# Patient Record
Sex: Female | Born: 1970 | Race: White | Hispanic: No | Marital: Married | Smoking: Never smoker
Health system: Southern US, Community
[De-identification: ages and names within clinical notes are randomized; demographics above are authoritative.]

## PROBLEM LIST (undated history)

## (undated) DIAGNOSIS — T7840XA Allergy, unspecified, initial encounter: Secondary | ICD-10-CM

## (undated) HISTORY — DX: Allergy, unspecified, initial encounter: T78.40XA

---

## 1998-08-30 ENCOUNTER — Other Ambulatory Visit: Admission: RE | Admit: 1998-08-30 | Discharge: 1998-08-30 | Payer: Self-pay | Admitting: *Deleted

## 1998-09-06 ENCOUNTER — Inpatient Hospital Stay (HOSPITAL_COMMUNITY): Admission: AD | Admit: 1998-09-06 | Discharge: 1998-09-06 | Payer: Self-pay | Admitting: Obstetrics & Gynecology

## 1998-11-30 ENCOUNTER — Ambulatory Visit (HOSPITAL_COMMUNITY): Admission: RE | Admit: 1998-11-30 | Discharge: 1998-11-30 | Payer: Self-pay | Admitting: Obstetrics and Gynecology

## 1998-11-30 ENCOUNTER — Encounter: Payer: Self-pay | Admitting: Obstetrics and Gynecology

## 1999-01-14 ENCOUNTER — Inpatient Hospital Stay (HOSPITAL_COMMUNITY): Admission: AD | Admit: 1999-01-14 | Discharge: 1999-01-14 | Payer: Self-pay | Admitting: Obstetrics and Gynecology

## 1999-01-15 ENCOUNTER — Inpatient Hospital Stay (HOSPITAL_COMMUNITY): Admission: AD | Admit: 1999-01-15 | Discharge: 1999-01-15 | Payer: Self-pay | Admitting: Obstetrics and Gynecology

## 1999-01-17 ENCOUNTER — Observation Stay (HOSPITAL_COMMUNITY): Admission: AD | Admit: 1999-01-17 | Discharge: 1999-01-18 | Payer: Self-pay | Admitting: Obstetrics & Gynecology

## 1999-01-21 ENCOUNTER — Encounter (HOSPITAL_COMMUNITY): Admission: RE | Admit: 1999-01-21 | Discharge: 1999-03-11 | Payer: Self-pay | Admitting: Obstetrics and Gynecology

## 1999-01-21 ENCOUNTER — Inpatient Hospital Stay (HOSPITAL_COMMUNITY): Admission: AD | Admit: 1999-01-21 | Discharge: 1999-01-21 | Payer: Self-pay | Admitting: Obstetrics and Gynecology

## 1999-01-30 ENCOUNTER — Encounter: Payer: Self-pay | Admitting: Obstetrics and Gynecology

## 1999-02-12 ENCOUNTER — Encounter: Payer: Self-pay | Admitting: Obstetrics and Gynecology

## 1999-03-09 ENCOUNTER — Inpatient Hospital Stay (HOSPITAL_COMMUNITY): Admission: AD | Admit: 1999-03-09 | Discharge: 1999-03-12 | Payer: Self-pay | Admitting: Obstetrics & Gynecology

## 1999-03-12 ENCOUNTER — Encounter (HOSPITAL_COMMUNITY): Admission: RE | Admit: 1999-03-12 | Discharge: 1999-06-10 | Payer: Self-pay | Admitting: Obstetrics & Gynecology

## 2004-12-22 HISTORY — PX: BREAST REDUCTION SURGERY: SHX8

## 2006-09-10 ENCOUNTER — Other Ambulatory Visit: Admission: RE | Admit: 2006-09-10 | Discharge: 2006-09-10 | Payer: Self-pay | Admitting: Obstetrics and Gynecology

## 2006-10-02 ENCOUNTER — Other Ambulatory Visit: Admission: RE | Admit: 2006-10-02 | Discharge: 2006-10-02 | Payer: Self-pay | Admitting: Obstetrics and Gynecology

## 2015-08-10 ENCOUNTER — Other Ambulatory Visit: Payer: Self-pay | Admitting: Orthopedic Surgery

## 2015-08-10 DIAGNOSIS — M6740 Ganglion, unspecified site: Secondary | ICD-10-CM

## 2015-08-24 ENCOUNTER — Other Ambulatory Visit: Payer: Self-pay

## 2015-09-22 ENCOUNTER — Emergency Department (INDEPENDENT_AMBULATORY_CARE_PROVIDER_SITE_OTHER)
Admission: EM | Admit: 2015-09-22 | Discharge: 2015-09-22 | Disposition: A | Discharge status: Home / Self Care | Payer: BLUE CROSS/BLUE SHIELD | Source: Home / Self Care | Attending: Family Medicine | Admitting: Family Medicine

## 2015-09-22 ENCOUNTER — Encounter (HOSPITAL_COMMUNITY): Payer: Self-pay | Admitting: *Deleted

## 2015-09-22 DIAGNOSIS — S0083XA Contusion of other part of head, initial encounter: Secondary | ICD-10-CM

## 2015-09-22 NOTE — ED Notes (Signed)
Pt    Reports       She  Was  Struck  Head  By  [employer]    While  Doing  Trimming  Today  -      No  Visible  Marks      No  Loss  Of  concoussnesss     No  Vomiting    Pearla    Awake  And  Alert

## 2015-09-22 NOTE — Discharge Instructions (Signed)
Ice, tylenol or advil for soreness as needed, return as needed.

## 2015-09-22 NOTE — ED Provider Notes (Signed)
CSN: [id number]     Arrival date & time 09/22/15  1949 History   First MD Initiated Contact with Patient 09/22/15 2023     Chief Complaint  Patient presents with  . Head Injury   (Consider location/radiation/quality/duration/timing/severity/associated sxs/prior Treatment) Patient is a 44 y.o. female presenting with facial injury.  Facial Injury Mechanism of injury:  Direct blow (trimming limbs and pt let go of pole to catch limb and pole struck side of face, no neuro sx.) Location:  Forehead and L cheek Time since incident:  4 hours Pain details:    Quality:  Dull   Severity:  Mild   Progression:  Unchanged Chronicity:  New Foreign body present:  No foreign bodies Relieved by:  None tried Worsened by:  Nothing tried Ineffective treatments:  None tried Associated symptoms: no double vision, no headaches, no loss of consciousness, no nausea and no vomiting   Risk factors: no concern for non-accidental trauma     History reviewed. No pertinent past medical history. Past Surgical History  Procedure Laterality Date  . Cesarean section     History reviewed. No pertinent family history. Social History  Substance Use Topics  . Smoking status: Never Smoker   . Smokeless tobacco: None  . Alcohol Use: Yes   OB History    No data available     Review of Systems  Constitutional: Negative.   HENT: Negative.   Eyes: Negative for double vision.  Gastrointestinal: Negative for nausea and vomiting.  Skin: Negative for color change, rash and wound.  Neurological: Negative.  Negative for loss of consciousness and headaches.  All other systems reviewed and are negative.   Allergies  Dust mite mixed allergen ext  Home Medications   Prior to Admission medications   Not on File   Meds Ordered and Administered this Visit  Medications - No data to display  BP 121/73 mmHg  Pulse 63  Temp(Src) 98.2 F (36.8 C) (Oral)  Resp 18  SpO2 99%  LMP 09/12/2015 No data  found.   Physical Exam  Constitutional: She is oriented to person, place, and time. She appears well-developed and well-nourished. No distress.  HENT:  Head: Normocephalic and atraumatic.  Right Ear: External ear normal.  Left Ear: External ear normal.  Eyes: Conjunctivae are normal. Pupils are equal, round, and reactive to light.  Neck: Normal range of motion. Neck supple.  Neurological: She is alert and oriented to person, place, and time. She displays normal reflexes. No cranial nerve deficit. Coordination normal.  Skin: Skin is warm and dry. No rash noted. No erythema.  Nursing note and vitals reviewed.   ED Course  Procedures (including critical care time)  Labs Review Labs Reviewed - No data to display  Imaging Review No results found.   Visual Acuity Review  Right Eye Distance:   Left Eye Distance:   Bilateral Distance:    Right Eye Near:   Left Eye Near:    Bilateral Near:         MDM   1. Contusion of forehead, initial encounter        Linna Hoff, MD 09/22/15 2044

## 2016-01-20 ENCOUNTER — Ambulatory Visit (INDEPENDENT_AMBULATORY_CARE_PROVIDER_SITE_OTHER): Payer: BLUE CROSS/BLUE SHIELD | Admitting: Family Medicine

## 2016-01-20 VITALS — BP 120/80 | HR 77 | Temp 98.3°F | Resp 17 | Ht 63.39 in | Wt 139.0 lb

## 2016-01-20 DIAGNOSIS — J0140 Acute pansinusitis, unspecified: Secondary | ICD-10-CM

## 2016-01-20 DIAGNOSIS — J309 Allergic rhinitis, unspecified: Secondary | ICD-10-CM | POA: Diagnosis not present

## 2016-01-20 DIAGNOSIS — R05 Cough: Secondary | ICD-10-CM

## 2016-01-20 DIAGNOSIS — R059 Cough, unspecified: Secondary | ICD-10-CM

## 2016-01-20 MED ORDER — AMOXICILLIN-POT CLAVULANATE 875-125 MG PO TABS: 1.0000 | ORAL_TABLET | Freq: Two times a day (BID) | ORAL | Status: DC

## 2016-01-20 NOTE — Patient Instructions (Signed)

## 2016-01-20 NOTE — Progress Notes (Signed)
      Chief Complaint:  Chief Complaint  Patient presents with  . Sinusitis    HPI: Ashley Brandt is a 45 y.o. female who reports to [facility] today complaining of sinusitis sxs.  Has allergies. No fevers chills, CP or SOB  Has tried otc meds without releif.   Past Medical History  Diagnosis Date  . Allergy    Past Surgical History  Procedure Laterality Date  . Cesarean section     Social History   Social History  . Marital Status: Married    Spouse Name: N/A  . Number of Children: N/A  . Years of Education: N/A   Social History Main Topics  . Smoking status: Never Smoker   . Smokeless tobacco: None  . Alcohol Use: Yes  . Drug Use: No  . Sexual Activity: No   Other Topics Concern  . None   Social History Narrative   History reviewed. No pertinent family history. Allergies  Allergen Reactions  . Dust Mite Mixed Allergen Ext [Mite (D. Farinae)]    Prior to Admission medications   Not on File     ROS: The patient denies fevers, chills, night sweats, unintentional weight loss, chest pain, palpitations, wheezing, dyspnea on exertion, nausea, vomiting, abdominal pain, dysuria, hematuria, melena, numbness, weakness, or tingling.   All other systems have been reviewed and were otherwise negative with the exception of those mentioned in the HPI and as above.    PHYSICAL EXAM: Filed Vitals:   01/20/16 1128  BP: 120/80  Pulse: 77  Temp: 98.3 F (36.8 C)  Resp: 17   Body mass index is 24.32 kg/(m^2).   General: Alert, no acute distress HEENT:  Normocephalic, atraumatic, oropharynx patent. EOMI, PERRLA Erythematous throat, no exudates, TM normal, + sinus tenderness, + erythematous/boggy nasal mucosa Cardiovascular:  Regular rate and rhythm, no rubs murmurs or gallops.  No Carotid bruits, radial pulse intact. No pedal edema.  Respiratory: Clear to auscultation bilaterally.  No wheezes, rales, or rhonchi.  No cyanosis, no use of accessory  musculature Abdominal: No organomegaly, abdomen is soft and non-tender, positive bowel sounds. No masses. Skin: No rashes. Neurologic: Facial musculature symmetric. Psychiatric: Patient acts appropriately throughout our interaction. Lymphatic: No cervical or submandibular lymphadenopathy Musculoskeletal: Gait intact. No edema, tenderness   LABS: No results found for this or any previous visit.   EKG/XRAY:   Primary read interpreted by Dr. Conley Rolls at [facility].   ASSESSMENT/PLAN: Encounter Diagnoses  Name Primary?  . Allergic rhinitis, unspecified allergic rhinitis type   . Acute pansinusitis, recurrence not specified Yes  . Cough    otc cough meds Rx augmentin, fu prn   Gross sideeffects, risk and benefits, and alternatives of medications d/w patient. Patient is aware that all medications have potential sideeffects and we are unable to predict every sideeffect or drug-drug interaction that may occur.  Regan Llorente DO  01/20/2016 1:40 PM

## 2020-02-14 ENCOUNTER — Other Ambulatory Visit: Payer: Self-pay

## 2020-02-14 ENCOUNTER — Ambulatory Visit (INDEPENDENT_AMBULATORY_CARE_PROVIDER_SITE_OTHER): Payer: BC Managed Care – PPO | Admitting: Allergy & Immunology

## 2020-02-14 ENCOUNTER — Encounter: Payer: Self-pay | Admitting: Allergy & Immunology

## 2020-02-14 VITALS — BP 114/62 | HR 69 | Temp 97.9°F | Resp 18 | Ht 63.0 in | Wt 148.2 lb

## 2020-02-14 DIAGNOSIS — R059 Cough, unspecified: Secondary | ICD-10-CM

## 2020-02-14 DIAGNOSIS — R05 Cough: Secondary | ICD-10-CM | POA: Diagnosis not present

## 2020-02-14 DIAGNOSIS — K9049 Malabsorption due to intolerance, not elsewhere classified: Secondary | ICD-10-CM

## 2020-02-14 DIAGNOSIS — J302 Other seasonal allergic rhinitis: Secondary | ICD-10-CM | POA: Diagnosis not present

## 2020-02-14 DIAGNOSIS — J3089 Other allergic rhinitis: Secondary | ICD-10-CM | POA: Diagnosis not present

## 2020-02-14 MED ORDER — ALBUTEROL SULFATE HFA 108 (90 BASE) MCG/ACT IN AERS: 2.0000 | INHALATION_SPRAY | Freq: Four times a day (QID) | RESPIRATORY_TRACT | 1 refills | Status: DC | PRN

## 2020-02-14 MED ORDER — AZELASTINE HCL 0.1 % NA SOLN: 2.0000 | Freq: Two times a day (BID) | NASAL | 2 refills | Status: DC | PRN

## 2020-02-14 NOTE — Progress Notes (Signed)
NEW PATIENT  Date of Service/Encounter:  02/14/20  Referring provider: Tally Joe, MD   Assessment:   Cough - likely secondary to postnasal drip  Seasonal and perennial allergic rhinitis (ragweed, weeds, trees and cockroach)  Food intolerance - with negative testing to the most common foods  Plan/Recommendations:   1. Cough - Spirometry looked great today. - I think a lot of your coughing is from postnasal drip. - You can continue to use albuterol 2-4 puffs every 4 hours as needed.  - We can consider adding on a daily asthma medication in the future, if needed. - But we are going to be aggressive with the postnasal drip at this time to see if this helps.   2. Seasonal and perennial allergic rhinitis - Testing today showed: ragweed, weeds, trees and cockroach - Copy of test results provided.  - Avoidance measures provided. - Stop taking: Claritin - Continue with: Flonase (fluticasone) two sprays per nostril daily - Start taking: Xyzal (levocetirizine) 5mg  tablet once daily and Astelin (azelastine) 2 sprays per nostril 1-2 times daily as needed - You can use an extra dose of the antihistamine, if needed, for breakthrough symptoms.  - Consider nasal saline rinses 1-2 times daily to remove allergens from the nasal cavities as well as help with mucous clearance (this is especially helpful to do before the nasal sprays are given) - Consider allergy shots as a means of long-term control. - Allergy shots "re-train" and "reset" the immune system to ignore environmental allergens and decrease the resulting immune response to those allergens (sneezing, itchy watery eyes, runny nose, nasal congestion, etc).    - Allergy shots improve symptoms in 75-85% of patients.  - We can discuss more at the next appointment if the medications are not wor king for you.  3. Food intolerance - Testing to the most common foods was completely negative. - There is a the low positive predictive value  of food allergy testing and hence the high possibility of false positives. - In contrast, food allergy testing has a high negative predictive value, therefore if testing is negative we can be relatively assured that they are indeed negative.  - You can avoid the grains since you feel better without them, but you do not have to worry about needing an EpiPen.   4. Return in about 3 months (around 05/13/2020). This can be an in-person, a virtual Webex or a telephone follow up visit.  Subjective:   Ashley Brandt is a 49 y.o. female presenting today for evaluation of  Chief Complaint  Patient presents with  . Food Intolerance    unknown source  . Allergic Rhinitis     Molds, DM  . Cough    during the winter times     Ashley Brandt has a history of the following: Patient Active Problem List   Diagnosis Date Noted  . Seasonal and perennial allergic rhinitis 02/14/2020  . Food intolerance 02/14/2020    History obtained from: chart review and patient.  02/16/2020 Ashley Brandt was referred by Gar Ponto, MD.     Ashley Brandt is a 49 y.o. female presenting for an evaluation of allergies and asthma.   Asthma/Respiratory Symptom History: She has never been diagnosed with asthma, although her dad was diagnosed with it. She has never needed an inhaler for asthma, but she has gotten in every winter for "bronchitis". This is an annual episode and she gets albuterol for this.   Allergic Rhinitis Symptom History: She was  on shots for years when she was a child. She has taken Zyrtec for years. She is now on the Claritin. Prior to this, she was on the Chlortrimetron. She stopped the Claritin before this appointment and the first day that she stopped, she apparently developed a lot of eye watering. She has never been on Allegra or Xyzal. She is on a nose spray and started fluticasone last month. She is not using eye drops at all. Her last testing was done when she was very young.   Food Allergy  Symptom History: She reports that she was tested often for foods when she was a kid. She reports that she had a lot of "mild allergies". She was then tested 15 years ago and nothing "jumped off the charts". She thinks that she was positive to wheat. She tells me that 6 years ago, she cut out grains as well as gluten. When the pandemic hit, she added the grains back into her diet. She reports that leading a gluten free diet leads to less stomach pain and weight gain. She does not have an EpiPen at all.   Otherwise, there is no history of other atopic diseases, including drug allergies, stinging insect allergies, eczema, urticaria or contact dermatitis. There is no significant infectious history. Vaccinations are up to date.    Past Medical History: Patient Active Problem List   Diagnosis Date Noted  . Seasonal and perennial allergic rhinitis 02/14/2020  . Food intolerance 02/14/2020    Medication List:  Allergies as of 02/14/2020      Reactions   Dust Mite Mixed Allergen Ext [mite (d. Farinae)]       Medication List       Accurate as of February 14, 2020 10:43 PM. If you have any questions, ask your nurse or doctor.        STOP taking these medications   amoxicillin-clavulanate 875-125 MG tablet Commonly known as: AUGMENTIN Stopped by: Alfonse Spruce, MD     TAKE these medications   albuterol 108 (90 Base) MCG/ACT inhaler Commonly known as: VENTOLIN HFA Inhale 2 puffs into the lungs every 6 (six) hours as needed for wheezing or shortness of breath. Started by: Alfonse Spruce, MD   azelastine 0.1 % nasal spray Commonly known as: ASTELIN Place 2 sprays into both nostrils 2 (two) times daily as needed for rhinitis. Started by: Alfonse Spruce, MD   cholecalciferol 25 MCG (1000 UNIT) tablet Commonly known as: VITAMIN D3 Take 5,000 Units by mouth daily.   Fish Oil 1000 MG Caps Take 3,600 mg by mouth daily.   fluticasone 50 MCG/ACT nasal spray Commonly known  as: FLONASE Place 2 sprays into both nostrils daily.   loratadine 10 MG tablet Commonly known as: CLARITIN Take 10 mg by mouth daily.   PROBIOTIC-10 PO Take 1 tablet by mouth daily.       Birth History: non-contributory  Developmental History: non-contributory  Past Surgical History: Past Surgical History:  Procedure Laterality Date  . CESAREAN SECTION       Family History: Family History  Problem Relation Age of Onset  . Diabetes Mellitus II Mother   . Heart Problems Father      Social History: Hilarie lives at home with her family.  She lives in a house that is over 8 years old.  There is some mold in the basement.  There is wood floor throughout the home.  They have gas heating and central cooling.  There are 2 cats  inside of the house.  She does not have dust mite covers on her bedding.  Occasionally there are roaches.  There is no tobacco exposure.  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  Currently she is working at [employer].  Her husband sells insurance.   Review of Systems  Constitutional: Negative.  Negative for chills, fever, malaise/fatigue and weight loss.  HENT: Negative.  Negative for congestion, ear discharge, ear pain, sinus pain and sore throat.   Eyes: Negative for pain, discharge and redness.  Respiratory: Negative for cough, sputum production, shortness of breath and wheezing.   Cardiovascular: Negative.  Negative for chest pain and palpitations.  Gastrointestinal: Negative for abdominal pain, constipation, diarrhea, heartburn, nausea and vomiting.  Skin: Negative.  Negative for itching and rash.  Neurological: Negative for dizziness and headaches.  Endo/Heme/Allergies: Positive for environmental allergies. Does not bruise/bleed easily.       Objective:   Blood pressure 114/62, pulse 69, temperature 97.9 F (36.6 C), temperature source Temporal, resp. rate 18, height 5\' 3"  (1.6 m), weight 148 lb 3.2 oz (67.2 kg), SpO2 99  %. Body mass index is 26.25 kg/m.   Physical Exam:   Physical Exam  Constitutional: She appears well-developed.  HENT:  Head: Normocephalic and atraumatic.  Right Ear: Tympanic membrane, external ear and ear canal normal. No drainage, swelling or tenderness. Tympanic membrane is not injected, not scarred, not erythematous, not retracted and not bulging.  Left Ear: Tympanic membrane, external ear and ear canal normal. No drainage, swelling or tenderness. Tympanic membrane is not injected, not scarred, not erythematous, not retracted and not bulging.  Nose: No mucosal edema, rhinorrhea, nasal deformity or septal deviation. No epistaxis. Right sinus exhibits no maxillary sinus tenderness and no frontal sinus tenderness. Left sinus exhibits no maxillary sinus tenderness and no frontal sinus tenderness.  Mouth/Throat: Uvula is midline and oropharynx is clear and moist. Mucous membranes are not pale and not dry.  Turbinates enlarged bilaterally without discharge. No polyps appreciated.   Eyes: Pupils are equal, round, and reactive to light. Conjunctivae and EOM are normal. Right eye exhibits no chemosis and no discharge. Left eye exhibits no chemosis and no discharge. Right conjunctiva is not injected. Left conjunctiva is not injected.  Cardiovascular: Normal rate, regular rhythm and normal heart sounds.  Respiratory: Effort normal and breath sounds normal. No accessory muscle usage. No tachypnea. No respiratory distress. She has no wheezes. She has no rhonchi. She has no rales. She exhibits no tenderness.  Moving air well in all lung fields. No increased work of breathing noted.   GI: There is no abdominal tenderness. There is no rebound and no guarding.  Lymphadenopathy:       Head (right side): No submandibular, no tonsillar and no occipital adenopathy present.       Head (left side): No submandibular, no tonsillar and no occipital adenopathy present.    She has no cervical adenopathy.   Neurological: She is alert.  Skin: No abrasion, no petechiae and no rash noted. Rash is not papular, not vesicular and not urticarial. No erythema. No pallor.  No urticarial or eczematous lesions noted.   Psychiatric: She has a normal mood and affect.     Diagnostic studies:    Spirometry: results normal (FEV1: 2.80/103%, FVC: 4.08/119%, FEV1/FVC: 69%).    Spirometry consistent with normal pattern.   Allergy Studies:    Airborne Adult Perc - 02/14/20 1413    Time Antigen Placed  1413  Allergen Manufacturer  Greer    Location  Back    Number of Test  59    Panel 1  Select    1. Control-Buffer 50% Glycerol  Negative    2. Control-Histamine 1 mg/ml  2+    3. Albumin saline  Negative    4. Bahia  Negative    5. [place] Southern Territ[place]  Negative    6. Johnson  Negative    7. Kentucky Blue  Negative    8. Meadow Fescue  Negative    9. Perennial Rye  Negative    10. Sweet Vernal  Negative    11. Timothy  Negative    12. Cocklebur  Negative    13. Burweed Marshelder  Negative    14. Ragweed, short  4+    15. Ragweed, Giant  Negative    16. Plantain,  English  Negative    17. Lamb's Quarters  Negative    18. Sheep Sorrell  Negative    19. Rough Pigweed  Negative    20. Marsh Elder, Rough  Negative    21. Mugwort, Common  2+    22. Ash mix  Negative    23. Birch mix  2+    24. Beech American  Negative    25. Box, Elder  Negative    26. Cedar, red  Negative    27. Cottonwood, [place]  Negative    28. Elm mix  Negative    29. Hickory mix  3+    30. Maple mix  Negative    31. Oak, [place] mix  Negative    32. Pecan Pollen  2+    33. Pine mix  Negative    34. Sycamore Eastern  Negative    35. Walnut, Black Pollen  Negative    36. Alternaria alternata  Negative    37. Cladosporium Herbarum  Negative    38. Aspergillus mix  Negative    39. Penicillium mix  Negative    40. Bipolaris sorokiniana (Helminthosporium)  Negative    41. Drechslera spicifera (Curvularia)  Negative    42.  Mucor plumbeus  Negative    43. Fusarium moniliforme  Negative    44. Aureobasidium pullulans (pullulara)  Negative    45. Rhizopus oryzae  Negative    46. Botrytis cinera  Negative    47. Epicoccum nigrum  Negative    48. Phoma betae  Negative    49. Candida Albicans  Negative    50. Trichophyton mentagrophytes  Negative    51. Mite, D Farinae  5,000 AU/ml  Negative    52. Mite, D Pteronyssinus  5,000 AU/ml  Negative    53. Cat Hair 10,000 BAU/ml  Negative    54.  Dog Epithelia  Negative    55. Mixed Feathers  Negative    56. Horse Epithelia  Negative    57. Cockroach, German  2+    58. Mouse  Negative    59. Tobacco Leaf  Negative     Food Perc - 02/14/20 1414    Time Antigen Placed  1414    Allergen Manufacturer  Waynette Buttery    Location  Back    Number of allergen test  10    Food  Select    1. Peanut  Negative    2. Soybean food  Negative    3. Wheat, whole  Negative    4. Sesame  Negative    5. Milk, cow  Negative    6. Egg  White, chicken  Negative    7. Casein  Negative    8. Shellfish mix  Negative    9. Fish mix  Negative    10. Cashew  Negative     Intradermal - 02/14/20 1455    Time Antigen Placed  1455    Allergen Manufacturer  Lavella Hammock    Location  Arm    Number of Test  11    Intradermal  Select    Control  Negative     [place]  Negative     Johnson  Negative     7 Grass  Negative     Mold 1  Negative     Mold 2  Negative     Mold 3  Negative     Mold 4  Negative     Cat  Negative     Dog  Negative     Mite mix  Negative      Allergy testing results were read and interpreted by myself, documented by clinical staff.         Salvatore Marvel, MD Allergy and [place] [facility] [place]

## 2020-02-14 NOTE — Patient Instructions (Addendum)
1. Cough - Spirometry looked great today. - I think a lot of your coughing is from postnasal drip. - You can continue to use albuterol 2-4 puffs every 4 hours as needed.  - We can consider adding on a daily asthma medication in the future, if needed. - But we are going to be aggressive with the postnasal drip at this time to see if this helps.   2. Seasonal and perennial allergic rhinitis - Testing today showed: ragweed, weeds, trees and cockroach - Copy of test results provided.  - Avoidance measures provided. - Stop taking: Claritin - Continue with: Flonase (fluticasone) two sprays per nostril daily - Start taking: Xyzal (levocetirizine) 5mg  tablet once daily and Astelin (azelastine) 2 sprays per nostril 1-2 times daily as needed - You can use an extra dose of the antihistamine, if needed, for breakthrough symptoms.  - Consider nasal saline rinses 1-2 times daily to remove allergens from the nasal cavities as well as help with mucous clearance (this is especially helpful to do before the nasal sprays are given) - Consider allergy shots as a means of long-term control. - Allergy shots "re-train" and "reset" the immune system to ignore environmental allergens and decrease the resulting immune response to those allergens (sneezing, itchy watery eyes, runny nose, nasal congestion, etc).    - Allergy shots improve symptoms in 75-85% of patients.  - We can discuss more at the next appointment if the medications are not wor king for you.  3. Food intolerance - Testing to the most common foods was completely negative. - There is a the low positive predictive value of food allergy testing and hence the high possibility of false positives. - In contrast, food allergy testing has a high negative predictive value, therefore if testing is negative we can be relatively assured that they are indeed negative.  - You can avoid the grains since you feel better without them, but you do not have to worry  about needing an EpiPen.   4. Return in about 3 months (around 05/13/2020). This can be an in-person, a virtual Webex or a telephone follow up visit.   Please inform 05/15/2020 of any Emergency Department visits, hospitalizations, or changes in symptoms. Call us before going to the ED for breathing or allergy symptoms since we might be able to fit you in for a sick visit. Feel free to contact us anytime with any questions, problems, or concerns.  It was a pleasure to meet you today!  Websites that have reliable patient information: 1. American Academy of Asthma, Allergy, and Immunology: www.aaaai.org 2. Food Allergy Research and Education (FARE): foodallergy.org 3. Mothers of Asthmatics: http://www.asthmacommunitynetwork.org 4. American College of Allergy, Asthma, and Immunology: www.acaai.org   COVID-19 Vaccine Information can be found at: [place] For questions related to vaccine distribution or appointments, please email [email or web address] or call [phone].     "Like" [phone] on Facebook and Instagram for our latest updates!        Make sure you are registered to vote! If you have moved or changed any of your contact information, you will need to get this updated before voting!  In some cases, you MAY be able to register to vote online: [place]    Reducing Pollen Exposure  The American Academy of Allergy, Asthma and Immunology suggests the following steps to reduce your exposure to pollen during allergy seasons.    1. Do not hang sheets or clothing out to dry; pollen may collect on these items. 2. Do not  mow lawns or spend time around freshly cut grass; mowing stirs up pollen. 3. Keep windows closed at night.  Keep car windows closed while driving. 4. Minimize morning activities outdoors, a time when pollen counts are usually at their highest. 5. Stay indoors as much as  possible when pollen counts or humidity is high and on windy days when pollen tends to remain in the air longer. 6. Use air conditioning when possible.  Many air conditioners have filters that trap the pollen spores. 7. Use a HEPA room air filter to remove pollen form the indoor air you breathe.  Control of Cockroach Allergen  Cockroach allergen has been identified as an important cause of acute attacks of asthma, especially in urban settings.  There are fifty-five species of cockroach that exist in the [place], however only three, the [place], [place] species produce allergen that can affect patients with Asthma.  Allergens can be obtained from fecal particles, egg casings and secretions from cockroaches.    1. Remove food sources. 2. Reduce access to water. 3. Seal access and entry points. 4. Spray runways with 0.5-1% Diazinon or Chlorpyrifos 5. Blow boric acid power under stoves and refrigerator. 6. Place bait stations (hydramethylnon) at feeding sites.

## 2020-05-15 ENCOUNTER — Encounter: Payer: Self-pay | Admitting: Allergy & Immunology

## 2020-05-15 ENCOUNTER — Ambulatory Visit (INDEPENDENT_AMBULATORY_CARE_PROVIDER_SITE_OTHER): Payer: BC Managed Care – PPO | Admitting: Allergy & Immunology

## 2020-05-15 ENCOUNTER — Other Ambulatory Visit: Payer: Self-pay

## 2020-05-15 VITALS — HR 65 | Temp 97.9°F | Resp 19 | Ht 62.6 in | Wt 146.9 lb

## 2020-05-15 DIAGNOSIS — J302 Other seasonal allergic rhinitis: Secondary | ICD-10-CM

## 2020-05-15 DIAGNOSIS — J3089 Other allergic rhinitis: Secondary | ICD-10-CM | POA: Diagnosis not present

## 2020-05-15 MED ORDER — AZELASTINE HCL 0.1 % NA SOLN: 2.0000 | Freq: Two times a day (BID) | NASAL | 5 refills | Status: DC | PRN

## 2020-05-15 MED ORDER — LEVOCETIRIZINE DIHYDROCHLORIDE 5 MG PO TABS: 5.0000 mg | ORAL_TABLET | Freq: Every evening | ORAL | 5 refills | Status: DC

## 2020-05-15 MED ORDER — FLUTICASONE PROPIONATE 50 MCG/ACT NA SUSP: 2.0000 | Freq: Every day | NASAL | 5 refills | Status: DC | PRN

## 2020-05-15 NOTE — Progress Notes (Signed)
FOLLOW UP  Date of Service/Encounter:  05/15/20   Assessment:   Cough - likely secondary to postnasal drip  Seasonal and perennial allergic rhinitis (ragweed, weeds, trees and cockroach)  Food intolerance - with negative testing to the most common foods  Plan/Recommendations:   1. Seasonal and perennial allergic rhinitis (ragweed, weeds, trees and cockroach) - Continue with: Flonase (fluticasone) two sprays per nostril daily as needed + Astelin (azelastine) 2 sprays per nostril 1-2 times daily as needed + Xyzal (levocetirizine) 5mg  tablet once daily - Consider alternating the antihistamines every few months. - Add on the nose sprays when the cough returns for 1-2 weeks. - Consider allergen immunotherapy.   - CPT codes provided.   2. Return in about 1 year (around 05/15/2021), or if symptoms worsen or fail to improve.    Subjective:   Ashley Brandt is a 49 y.o. female presenting today for follow up of  Chief Complaint  Patient presents with  . Cough    Ashley Brandt has a history of the following: Patient Active Problem List   Diagnosis Date Noted  . Seasonal and perennial allergic rhinitis 02/14/2020  . Food intolerance 02/14/2020    History obtained from: chart review and patient.  Ashley Brandt is a 49 y.o. female presenting for a follow up visit. She was last seen in February 2021 as a New Patient for evaluation of cough. At that time, her spiro looked great. We felt that her symptoms were secondary to postnasal drip.   Since the last visit, she has done well. She feels that the Xyzal is doing well. She is not using her nose spray often at all.  This time of the year tends to be a little better for her anyway.  She thinks she is doing fine without the use of allergy shots, but she would like to talk about it a little bit more today.  She has not required any antibiotics or steroids since last visit.  Otherwise, there have been no changes to her past medical  history, surgical history, family history, or social history.    Review of Systems  Constitutional: Negative.  Negative for chills, fever, malaise/fatigue and weight loss.  HENT: Positive for sinus pain. Negative for congestion, ear discharge and ear pain.   Eyes: Negative for pain, discharge and redness.  Respiratory: Negative for cough, sputum production, shortness of breath and wheezing.   Cardiovascular: Negative.  Negative for chest pain and palpitations.  Gastrointestinal: Negative for abdominal pain, constipation, diarrhea, heartburn, nausea and vomiting.  Skin: Negative.  Negative for itching and rash.  Neurological: Negative for dizziness and headaches.  Endo/Heme/Allergies: Negative for environmental allergies. Does not bruise/bleed easily.       Objective:   Pulse 65, temperature 97.9 F (36.6 C), temperature source Temporal, resp. rate 19, height 5' 2.6" (1.59 m), weight 146 lb 14.4 oz (66.6 kg), SpO2 97 %. Body mass index is 26.36 kg/m.   Physical Exam:  Physical Exam  Constitutional: She appears well-developed and well-nourished.  Very friendly.  HENT:  Head: Normocephalic and atraumatic.  Right Ear: Tympanic membrane, external ear and ear canal normal.  Left Ear: Tympanic membrane, external ear and ear canal normal.  Nose: Mucosal edema and rhinorrhea present. No nasal deformity or septal deviation. No epistaxis. Right sinus exhibits no maxillary sinus tenderness and no frontal sinus tenderness. Left sinus exhibits no maxillary sinus tenderness and no frontal sinus tenderness.  Mouth/Throat: Uvula is midline and oropharynx is clear and moist.  Mucous membranes are not pale and not dry.  Eyes: Pupils are equal, round, and reactive to light. Conjunctivae and EOM are normal. Right eye exhibits no chemosis and no discharge. Left eye exhibits no chemosis and no discharge. Right conjunctiva is not injected. Left conjunctiva is not injected.  Cardiovascular: Normal rate,  regular rhythm and normal heart sounds.  Respiratory: Effort normal and breath sounds normal. No accessory muscle usage. No tachypnea. No respiratory distress. She has no wheezes. She has no rhonchi. She has no rales. She exhibits no tenderness.  Moving air well in all lung fields.  Lymphadenopathy:    She has no cervical adenopathy.  Neurological: She is alert.  Skin: No abrasion, no petechiae and no rash noted. Rash is not papular, not vesicular and not urticarial. No erythema. No pallor.  No eczematous or urticarial lesions noted.  Psychiatric: She has a normal mood and affect.     Diagnostic studies: none      Malachi Bonds, MD  [facility]

## 2020-05-15 NOTE — Patient Instructions (Addendum)
1. Seasonal and perennial allergic rhinitis (ragweed, weeds, trees and cockroach) - Continue with: Flonase (fluticasone) two sprays per nostril daily as needed + Astelin (azelastine) 2 sprays per nostril 1-2 times daily as needed + Xyzal (levocetirizine) 5mg  tablet once daily - Consider alternating the antihistamines every few months. - Add on the nose sprays when the cough returns for 1-2 weeks. - Consider allergen immunotherapy.   - CPT codes provided.   2. Return in about 1 year (around 05/15/2021), or if symptoms worsen or fail to improve.   Please inform 05/17/2021 of any Emergency Department visits, hospitalizations, or changes in symptoms. Call us before going to the ED for breathing or allergy symptoms since we might be able to fit you in for a sick visit. Feel free to contact us anytime with any questions, problems, or concerns.  It was a pleasure to see you again today!  Websites that have reliable patient information: 1. American Academy of Asthma, Allergy, and Immunology: www.aaaai.org 2. Food Allergy Research and Education (FARE): foodallergy.org 3. Mothers of Asthmatics: http://www.asthmacommunitynetwork.org 4. American College of Allergy, Asthma, and Immunology: www.acaai.org   COVID-19 Vaccine Information can be found at: [place] For questions related to vaccine distribution or appointments, please email [email or web address] or call [phone].     "Like" [phone] on Facebook and Instagram for our latest updates!       HAPPY SPRING!  Make sure you are registered to vote! If you have moved or changed any of your contact information, you will need to get this updated before voting!  In some cases, you MAY be able to register to vote online: [place]

## 2020-11-12 ENCOUNTER — Ambulatory Visit (INDEPENDENT_AMBULATORY_CARE_PROVIDER_SITE_OTHER): Payer: BC Managed Care – PPO

## 2020-11-12 ENCOUNTER — Other Ambulatory Visit: Payer: Self-pay

## 2020-11-12 ENCOUNTER — Ambulatory Visit (INDEPENDENT_AMBULATORY_CARE_PROVIDER_SITE_OTHER): Payer: BC Managed Care – PPO | Admitting: Podiatry

## 2020-11-12 DIAGNOSIS — S9031XA Contusion of right foot, initial encounter: Secondary | ICD-10-CM

## 2020-11-12 DIAGNOSIS — G5761 Lesion of plantar nerve, right lower limb: Secondary | ICD-10-CM | POA: Diagnosis not present

## 2020-11-20 MED ORDER — METHYLPREDNISOLONE 4 MG PO TBPK
ORAL_TABLET | ORAL | 0 refills | Status: DC
Start: 2020-11-20 — End: 2021-06-11

## 2020-11-20 NOTE — Progress Notes (Signed)
   HPI: 49 y.o. female presenting today for evaluation of right forefoot pain this been going on approximately 1-2 years now.  Patient has been seen by different physicians and diagnosed with Morton's neuroma in the past.  She is received anti-inflammatory injections in the past which only helped temporarily.  She is also been seen by physical therapist who provides foot exercises.  She is very active however she has had pain to the right forefoot which limits her activity significantly.  Patient experiences numbness and pain to the forefoot.  She is also tried different shoe gear which has not helped alleviate her symptoms.  Past Medical History:  Diagnosis Date  . Allergy      Physical Exam: General: The patient is alert and oriented x3 in no acute distress.  Dermatology: Skin is warm, dry and supple bilateral lower extremities. Negative for open lesions or macerations.  Vascular: Palpable pedal pulses bilaterally. No edema or erythema noted. Capillary refill within normal limits.  Neurological: Epicritic and protective threshold grossly intact bilaterally.   Musculoskeletal Exam: Range of motion within normal limits to all pedal and ankle joints bilateral. Muscle strength 5/5 in all groups bilateral.  Pain on palpation to the second and third interspaces of the right forefoot.  Pain with lateral compression of the metatarsal heads.  Positive Mulder sign.  Findings suggestive of Morton's neuroma second and third interspace right foot  Radiographic Exam:  Normal osseous mineralization. Joint spaces preserved. No fracture/dislocation/boney destruction.    Assessment: 1.  Morton's neuroma second and third interspace right foot   Plan of Care:  1. Patient evaluated. X-Rays reviewed.  2. Today we discussed the conservative versus surgical management of the presenting pathology. The patient opts for surgical management. All possible complications and details of the procedure were explained.  All patient questions were answered. No guarantees were expressed or implied. 3. Authorization for surgery was initiated today. Surgery will consist of excision of Morton's neuroma second and third interspace right foot 4.  Patient is going to [facility] 12/15-12/20.  Prescription for Medrol Dosepak sent to pharmacy to help alleviate some of her symptoms if she needs it while she is on her trip 5.  Return to clinic 1 week postop  *Regular jeans designer.  Loves to snowboard.  [place]        Felecia Shelling, DPM [facility]  Dr. Felecia Shelling, DPM    [address] [place], [place] [address]                Office [phone]  Fax [phone]

## 2020-11-26 ENCOUNTER — Telehealth: Payer: Self-pay | Admitting: Podiatry

## 2020-11-26 NOTE — Telephone Encounter (Signed)
DOS: 12/20/2020  Procedure: Neurectomy 2nd and 3rd Interspace Rt (28080)  BCBS Effective From 12/23/2019 - 12/21/2020  Deductible: $7,000 with $2,505.39 met and $5,183.63 remaining. Out of Pocket: $7,000 with $1,816.37 met and $5,183.63 remaining. CoInsurance: 100% Copay: $0  Per [employer] no Prior Authorization is required.

## 2020-12-25 ENCOUNTER — Other Ambulatory Visit: Payer: Self-pay

## 2020-12-25 DIAGNOSIS — Z20822 Contact with and (suspected) exposure to covid-19: Secondary | ICD-10-CM

## 2020-12-26 ENCOUNTER — Encounter: Payer: BC Managed Care – PPO | Admitting: Podiatry

## 2020-12-26 LAB — SARS-COV-2, NAA 2 DAY TAT

## 2020-12-26 LAB — NOVEL CORONAVIRUS, NAA: SARS-CoV-2, NAA: NOT DETECTED

## 2021-01-02 ENCOUNTER — Encounter: Payer: BC Managed Care – PPO | Admitting: Podiatry

## 2021-01-16 ENCOUNTER — Encounter: Payer: BC Managed Care – PPO | Admitting: Podiatry

## 2021-01-21 ENCOUNTER — Telehealth: Payer: Self-pay

## 2021-01-21 NOTE — Telephone Encounter (Signed)
DOS 01/24/2021  NEURECTOMY 2ND B/L - 28080  RECEIVED FAX FROM [facility]. NO AUTH REQUIRED FOR CPT 28080. REF # [id number]

## 2021-01-24 ENCOUNTER — Other Ambulatory Visit: Payer: Self-pay | Admitting: Podiatry

## 2021-01-24 DIAGNOSIS — G5761 Lesion of plantar nerve, right lower limb: Secondary | ICD-10-CM

## 2021-01-24 MED ORDER — OXYCODONE-ACETAMINOPHEN 5-325 MG PO TABS: 1.0000 | ORAL_TABLET | ORAL | 0 refills | Status: DC | PRN

## 2021-01-24 MED ORDER — IBUPROFEN 800 MG PO TABS
800.0000 mg | ORAL_TABLET | Freq: Three times a day (TID) | ORAL | 1 refills | Status: DC
Start: 2021-01-24 — End: 2022-06-17

## 2021-01-24 NOTE — Progress Notes (Signed)
PRN postop 

## 2021-01-25 ENCOUNTER — Telehealth: Payer: Self-pay | Admitting: Podiatry

## 2021-01-25 NOTE — Telephone Encounter (Signed)
Pain is completely normal given that she has surgery on the foot.

## 2021-01-25 NOTE — Telephone Encounter (Signed)
Pt states she is in a lot of pain, and can't walk on her surgical foot. She's concerned if everything is okay, and if the pain is normal. Please advise.

## 2021-01-30 ENCOUNTER — Other Ambulatory Visit: Payer: Self-pay

## 2021-01-30 ENCOUNTER — Ambulatory Visit (INDEPENDENT_AMBULATORY_CARE_PROVIDER_SITE_OTHER): Payer: 59 | Admitting: Podiatry

## 2021-01-30 ENCOUNTER — Encounter: Payer: BC Managed Care – PPO | Admitting: Podiatry

## 2021-01-30 DIAGNOSIS — Z9889 Other specified postprocedural states: Secondary | ICD-10-CM

## 2021-01-30 DIAGNOSIS — G5761 Lesion of plantar nerve, right lower limb: Secondary | ICD-10-CM

## 2021-01-31 ENCOUNTER — Encounter: Payer: Self-pay | Admitting: Podiatry

## 2021-02-06 ENCOUNTER — Ambulatory Visit (INDEPENDENT_AMBULATORY_CARE_PROVIDER_SITE_OTHER): Payer: 59 | Admitting: Podiatry

## 2021-02-06 ENCOUNTER — Other Ambulatory Visit: Payer: Self-pay

## 2021-02-06 DIAGNOSIS — Z9889 Other specified postprocedural states: Secondary | ICD-10-CM

## 2021-02-06 DIAGNOSIS — G5761 Lesion of plantar nerve, right lower limb: Secondary | ICD-10-CM

## 2021-02-12 ENCOUNTER — Other Ambulatory Visit: Payer: Self-pay | Admitting: Podiatry

## 2021-02-12 ENCOUNTER — Telehealth: Payer: Self-pay | Admitting: Podiatry

## 2021-02-12 MED ORDER — PREGABALIN 75 MG PO CAPS
75.0000 mg | ORAL_CAPSULE | Freq: Two times a day (BID) | ORAL | 0 refills | Status: DC
Start: 2021-02-12 — End: 2021-03-18

## 2021-02-12 NOTE — Progress Notes (Signed)
PRN postop 

## 2021-02-12 NOTE — Telephone Encounter (Signed)
Spoke with the patient on the phone today regarding her concerns and reinforcing the fact that she can ambulate in the postsurgical shoe.  The patient is very anxious and is having an anxiety episode over the course of her postoperative recovery.  She is approximately 3 weeks postop at the moment.  Reinforced that she can continue to massage her incision sites with antibiotic cream that was provided last visit.  Prescription for Lyrica 75 mg 2 times daily sent to her pharmacy for postoperative anxiety.  Patient states that she currently is not on any anxiety medication.  Currently all she is taking is Tylenol and ibuprofen.  All the patient questions were answered over the phone.  I reassured her that she is doing well 3 weeks postoperatively.  She states that she has been reading a lot of literature online and she has been reading successful and unsuccessful outcomes and different online opinions which are making her very concerned.  I reassured her that she is doing well 3 weeks postoperatively.  Continue weightbearing in the postoperative shoe.  Return to clinic on next scheduled appointment.  At that time we will initiate some physical therapy to increase her range of motion and break up scar tissue and transition her out of the postsurgical shoe into good supportive sneakers  Felecia Shelling, DPM [facility]  Dr. Felecia Shelling, DPM    [address] [place], [place] [address]                Office [phone]  Fax [phone]

## 2021-02-13 ENCOUNTER — Telehealth: Payer: Self-pay | Admitting: Podiatry

## 2021-02-13 NOTE — Telephone Encounter (Signed)
Sent a message to Dr. Logan Bores. Pt called needing to speak with him in regards to her surgery. He has since spoken with her.

## 2021-02-16 ENCOUNTER — Encounter: Payer: Self-pay | Admitting: Podiatry

## 2021-02-18 ENCOUNTER — Telehealth: Payer: Self-pay

## 2021-02-18 NOTE — Progress Notes (Signed)
   Subjective:  Patient presents today status post excision of Morton's neuroma second and third interspace right foot. DOS: 01/24/2021.  Patient states that the pain is approximately 5/10.  She is completed all of her pain medications.  She denies fever chills nausea vomiting shortness of breath or chest pains.  She is wearing the cam boot as instructed.  No new complaints at this time  Past Medical History:  Diagnosis Date  . Allergy       Objective/Physical Exam Neurovascular status intact.  Skin incisions appear to be well coapted with sutures intact. No sign of infectious process noted. No dehiscence. No active bleeding noted. Moderate edema noted to the surgical extremity.  Assessment: 1. s/p excision of Morton's neuroma second and third interspace right foot. DOS: 01/24/2021   Plan of Care:  1. Patient was evaluated.  2.  Today dressings were changed.  Keep clean dry and intact x1 week 3.  Continue minimal weightbearing in the cam boot 4.  Return to clinic in 1 week for suture removal  *Husband's name is Thayer Ohm.  Snowshoeing next week.  *Information systems manager.  Loves to snowboard.  [place]   Felecia Shelling, DPM [facility]  Dr. Felecia Shelling, DPM    [address] [place], [place] [address]                Office [phone]  Fax [phone]

## 2021-02-18 NOTE — Telephone Encounter (Signed)
Pt has called multiple times today, she is 3.5 weeks post op and is having tons of pain. Pt states that her uneven gait has caused her to start having back pain. Pt is experiencing increasingly worse pins and needles feeling in my whole foot. Please advise.

## 2021-02-20 ENCOUNTER — Telehealth: Payer: Self-pay | Admitting: Podiatry

## 2021-02-20 ENCOUNTER — Other Ambulatory Visit: Payer: Self-pay

## 2021-02-20 ENCOUNTER — Encounter: Payer: Self-pay | Admitting: Podiatry

## 2021-02-20 ENCOUNTER — Ambulatory Visit (INDEPENDENT_AMBULATORY_CARE_PROVIDER_SITE_OTHER): Payer: 59 | Admitting: Podiatry

## 2021-02-20 DIAGNOSIS — G5761 Lesion of plantar nerve, right lower limb: Secondary | ICD-10-CM

## 2021-02-20 DIAGNOSIS — Z9889 Other specified postprocedural states: Secondary | ICD-10-CM

## 2021-02-20 NOTE — Telephone Encounter (Signed)
Sent message to provider

## 2021-02-20 NOTE — Progress Notes (Signed)
   Subjective:  Patient presents today status post excision of Morton's neuroma second and third interspace right foot. DOS: 01/24/2021.  Patient is doing well today.  She is taking the Lyrica as prescribed.  She presents today with her friend, Ashley Brandt, in the room with her today.  Today she presents wearing her postsurgical shoe and explaining that wearing the shoe in the boot has caused a flareup of her sciatica to the right lower extremity.  She presents for further treatment evaluation  Past Medical History:  Diagnosis Date  . Allergy       Objective/Physical Exam Neurovascular status intact.  Skin incisions appear to be well coapted and healed. No sign of infectious process noted. No dehiscence. No active bleeding noted.  Minimal edema noted to the surgical extremity.  Patient does have swelling sensation to the plantar aspect of the ball of the foot.  She also has some paresthesia associated to the surgical area.  The foot overall is very hypersensitive.  When palpated she states that it is not painful, however it is "gross".   Assessment: 1. s/p excision of Morton's neuroma second and third interspace right foot. DOS: 01/24/2021   Plan of Care:  1. Patient was evaluated.  Today we had a very lengthy discussion discussing the patient's concerns and questions.  All of the patient's concerns and questions were answered.  Patient left feeling at ease and much more comfortable about her condition and postoperative recovery. 2.  Patient may now discontinue the postsurgical shoe in the cam boot.  Recommend good supportive sneakers that are wide in the toebox area and do not constrict the toes.  Hopefully with the patient discontinues the surgical shoes and cam boot her sciatica will calm down 3.  Recommend daily massage with anti-inflammatory topical.  The patient also uses CBD lotion which she states helped significantly.  Continue 3 times daily 4.  Continue daily range of motion exercises to  break up scar tissue adhesion and increase range of motion 5.  Order placed for physical therapy at [facility] 6.  Continue oral Lyrica 2 times daily as prescribed  7.  Return to clinic in 4 weeks  *Husband's name is Thayer Ohm.  Snowshoeing next week.  *Friend present with her today, 02/20/2021, Ashley Brandt *Information systems manager.  Loves to snowboard.  [place]   Felecia Shelling, DPM [facility]  Dr. Felecia Shelling, DPM    [address] [place], [place] [address]                Office [phone]  Fax [phone]

## 2021-02-20 NOTE — Telephone Encounter (Signed)
Patient called in stating she has found another PT facility and would rather go here instead   [facility] [address] [phone]

## 2021-02-20 NOTE — Telephone Encounter (Signed)
Patient stated she was referred to [facility] for PT but they don't accept [facility], patient has requested referral for the following facility, please advise   [facility]  [address] [phone]

## 2021-02-23 NOTE — Progress Notes (Signed)
   Subjective:  Patient presents today status post excision of Morton's neuroma second and third interspace right foot. DOS: 01/24/2021.  Patient states that she is having some postoperative anxiety associated to her surgery and perhaps she underestimated the postoperative recovery..  She also continues to have some pain and tenderness around the surgical area.  She has been minimally weightbearing in the cam boot as instructed.  No new complaints at this time  Past Medical History:  Diagnosis Date  . Allergy       Objective/Physical Exam Neurovascular status intact.  Skin incisions appear to be well coapted with sutures intact. No sign of infectious process noted. No dehiscence. No active bleeding noted.  Minimal edema noted to the surgical extremity.  Assessment: 1. s/p excision of Morton's neuroma second and third interspace right foot. DOS: 01/24/2021   Plan of Care:  1. Patient was evaluated.  All patient questions were answered 2.  Sutures removed today. 3.  Patient may discontinue the cam boot.  Postsurgical shoe dispensed today.  She may begin weightbearing in the postsurgical shoe as tolerated 4.  Patient may begin washing and showering and getting the foot wet 5.  Return to clinic in 2 weeks   *Husband's name is Thayer Ohm.  Snowshoeing next week.  *Information systems manager.  Loves to snowboard.  [place]   Felecia Shelling, DPM [facility]  Dr. Felecia Shelling, DPM    [address] [place], [place] [address]                Office [phone]  Fax [phone]

## 2021-02-25 ENCOUNTER — Other Ambulatory Visit: Payer: Self-pay

## 2021-02-25 DIAGNOSIS — Z9889 Other specified postprocedural states: Secondary | ICD-10-CM

## 2021-02-25 DIAGNOSIS — G5761 Lesion of plantar nerve, right lower limb: Secondary | ICD-10-CM

## 2021-02-27 ENCOUNTER — Telehealth: Payer: Self-pay | Admitting: Podiatry

## 2021-02-27 ENCOUNTER — Other Ambulatory Visit: Payer: Self-pay | Admitting: Podiatry

## 2021-02-27 MED ORDER — OXYCODONE-ACETAMINOPHEN 5-325 MG PO TABS: 1.0000 | ORAL_TABLET | ORAL | 0 refills | Status: DC | PRN

## 2021-02-27 NOTE — Telephone Encounter (Signed)
Patients husband calling to request a refill on behalf of patient for oxyCODONE-acetaminophen (PERCOCET) 5-325 MG tablet

## 2021-02-27 NOTE — Telephone Encounter (Signed)
Rx sent 

## 2021-02-27 NOTE — Progress Notes (Signed)
PRN postop 

## 2021-03-01 ENCOUNTER — Ambulatory Visit (INDEPENDENT_AMBULATORY_CARE_PROVIDER_SITE_OTHER): Payer: 59 | Admitting: Podiatry

## 2021-03-01 ENCOUNTER — Other Ambulatory Visit: Payer: Self-pay

## 2021-03-01 ENCOUNTER — Encounter: Payer: Self-pay | Admitting: Podiatry

## 2021-03-01 DIAGNOSIS — G5761 Lesion of plantar nerve, right lower limb: Secondary | ICD-10-CM

## 2021-03-01 DIAGNOSIS — Z9889 Other specified postprocedural states: Secondary | ICD-10-CM

## 2021-03-01 MED ORDER — ONDANSETRON HCL 4 MG PO TABS: 4.0000 mg | ORAL_TABLET | Freq: Three times a day (TID) | ORAL | 0 refills | Status: DC | PRN

## 2021-03-01 MED ORDER — GABAPENTIN 100 MG PO CAPS: 100.0000 mg | ORAL_CAPSULE | Freq: Three times a day (TID) | ORAL | 1 refills | Status: DC

## 2021-03-04 ENCOUNTER — Telehealth: Payer: Self-pay | Admitting: *Deleted

## 2021-03-04 NOTE — Telephone Encounter (Signed)
Patient is requesting a letter stating that she is unable to travel for an upcoming planned trip dated from April 17th to 29th. May send to [email or web address].

## 2021-03-05 ENCOUNTER — Encounter: Payer: Self-pay | Admitting: Podiatry

## 2021-03-05 NOTE — Progress Notes (Signed)
   Subjective:  Patient presents today status post excision of Morton's neuroma second and third interspace right foot. DOS: 01/24/2021.  Patient states that she is having a lot of pain to the surgical site.  Patient feels like is constantly pricking sensation in her foot.  She states it feels like she is stepping on a nail.  She denies any other acute complaints.  Past Medical History:  Diagnosis Date  . Allergy       Objective/Physical Exam Neurovascular status intact.  Skin incisions appear to be well coapted and healed. No sign of infectious process noted. No dehiscence. No active bleeding noted.  Minimal edema noted to the surgical extremity.  Patient does have swelling sensation to the plantar aspect of the ball of the foot.  She also has some paresthesia associated to the surgical area.  The foot overall is very hypersensitive.  When palpated she states that it is not painful, however it is "gross".   Assessment: 1. s/p excision of Morton's neuroma second and third interspace right foot. DOS: 01/24/2021   Plan of Care:  1. Patient was evaluated.  Today we had a very lengthy discussion discussing the patient's concerns and questions.  All of the patient's concerns and questions were answered.  Patient left feeling at ease and much more comfortable about her condition and postoperative recovery. 2.  Continue utilizing regular shoes. 3.  Recommend daily massage with anti-inflammatory topical.  The patient also uses CBD lotion which she states helped significantly.  Continue 3 times daily 4.  Continue daily range of motion exercises to break up scar tissue adhesion and increase range of motion 5.  Continue physical therapy 6.  I prescribed gabapentin to see if that can help with the pain. 7.  Return to clinic in 4 weeks  *Husband's name is Thayer Ohm.  Snowshoeing next week.  *Friend present with her today, 02/20/2021, Trula Ore *Information systems manager.  Loves to snowboard.  Recommended  [employer].

## 2021-03-11 ENCOUNTER — Encounter: Payer: 59 | Admitting: Podiatry

## 2021-03-14 ENCOUNTER — Encounter: Payer: Self-pay | Admitting: Podiatry

## 2021-03-18 ENCOUNTER — Ambulatory Visit (INDEPENDENT_AMBULATORY_CARE_PROVIDER_SITE_OTHER): Payer: 59 | Admitting: Podiatry

## 2021-03-18 ENCOUNTER — Encounter: Payer: Self-pay | Admitting: Podiatry

## 2021-03-18 ENCOUNTER — Other Ambulatory Visit: Payer: Self-pay

## 2021-03-18 DIAGNOSIS — G5761 Lesion of plantar nerve, right lower limb: Secondary | ICD-10-CM

## 2021-03-18 DIAGNOSIS — Z9889 Other specified postprocedural states: Secondary | ICD-10-CM

## 2021-03-18 MED ORDER — ZOLPIDEM TARTRATE 5 MG PO TABS: 5.0000 mg | ORAL_TABLET | Freq: Every evening | ORAL | 1 refills | Status: DC | PRN

## 2021-03-18 MED ORDER — GABAPENTIN 100 MG PO CAPS: 100.0000 mg | ORAL_CAPSULE | Freq: Three times a day (TID) | ORAL | 1 refills | Status: DC

## 2021-03-20 ENCOUNTER — Telehealth: Payer: Self-pay | Admitting: Podiatry

## 2021-03-20 ENCOUNTER — Encounter: Payer: 59 | Admitting: Podiatry

## 2021-03-20 NOTE — Telephone Encounter (Signed)
Patient called inquiring about order for MRI, stated she was told the order would be put in right away, Please Advise

## 2021-03-21 NOTE — Progress Notes (Signed)
   Subjective:  Patient presents today status post excision of Morton's neuroma second and third interspace right foot. DOS: 01/24/2021.  Patient is currently not taking any of the medications that were prescribed including gabapentin or Lyrica.  She did not like the way that it made her feel.  She continues to have pain and tenderness with the foot.  She has recently started on physical therapy which does help.  She has been doing Astym to help break up any scar tissue adhesions within the area.  She presents today with her husband for further treatment and evaluation.  They are both very concerned because it is now approximately 8 weeks postoperative and she continues to have pain and tenderness to the foot.  The foot is very guarded today.  Past Medical History:  Diagnosis Date  . Allergy       Objective/Physical Exam Neurovascular status intact.  Skin incisions appear to be well coapted and healed. No sign of infectious process noted. No dehiscence. No active bleeding noted.  Minimal edema noted to the surgical extremity.  Patient continues to have swelling sensation to the plantar aspect of the ball of the foot.  Patient is concerned for a skin dimple within the plantar ball of the foot at the fourth intermetatarsal space.  She states that this area is very painful and sensitive.  It does appear to be slightly swollen and that area.  Overall the foot is in a very guarded position and very hypersensitive and pain out of proportion to the actual surgery that was performed  Assessment: 1. s/p excision of Morton's neuroma second and third interspace right foot. DOS: 01/24/2021 2.  Postsurgical inflammation right foot 3.  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 Patient was evaluated.  Today we had another very lengthy discussion discussing the patient's concerns and questions.  All of the patient's concerns and questions were answered as well as the husband's.    2.  Discontinue gabapentin and Lyrica 3.  The patient is very concerned with scar tissue within the incision sites.  To help alleviate some of the patient's pain and break up any scar tissue adhesions decision was made today to provide steroid injections consisting of 0.5 cc Celestone Soluspan to the intermetatarsal spaces.  The patient tolerated this well 4.  Also for patient's concern of scar tissue and possibility of stump neuroma and the amount of inflammation and pain she is having, we are going to order an MRI of the surgical foot.  MRI order placed today 5.  Continue physical therapy, @ [facility], Ashley Brandt 6.  Return to clinic after MRI to review results and discuss further treatment options  *Requested that I contact her chiropractor, Ashley Brandt, [facility], and discuss possible treatment options with him  *Husband's name is Ashley Brandt.  *Friend present with her today, 02/20/2021, Ashley Brandt *Information systems manager.  Loves to snowboard.  Recommended [employer].   Ashley Brandt, DPM [facility]  Dr. Felecia Brandt, DPM    [address] [place], [place] [address]                Office [phone]  Fax [phone]

## 2021-03-21 NOTE — Telephone Encounter (Signed)
Patient calling to request an update on the order of her MRI. Patient states that it is really important she get the Mri done soon. Please advise.

## 2021-03-29 ENCOUNTER — Telehealth: Payer: Self-pay | Admitting: Podiatry

## 2021-03-29 NOTE — Telephone Encounter (Signed)
Sorry, the order for the MRI is placed @ [facility]. Thx

## 2021-03-29 NOTE — Telephone Encounter (Signed)
Patient called and said she was supposed to have an MRI this week but she hasnt heard anything. I do not see anything her in chart of this being ordered.

## 2021-03-29 NOTE — Addendum Note (Signed)
Addended by: Felecia Shelling on: 03/29/2021 01:22 PM   Modules accepted: Orders

## 2021-04-02 ENCOUNTER — Other Ambulatory Visit: Payer: Self-pay

## 2021-04-02 ENCOUNTER — Ambulatory Visit (HOSPITAL_COMMUNITY)
Admission: EM | Admit: 2021-04-02 | Discharge: 2021-04-02 | Disposition: A | Discharge status: Home / Self Care | Payer: 59 | Attending: Psychiatry | Admitting: Psychiatry

## 2021-04-02 DIAGNOSIS — G4700 Insomnia, unspecified: Secondary | ICD-10-CM | POA: Diagnosis not present

## 2021-04-02 DIAGNOSIS — F4323 Adjustment disorder with mixed anxiety and depressed mood: Secondary | ICD-10-CM | POA: Diagnosis not present

## 2021-04-02 MED ORDER — DULOXETINE HCL 30 MG PO CPEP: 30.0000 mg | ORAL_CAPSULE | Freq: Every day | ORAL | 0 refills | Status: DC

## 2021-04-02 MED ORDER — GABAPENTIN 300 MG PO CAPS: 300.0000 mg | ORAL_CAPSULE | Freq: Three times a day (TID) | ORAL | 0 refills | Status: DC

## 2021-04-02 MED ORDER — LORAZEPAM 1 MG PO TABS
2.0000 mg | ORAL_TABLET | Freq: Once | ORAL | Status: AC
  Administered 2021-04-02: 2 mg via ORAL
  Filled 2021-04-02: qty 2

## 2021-04-02 NOTE — ED Provider Notes (Signed)
[facility] Medical Screening Exam  Patient Name: Ashley Brandt MRN: [id number] Date of Evaluation: 04/02/21 Chief Complaint:   Diagnosis:  Final diagnoses:  Insomnia, unspecified type  Adjustment disorder with mixed anxiety and depressed mood    History of Present illness: Ashley Brandt is a 50 y.o. female.  Patient presents voluntarily to the [facility] accompanied by her husband voluntarily.  Patient reports that approximately 5 weeks ago she had surgery on her foot and since then she has been having difficulty with sleep.  Patient reports that she has little to no sleep and may have naps on occasion.  She reports that the pain in her foot is caused by a "raw nerve" and that she was started on Neurontin and she has slowly tapered up.  She reports that she was taking 100 mg p.o. 3 times daily and now she is taking a total of 500 mg during the day.  She reports that she has been feeling depressed and like she is almost at her again because she has not been able to work, sleep, do her normal daily routine and feels like she desperately needs some help.  She also reports having severe anxiety and is concerned about any medications that she starts.  She reports that she has had 51 years of life with out any medication needs until she had to have foot surgery.  Patient reports that she is spoken to her doctor multiple times about her sleep and she is tried trazodone, Ambien, melatonin, meditation, CBD oil, and tapping to relieve her anxiety.  Patient does appear to be distressed about her pain and her anxiety and her husband is supportive but is also distressed about the situation.  After discussing multiple options and with patient's presentation we come to an agreement.  Will prescribe patient Cymbalta 30 mg p.o. daily with plan to titrate to 2 tabs daily total of 60 mg in 1 week.  Also discussed with patient about increasing her gabapentin to 300 mg p.o. 3 times daily.  She was  instructed to discontinue her 100 mg capsules and she is provided with a prescription for gabapentin 300 mg p.o. 3 times daily.  These medications were E prescribed to pharmacy of choice.  Due to patient's distress demeanor agreed to provide patient with a one-time dose of Ativan 2 mg while she was at the clinic.  Patient's husband stated he would be driving her home and felt safe with discharging her with this medication.  Patient was also instructed for her to follow-up with her PCP and possibly request a sleep study.  Patient was asked about her thyroid levels and she stated that it was tested last week and everything came back normal.  Patient denied any suicidal or homicidal ideations and denied any hallucinations.  Psychiatric Specialty Exam  Presentation  General Appearance:Appropriate for Environment; Casual  Eye Contact:Good  Speech:Clear and Coherent; Normal Rate  Speech Volume:Normal  Handedness:Right   Mood and Affect  Mood:Anxious  Affect:Appropriate; Congruent   Thought Process  Thought Processes:Coherent  Descriptions of Associations:Intact  Orientation:Full (Time, Place and Person)  Thought Content:WDL    Hallucinations:None  Ideas of Reference:None  Suicidal Thoughts:No  Homicidal Thoughts:No   Sensorium  Memory:Immediate Good; Recent Good; Remote Good  Judgment:Good  Insight:Good   Executive Functions  Concentration:Good  Attention Span:Good  Recall:Good  Fund of Knowledge:Good  Language:Good   Psychomotor Activity  Psychomotor Activity:Normal   Assets  Assets:Communication Skills; Desire for Improvement; Financial Resources/Insurance;  Housing; Social Support; Transportation   Sleep  Sleep:Poor  Number of hours: No data recorded  No data recorded  Physical Exam: Physical Exam Vitals and nursing note reviewed.  Constitutional:      Appearance: She is well-developed.  HENT:     Head: Normocephalic.  Eyes:     Pupils:  Pupils are equal, round, and reactive to light.  Cardiovascular:     Rate and Rhythm: Normal rate.  Pulmonary:     Effort: Pulmonary effort is normal.  Musculoskeletal:        General: Normal range of motion.  Neurological:     Mental Status: She is alert and oriented to person, place, and time.  Psychiatric:        Mood and Affect: Mood is anxious.    Review of Systems  Constitutional: Negative.   HENT: Negative.   Eyes: Negative.   Respiratory: Negative.   Cardiovascular: Negative.   Gastrointestinal: Negative.   Genitourinary: Negative.   Musculoskeletal:       Pain in foot  Skin: Negative.   Neurological: Negative.   Endo/Heme/Allergies: Negative.   Psychiatric/Behavioral: Positive for depression. The patient is nervous/anxious and has insomnia.    Blood pressure 110/79, pulse 88, temperature 97.7 F (36.5 C), temperature source Temporal, resp. rate 18, SpO2 98 %. There is no height or weight on file to calculate BMI.  Musculoskeletal: Strength & Muscle Tone: within normal limits Gait & Station: normal Patient leans: N/A   [facility] MSE Discharge Disposition for Follow up and Recommendations: Based on my evaluation the patient does not appear to have an emergency medical condition and can be discharged with resources and follow up care in outpatient services for Individual Therapy   Maryfrances Bunnell, FNP 04/02/2021, 5:18 PM

## 2021-04-02 NOTE — Progress Notes (Signed)
TTS Triage: Pt to [facility] voluntarily with her husband due to difficulty sleeping for the last 5 weeks. Pt reports having surgery on her right foot on 01/24/2021 and continues to have pain related to surgery. Pt reports that she could deal with the pain if she was able to sleep. Pt reports taking Ambien, melatonin, tapping for anxiety, CBD and she tried an edible last week but nothing seems to help. Pt husband reports that patient is having increased anxiety and was laying on the floor crying due to lack of sleep. Pt also reports that her heart is racing. Pt and husband are wanting medications like xanax to help pt sleep. Clinician informed pt that NP or MD probably would not prescribe xanax and pt becomes tearful. Pt denies SI, HI, AVH    Pt is routine.

## 2021-04-02 NOTE — ED Notes (Signed)
Pt received Ativan 2mg  po prior to discharging to home accompanied by spouse. RN provided education on  Ativan and Gabapentin. Pt/spouse verbalized understanding. Safety maintained.

## 2021-04-02 NOTE — Discharge Instructions (Signed)

## 2021-04-07 ENCOUNTER — Other Ambulatory Visit: Payer: Self-pay

## 2021-04-07 ENCOUNTER — Ambulatory Visit
Admission: RE | Admit: 2021-04-07 | Discharge: 2021-04-07 | Disposition: A | Discharge status: Home / Self Care | Payer: 59 | Source: Ambulatory Visit | Attending: Podiatry | Admitting: Podiatry

## 2021-04-07 DIAGNOSIS — Z9889 Other specified postprocedural states: Secondary | ICD-10-CM

## 2021-04-07 DIAGNOSIS — G5761 Lesion of plantar nerve, right lower limb: Secondary | ICD-10-CM

## 2021-04-22 ENCOUNTER — Emergency Department (HOSPITAL_COMMUNITY): Payer: 59

## 2021-04-22 ENCOUNTER — Other Ambulatory Visit: Payer: Self-pay

## 2021-04-22 ENCOUNTER — Emergency Department (HOSPITAL_COMMUNITY)
Admission: EM | Admit: 2021-04-22 | Discharge: 2021-04-22 | Disposition: A | Discharge status: Home / Self Care | Payer: 59 | Attending: Emergency Medicine | Admitting: Emergency Medicine

## 2021-04-22 DIAGNOSIS — R609 Edema, unspecified: Secondary | ICD-10-CM

## 2021-04-22 DIAGNOSIS — M79671 Pain in right foot: Secondary | ICD-10-CM | POA: Diagnosis present

## 2021-04-22 MED ORDER — ALPRAZOLAM 0.25 MG PO TABS
0.5000 mg | ORAL_TABLET | Freq: Once | ORAL | Status: AC
  Administered 2021-04-22: 0.5 mg via ORAL
  Filled 2021-04-22: qty 2

## 2021-04-22 NOTE — Discharge Instructions (Addendum)
You have been seen and discharged from the emergency department.  You are to return here tomorrow for arterial ultrasound of the right lower extremity.  Follow-up with podiatry and or orthopedics for further evaluation and care.  Continue to take your prescribed medications as directed.  If you have any worsening symptoms or further concerns for your health please return to an emergency department for further evaluation.

## 2021-04-22 NOTE — ED Provider Notes (Signed)
[facility] EMERGENCY DEPARTMENT Provider Note   CSN: [id number] Arrival date & time: 04/22/21  1555     History Chief Complaint  Patient presents with  . Foot Pain    Ashley Brandt is a 50 y.o. female.  HPI   50 year old female presents the emergency department right foot pain.  Patient had a Morton's neuroma removed in February/2022 by podiatry.  Since then she has had numbness and pain along the right toes and sole of the foot.  Patient has been following up with her surgeon, they have been putting her on Lyrica and gabapentin with only mild relief.  Patient states the pain is becoming so severe that they did an outpatient MRI, there is noted to be a lot of scar tissue.  She got a second opinion with orthopedics who believes that the swelling will go down over the next couple weeks and improve the nerve pain.  However patient is very concerned that there is something else going on.  She states that when she sits for prolonged periods of time there is a small area on the plantar aspect of the foot that becomes discolored/purple.  No other foot discoloration.  No recent fever or illness.  Past Medical History:  Diagnosis Date  . Allergy     Patient Active Problem List   Diagnosis Date Noted  . Seasonal and perennial allergic rhinitis 02/14/2020  . Food intolerance 02/14/2020    Past Surgical History:  Procedure Laterality Date  . BREAST REDUCTION SURGERY  2006  . CESAREAN SECTION    . CESAREAN SECTION  2000     OB History   No obstetric history on file.     Family History  Problem Relation Age of Onset  . Diabetes Mellitus II Mother   . Heart Problems Father     Social History   Tobacco Use  . Smoking status: Never Smoker  . Smokeless tobacco: Never Used  Vaping Use  . Vaping Use: Never used  Substance Use Topics  . Alcohol use: Yes    Comment: social   . Drug use: No    Home Medications Prior to Admission medications   Medication  Sig Start Date End Date Taking? Authorizing Provider  albuterol (VENTOLIN HFA) 108 (90 Base) MCG/ACT inhaler Inhale 2 puffs into the lungs every 6 (six) hours as needed for wheezing or shortness of breath. 02/14/20   Alfonse Spruce, MD  azelastine (ASTELIN) 0.1 % nasal spray Place 2 sprays into both nostrils 2 (two) times daily as needed for rhinitis. 05/15/20   Alfonse Spruce, MD  cholecalciferol (VITAMIN D3) 25 MCG (1000 UNIT) tablet Take 5,000 Units by mouth daily.    [provider]  DULoxetine (CYMBALTA) 30 MG capsule Take 1 capsule (30 mg total) by mouth daily. Increase to 2 tablets (60 mg) daily in one week 04/02/21 04/02/22  Money, Gerlene Burdock, FNP  fluticasone (FLONASE) 50 MCG/ACT nasal spray Place 2 sprays into both nostrils daily as needed for allergies or rhinitis. 05/15/20   Alfonse Spruce, MD  gabapentin (NEURONTIN) 300 MG capsule Take 1 capsule (300 mg total) by mouth 3 (three) times daily. 04/02/21 05/02/21  Money, Gerlene Burdock, FNP  ibuprofen (ADVIL) 800 MG tablet Take 1 tablet (800 mg total) by mouth 3 (three) times daily. 01/24/21   Felecia Shelling, DPM  levocetirizine (XYZAL) 5 MG tablet Take 1 tablet (5 mg total) by mouth every evening. 05/15/20   Alfonse Spruce, MD  loratadine (CLARITIN) 10 MG tablet Take 10 mg by mouth daily.    [provider]  methylPREDNISolone (MEDROL DOSEPAK) 4 MG TBPK tablet 6 day dose pack - take as directed 11/20/20   Felecia Shelling, DPM  Omega-3 Fatty Acids (FISH OIL) 1000 MG CAPS Take 3,600 mg by mouth daily.    [provider]  ondansetron (ZOFRAN) 4 MG tablet Take 1 tablet (4 mg total) by mouth every 8 (eight) hours as needed for nausea or vomiting. 03/01/21   Candelaria Stagers, DPM  oxyCODONE-acetaminophen (PERCOCET) 5-325 MG tablet Take 1 tablet by mouth every 4 (four) hours as needed for severe pain. 02/27/21   Felecia Shelling, DPM  Probiotic Product (PROBIOTIC-10 PO) Take 1 tablet by mouth daily.    [provider]  zolpidem (AMBIEN) 5 MG tablet Take 1 tablet (5 mg total) by mouth at bedtime as needed for sleep. 03/18/21   Felecia Shelling, DPM    Allergies    Dust mite mixed allergen ext [mite (d. farinae)]  Review of Systems   Review of Systems  Constitutional: Negative for chills and fever.  Respiratory: Negative for shortness of breath.   Cardiovascular: Negative for chest pain.  Gastrointestinal: Negative for abdominal pain.  Genitourinary: Negative for dysuria.  Musculoskeletal:       + Right foot pain  Skin: Positive for color change.  Neurological: Negative for weakness, numbness and headaches.    Physical Exam Updated Vital Signs BP 106/65 (BP Location: Right Arm)   Pulse (!) 54   Temp 98.9 F (37.2 C) (Oral)   Resp 16   SpO2 100%   Physical Exam Vitals and nursing note reviewed.  Constitutional:      Appearance: Normal appearance.  HENT:     Head: Normocephalic.     Mouth/Throat:     Mouth: Mucous membranes are moist.  Cardiovascular:     Rate and Rhythm: Normal rate.  Pulmonary:     Effort: Pulmonary effort is normal. No respiratory distress.  Musculoskeletal:     Comments: Linear scars secondary to surgery, foot otherwise appears rather unremarkable when compared to the left, equal palpable DP pulses, normal capillary refill of all 5 toes, no foot/ankle/calf swelling, neuro intact  Skin:    General: Skin is warm.  Neurological:     Mental Status: She is alert and oriented to person, place, and time. Mental status is at baseline.  Psychiatric:        Mood and Affect: Mood normal.     ED Results / Procedures / Treatments   Labs (all labs ordered are listed, but only abnormal results are displayed) Labs Reviewed - No data to display  EKG None  Radiology DG Foot Complete Right  Result Date: 04/22/2021 CLINICAL DATA:  Right-sided foot pain EXAM: RIGHT FOOT COMPLETE - 3+ VIEW COMPARISON:  MRI 04/07/2021, 11/12/2020 FINDINGS: There is no evidence of  fracture or dislocation. There is no evidence of arthropathy or other focal bone abnormality. Soft tissues are unremarkable. IMPRESSION: Negative. Electronically Signed   By: Jasmine Pang M.D.   On: 04/22/2021 17:58    Procedures Procedures   Medications Ordered in ED Medications  ALPRAZolam (XANAX) tablet 0.5 mg (has no administration in time range)    ED Course  I have reviewed the triage vital signs and the nursing notes.  Pertinent labs & imaging results that were available during my care of the patient were reviewed by me and considered in my medical decision  making (see chart for details).    MDM Rules/Calculators/A&P                          50 year old female presents the emergency department with right foot pain stemming from surgery 3 months ago.  Patient has concern for an area of discoloration on the sole of the foot that develops after she is sitting for long periods of time.  During my exam I had the patient sit up.  She has equal palpable DP pulses in all positions, there was no significant foot discoloration that was reproduced during my exam.  She has no foot/ankle/calf swelling, low suspicion for DVT at this time.  Patient is very concerned that there is something further going on with the foot like a possible arterial blood flow problem.  We do not have vascular ultrasound here overnight.  I have a very low suspicion for a vascular arterial problem given her equal pulses and unremarkable foot exam however I have placed an order for her to return tomorrow morning for this arterial ultrasound.  With how low my suspicion is I do not feel any pretreatment with Lovenox is necessary.  She is been ambulatory, neuro intact.  Vitals are normal.  X-ray was negative.  I have instructed that she comes tomorrow for the vascular imaging and otherwise will follow-up as an outpatient with podiatry/orthopedics.  Advised her to be compliant with her home medications.  Patient will be  discharged and treated as an outpatient.  Discharge plan and strict return to ED precautions discussed, patient verbalizes understanding and agreement.  Final Clinical Impression(s) / ED Diagnoses Final diagnoses:  Foot pain, right    Rx / DC Orders ED Discharge Orders         Ordered    US ARTERIAL LOWER EXTREMITY DUPLEX RIGHT(NON-ABI)  Status:  Canceled        04/22/21 2207    VAS [place] LOWER EXTREMITY ARTERIAL DUPLEX        04/22/21 2220    VAS [place] LOWER EXTREMITY VENOUS (DVT)  Status:  Canceled        04/22/21 2221           Syndey Jaskolski, Clabe Seal, DO 04/22/21 2230

## 2021-04-22 NOTE — ED Notes (Signed)
Patient verbalizes understanding of discharge instructions. Pt  scheduled for an outpatient [place] tomorrow. Pt made aware of imaging. Opportunity for questioning and answers were provided. Armband removed by staff, pt discharged from ED ambulatory.

## 2021-04-22 NOTE — ED Triage Notes (Signed)
Pt here for increased pain to R foot. Pt states if she does not elevate her leg, her foot turns purple.

## 2021-04-22 NOTE — ED Provider Notes (Signed)
Emergency Medicine Provider Triage Evaluation Note  Ashley Brandt 50 y.o. female was evaluated in triage.  Pt complains of right foot pain that has been ongoing since she had surgery on her Morton's neuroma in February 2022.  Reports increased and continued pain.  She reports some color changes as well.  States that this intermittently occurs.  She states she has had some numbness since the surgery which she states she had a nerve cut out/C expected.  She reports she has been on Lyrica and gabapentin with no improvement.  She has followed up with her surgeon.  Reports that she has noted to her last couple days, her foot is down and not elevated, no change colors.  As soon as she elevates her foot, it is better.  No fevers.  No new trauma, injury. Review of Systems  Positive: Foot pain Negative: No new Numbness/weakness. Fevers   Physical Exam  BP 134/82   Pulse 70   Temp 98.2 F (36.8 C) (Oral)   Resp 18   Ht 5\' 4"  (1.626 m)   Wt 65.8 kg   SpO2 100%   BMI 24.89 kg/m  Gen:   Awake, no distress   Resp:  Normal effort  Cardiac:  Normal rate. 2+ DP pulse to RLE.  MSK:   Moves extremities without difficulty. SKIN:  Good distal cap refill. RLE is not dusky in appearance or cool to touch. Neuro:  Speech clear  Medical Decision Making  Medically screening exam initiated at 3:55 AM.  Appropriate orders placed.  Ashley Brandt was informed that the remainder of the evaluation will be completed by another provider, this initial triage assessment does not replace that evaluation, and the importance of remaining in the ED until their evaluation is complete.   Clinical Impression  Foot pain   Portions of this note were generated with Dragon dictation software. Dictation errors may occur despite best attempts at proofreading.     Gar Ponto, PA-C 04/22/21 1649    06/22/21, DO 04/23/21 0010

## 2021-04-23 ENCOUNTER — Ambulatory Visit (HOSPITAL_COMMUNITY)
Admission: RE | Admit: 2021-04-23 | Discharge: 2021-04-23 | Disposition: A | Discharge status: Home / Self Care | Payer: 59 | Source: Ambulatory Visit | Attending: Emergency Medicine | Admitting: Emergency Medicine

## 2021-04-23 DIAGNOSIS — R609 Edema, unspecified: Secondary | ICD-10-CM | POA: Insufficient documentation

## 2021-04-29 ENCOUNTER — Encounter: Payer: Self-pay | Admitting: Neurology

## 2021-06-11 ENCOUNTER — Encounter: Payer: 59 | Attending: Physical Medicine & Rehabilitation | Admitting: Physical Medicine & Rehabilitation

## 2021-06-11 ENCOUNTER — Encounter: Payer: Self-pay | Admitting: Physical Medicine & Rehabilitation

## 2021-06-11 ENCOUNTER — Other Ambulatory Visit: Payer: Self-pay

## 2021-06-11 VITALS — BP 99/78 | HR 81 | Temp 99.5°F | Ht 62.5 in | Wt 128.2 lb

## 2021-06-11 DIAGNOSIS — G5771 Causalgia of right lower limb: Secondary | ICD-10-CM | POA: Diagnosis not present

## 2021-06-11 NOTE — Patient Instructions (Addendum)
Please f/u post op if pain persists after your surgeon releases you Complex Regional Pain Syndrome Complex regional pain syndrome (CRPS) is a nerve disorder that causes long-term (chronic) pain. This is usually in a hand, arm, foot, or leg. CRPS usually occurs afteran injury or trauma, such as a fracture or sprain. There are two types of CRPS: Type 1. This type occurs after an injury with no known damage to a nerve. Type 2. This type occurs after an injury that damages a nerve. There are three stages of the condition: Stage 1. This stage, called the acute stage, may last for up to 3 months. Stage 2. This stage, called the dystrophic stage, may last for 3-12 months. Stage 3. This stage, called the atrophic stage, may start after one year. CRPS ranges from mild to severe. For most people, CRPS is mild and recovery happens over time. For others, CRPS lasts for a very long time and makeseveryday tasks hard to do. What are the causes? The exact cause of this condition is not known. It is usually triggered by aninjury. What increases the risk? You are more likely to develop this condition if: You are female. You have any of the following injuries: A wrist fracture that involves a lower arm bone (distal radius fracture). Ankle dislocation or fracture. A long surgery time. Possible nerve injury during surgery. What are the signs or symptoms? Signs and symptoms in the affected hand, arm, foot, or leg are different foreach stage. Signs and symptoms of stage 1 include: Burning pain. A prickling, tingling feeling (pins and needles sensation). Extremely sensitive skin. Swelling. Joint stiffness. Warmth and redness. Excessive sweating. Hair and nail growth that is faster than normal. Signs and symptoms of stage 2 include: Spreading of pain to the whole arm or leg. Increased skin sensitivity. Increased swelling and stiffness. Coolness of the skin. Blue discoloration of skin. Loss of skin  wrinkles. Brittle fingernails. Signs and symptoms of stage 3 include: Pain that spreads to other areas of the body but becomes less severe. More stiffness, leading to loss of motion. Skin that is pale, dry, shiny, or tightly stretched. How is this diagnosed? This condition may be diagnosed based on: Your signs and symptoms.  A physical exam. There is no test to diagnose CRPS, but you may have tests: To check for bone changes that might indicate CRPS. These tests may include an MRI or bone scan. To rule out other possible causes of your symptoms. How is this treated? Early treatment may prevent CRPS from advancing past stage 1. There is not one treatment that works for everyone. Treatment options may include: Medicines, which may include: NSAIDs, such as ibuprofen. Steroids. Blood pressure drugs. Antidepressants. Anti-seizure drugs. Pain relievers. Exercise. Occupational therapy (OT) and physical therapy (PT). Biofeedback. Mental health counseling. Numbing injections. Spinal surgery to implant a spinal cord stimulator or a pain pump. Follow these instructions at home: Medicines Take over-the-counter and prescription medicines only as told by your health care provider. Do not drive or use heavy machinery while taking prescription pain medicine. If you are taking prescription pain medicine, take actions to prevent or treat constipation. Your health care provider may recommend that you: Drink enough fluid to keep your urine pale yellow. Eat foods that are high in fiber, such as fresh fruits and vegetables, whole grains, and beans. Limit foods that are high in fat and processed sugars, such as fried or sweet foods. Take an over-the-counter or prescription medicine for constipation. General instructions Do not  use any products that contain nicotine or tobacco, such as cigarettes and e-cigarettes. If you need help quitting, ask your health care provider. Maintain a healthy  weight. Return to your normal activities as told by your health care provider. Ask your health care provider what activities are safe for you. Follow an exercise program as directed by your health care provider. Keep all follow-up visits as told by your health care provider. This is important. Contact a health care provider if: Your symptoms change. Your symptoms get worse. You develop anxiety or depression. Summary Complex regional pain syndrome (CRPS) is a nerve disorder that causes long-term (chronic) pain, usually in a hand, arm, leg, or foot. CRPS usually occurs after an injury or trauma, such as a fracture or sprain. CRPS ranges from mild to severe. Early treatment may prevent CRPS from advancing to more severe stages. This information is not intended to replace advice given to you by your health care provider. Make sure you discuss any questions you have with your healthcare provider. Document Revised: 09/20/2020 Document Reviewed: 09/20/2020 Elsevier Patient Education  2022 [employer].

## 2021-06-11 NOTE — Progress Notes (Signed)
Subjective:    Patient ID: Ashley Brandt, female    DOB: 1971-08-01, 50 y.o.   MRN: [id number]  HPI CC: RIght foot /toe pain 50 year old female referred by her primary care physician for the evaluation of right foot and toe pain.  The patient states that she has 2 types of pain 1 that is on the bottom of the foot right around the area of the surgery the other area is more in the middle of the foot.  She really has few symptoms at the ankle or above.  She notes mild swelling in the right foot sometimes discoloration as well.  It is very hypersensitive to touch particularly on the plantar surface. 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  She was started on Lyrica and had some side effects and did not wish to continue taking it.  The patient also had a behavioral health evaluation 04/02/2021 diagnosed with insomnia and adjustment disorder with mixed anxiety depressed mood.  She tried Ambien and melatonin.  She has been on Cymbalta 30 mg up to 60 mg a day.  She is now on gabapentin 600 mg 3 times per day.  The patient has had a second opinion with orthopedic surgeon.  The patient has had an arterial Doppler on 04/23/2021 which was normal.  She is scheduling surgery at [facility] with a peripheral neurosurgeon with plans for resection of neuroma as well as wrapping the end of the neuroma with abdominal epidermis to prevent reforming.     CLINICAL DATA:  Severe plantar foot pain since foot surgery 3-4 weeks ago for removal of Morton's neuroma (on 01/24/2021).   EXAM: MRI OF THE RIGHT FOREFOOT WITHOUT CONTRAST   TECHNIQUE: Multiplanar, multisequence MR imaging of the right forefoot was performed. No intravenous contrast was administered.   COMPARISON:  Office foot radiographs 11/12/2020. No previous MRI available.   FINDINGS: Bones/Joint/Cartilage   Mild degenerative changes of the 1st metatarsophalangeal  joint with a small subchondral cyst in the 1st metatarsal head and a small joint effusion. No other significant arthropathic changes. There is no evidence of acute fracture, dislocation or avascular necrosis.   Ligaments   The Lisfranc ligament is intact. The collateral ligaments of the metatarsophalangeal joints appear intact.   Muscles and Tendons   There is focal susceptibility artifact within the plantar soft tissues at the level of the 2nd and 3rd metatarsal necks, presumably postsurgical. There is mild surrounding soft tissue edema as well as trace fluid within the flexor tendon sheaths of the 2nd and 3rd digits. The interosseous muscles are mildly edematous. There is additional ill-defined low signal between the 2nd and 3rd metatarsal necks, also likely postsurgical. No drainable fluid collection.   Soft tissues   The plantar soft tissues are deformed by a capsule which was placed along the plantar aspect of the 3rd metatarsal head. As above, there is underlying soft tissue edema of and presumed postsurgical susceptibility artifact. No drainable fluid collection or soft tissue mass identified.   IMPRESSION: 1. Nonspecific soft tissue edema and susceptibility artifact in the plantar forefoot soft tissues, attributed to recent surgery. Possible mild flexor tenosynovitis of the 2nd and 3rd rays. 2. No drainable fluid collection. 3. First MTP joint degenerative changes and small effusion. No acute osseous findings.     Electronically Signed   By: Richardean Sale M.D.   On: 04/08/2021 09:41 Pain Inventory Average Pain 9 Pain Right Now  5 My pain is sharp, burning, and stabbing  In the last 24 hours, has pain interfered with the following? General activity 8 Relation with others 8 Enjoyment of life 8 What TIME of day is your pain at its worst? evening Sleep (in general) Fair  Pain is worse with: walking, sitting, and standing Pain improves with: rest, pacing  activities, and medication Relief from Meds: 6  walk without assistance how many minutes can you walk? 5-10 ability to climb steps?  yes do you drive?  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 Diabetes Mellitus II Mother   . Heart Problems Father    Social History   Socioeconomic History  . Marital status: Married    Spouse name: Not on file  . Number of children: Not on file  . Years of education: Not on file  . Highest education level: Not on file  Occupational History  . Not on file  Tobacco Use  . Smoking status: Never  . Smokeless tobacco: Never  Vaping Use  . Vaping Use: Never used  Substance and Sexual Activity  . Alcohol use: Yes    Comment: social   . Drug use: No  . Sexual activity: Yes    Birth control/protection: Abstinence, None  Other Topics Concern  . Not on file  Social History Narrative  . Not on file   Social Determinants of Health   Financial Resource Strain: Not on file  Food Insecurity: Not on file  Transportation Needs: Not on file  Physical Activity: Not on file  Stress: Not on file  Social Connections: Not on file   Past Surgical History:  Procedure Laterality Date  . BREAST REDUCTION SURGERY  2006  . CESAREAN SECTION    . CESAREAN SECTION  2000   Past Medical History:  Diagnosis Date  . Allergy    BP 99/78 (BP Location: Left Arm, Patient Position: Sitting, Cuff Size: Normal)   Pulse 81   Temp 99.5 F (37.5 C) (Oral)   Ht 5' 2.5" (1.588 m)   Wt 128 lb 3.2 oz (58.2 kg)   SpO2 98%   BMI 23.07 kg/m   Opioid Risk Score:   Fall Risk Score:  `1  Depression screen PHQ 2/9  Depression screen [facility] 2/9 06/11/2021 01/20/2016  Decreased Interest 1 0  Down, Depressed, Hopeless 1 0  PHQ - 2 Score 2 0   Altered sleeping 1 -  Tired, decreased energy 1 -  Change in appetite 0 -  Feeling bad or failure about yourself  1 -  Trouble concentrating 1 -  Moving slowly or fidgety/restless 1 -  Suicidal thoughts 0 -  PHQ-9 Score 7 -  Difficult doing work/chores Very difficult -     Review of Systems  Musculoskeletal:  Positive for gait problem.       Foot pain  Neurological:  Positive for dizziness.  All other systems reviewed and are negative.     Objective:   Physical Exam Vitals and nursing note reviewed.  Constitutional:      Appearance: She is normal weight.  HENT:     Head: Normocephalic and atraumatic.  Eyes:     Extraocular Movements: Extraocular movements intact.     Conjunctiva/sclera: Conjunctivae normal.  Pupils: Pupils are equal, round, and reactive to light.  Cardiovascular:     Rate and Rhythm: Normal rate and regular rhythm.     Heart sounds: Normal heart sounds.  Pulmonary:     Effort: Pulmonary effort is normal. No respiratory distress.     Breath sounds: Normal breath sounds.  Abdominal:     General: Abdomen is flat. Bowel sounds are normal. There is no distension.     Palpations: Abdomen is soft.  Musculoskeletal:     Cervical back: Normal range of motion.     Right foot: No deformity, bunion, Charcot foot or foot drop.     Left foot: No deformity, bunion, Charcot foot or foot drop.  Feet:     Right foot:     Skin integrity: Skin integrity normal.     Toenail Condition: Right toenails are normal.     Left foot:     Skin integrity: Skin integrity normal.     Toenail Condition: Left toenails are normal.     Comments: Mild edema dorsum right foot No temp difference R v L foot Hypersensitive from mid foot down No heel tenderness no mid foot deformity No hypersensitivity over surgical scars on ROght dorsum between met heads of digits 2-3-4 Skin:    General: Skin is warm and dry.  Neurological:     Mental Status: She is alert and oriented to person,  place, and time.     Cranial Nerves: No dysarthria.     Sensory: No sensory deficit.     Motor: No weakness, tremor, atrophy or abnormal muscle tone.     Coordination: Coordination is intact.     Comments: Antalgic gait with reduced weight bearing R forefoot  Psychiatric:        Mood and Affect: Mood normal.        Behavior: Behavior normal.          Assessment & Plan:   CRPS 2 using [place] criteria listed below still in inflammatory phase, vasomotor symptoms fairly mild As discussed with pt and husband, would not initiate oral steroids since surgery only 2 weeks away.  Pt will complete her post op followups in [place] and follow up in clinic for unresolved pain Pt specifically asked about Ketamine tx which was helpful in ED.  If needed can refer to centers which do this.  Sensory: Reports of hyperesthesia and/or allodynia YES  .Vasomotor: Reports of temperature asymmetry and/or skin color changes and/orskin color asymmetry  .Sudomotor/edema: Reports of edema and/or sweating changes and/or sweatingasymmetry  .Motor/trophic: Reports of decreased range of motion and/or motor dysfunction(weakness, tremor, dystonia) and/or trophic changes (hair, nail, skin) ankle contracture   ?For the clinical diagnosis of CRPS, the patient must display at least one sign at the time of evaluation in two of the four following categories (the more stringent research criteria require at least one sign in three of the fourcategories):  .Sensory: Evidence of hyperalgesia (to pinprick) and/or allodynia (to light touch and/or temperature sensation and/or deep somatic pressure and/or jointmovement)  .Vasomotor: Evidence of temperature asymmetry (>1C) and/or skin color changes and/or asymmetry  .Sudomotor/edema: Evidence of edema and/or sweating changes and/or sweatingasymmetry  .Motor/trophic: Evidence of decreased range of motion and/or motor dysfunction(weakness, tremor, dystonia) and/or trophic  changes (hair, nail, skin)  ?There is no other diagnosis that better explains the signs and symptoms

## 2021-07-05 ENCOUNTER — Ambulatory Visit: Payer: 59 | Admitting: Neurology

## 2021-12-30 IMAGING — DX DG FOOT COMPLETE 3+V*R*
3 series · 3 of 3 positions shown · non-contrast
Comparison: MRI 04/07/2021, 11/12/2020

CLINICAL DATA: Right-sided foot pain

EXAM:
RIGHT FOOT COMPLETE - 3+ VIEW

[x foot ap right]
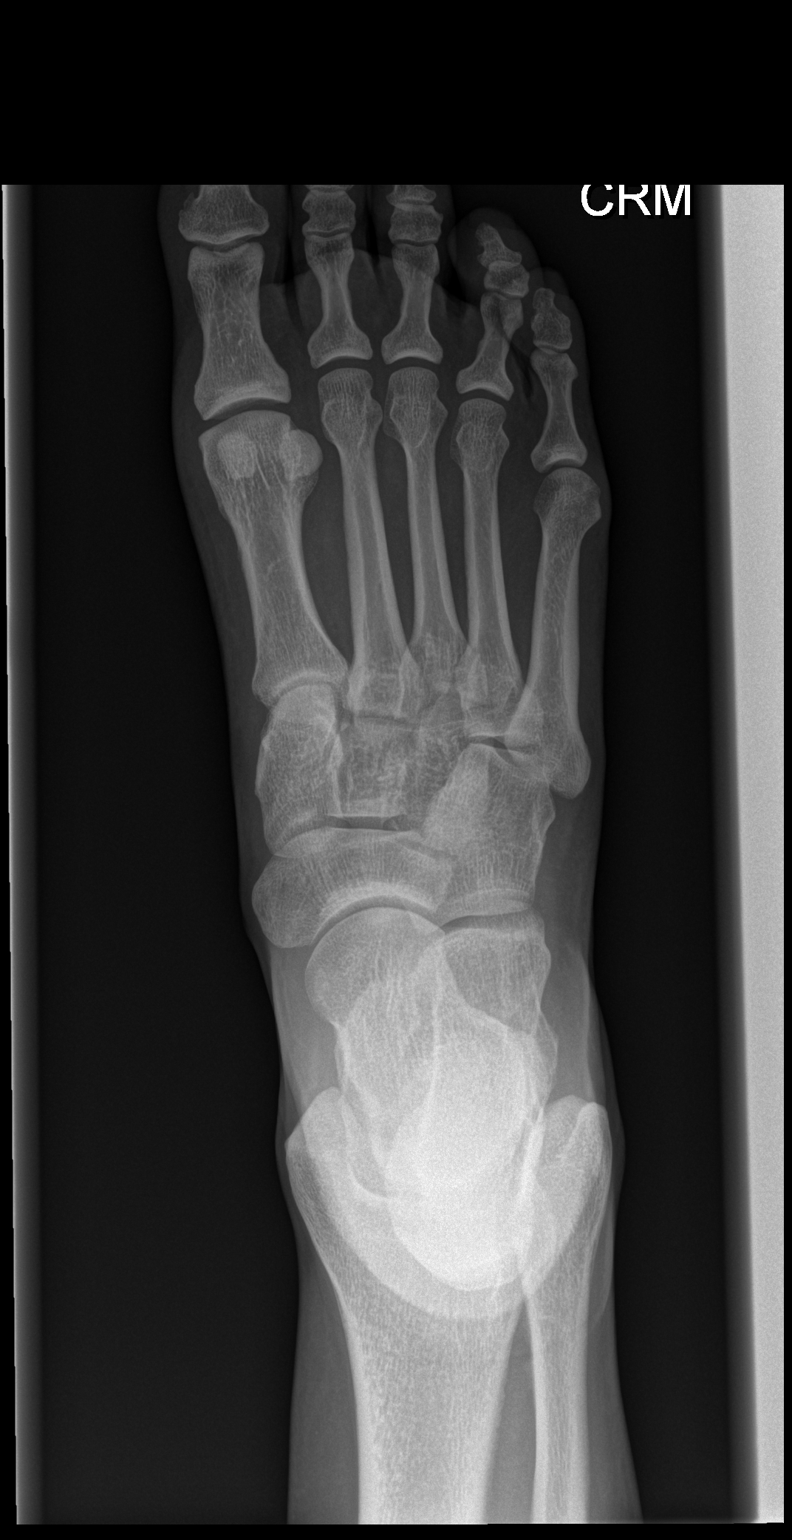

[x foot obl right]
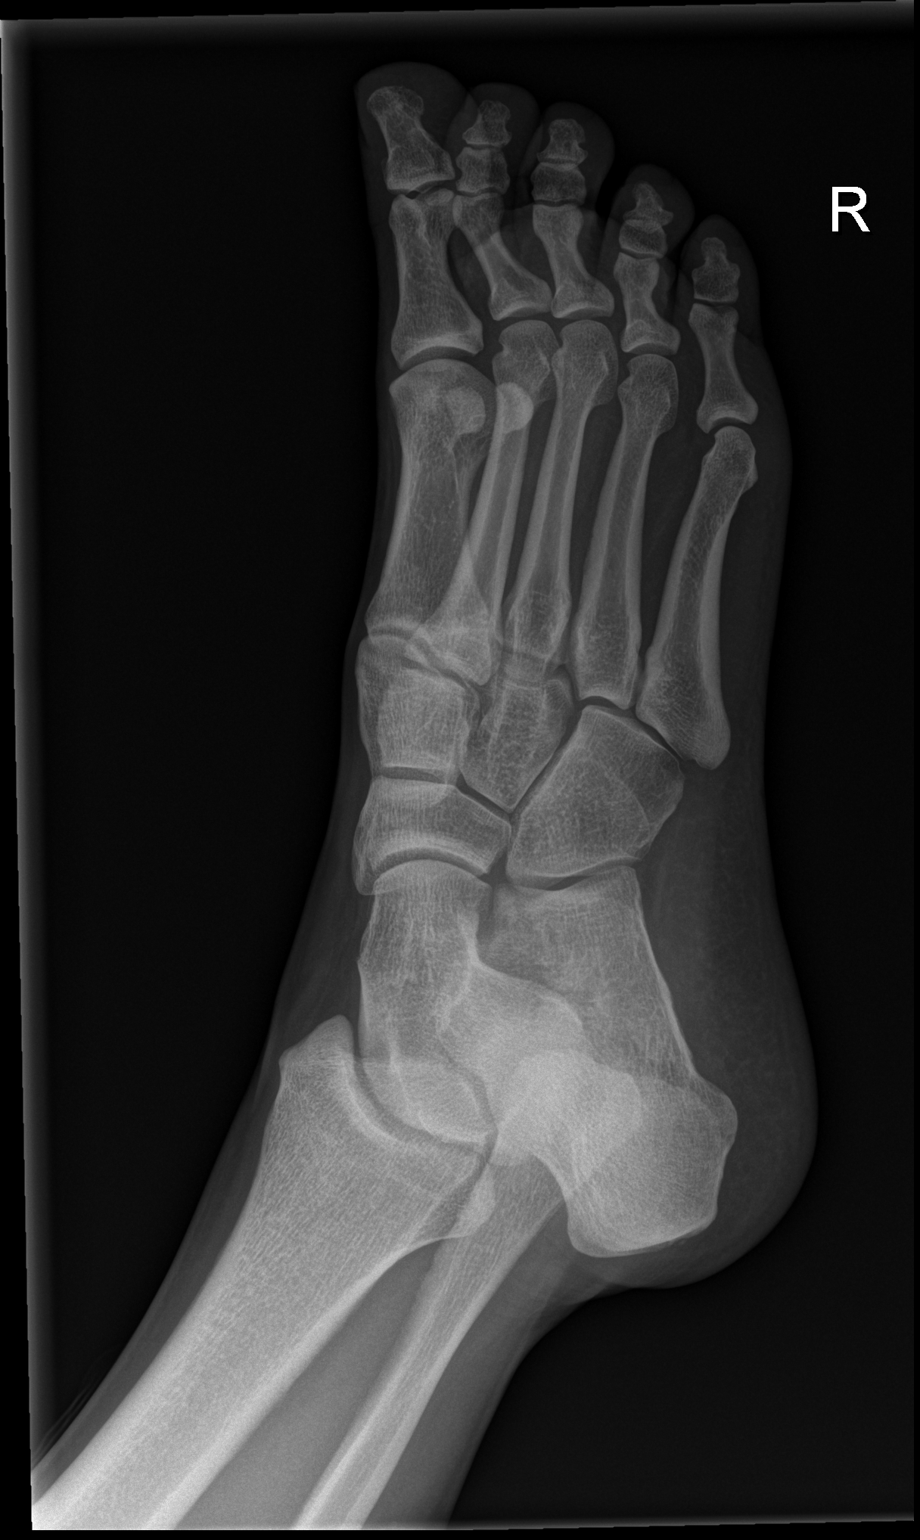

[x foot lat right]
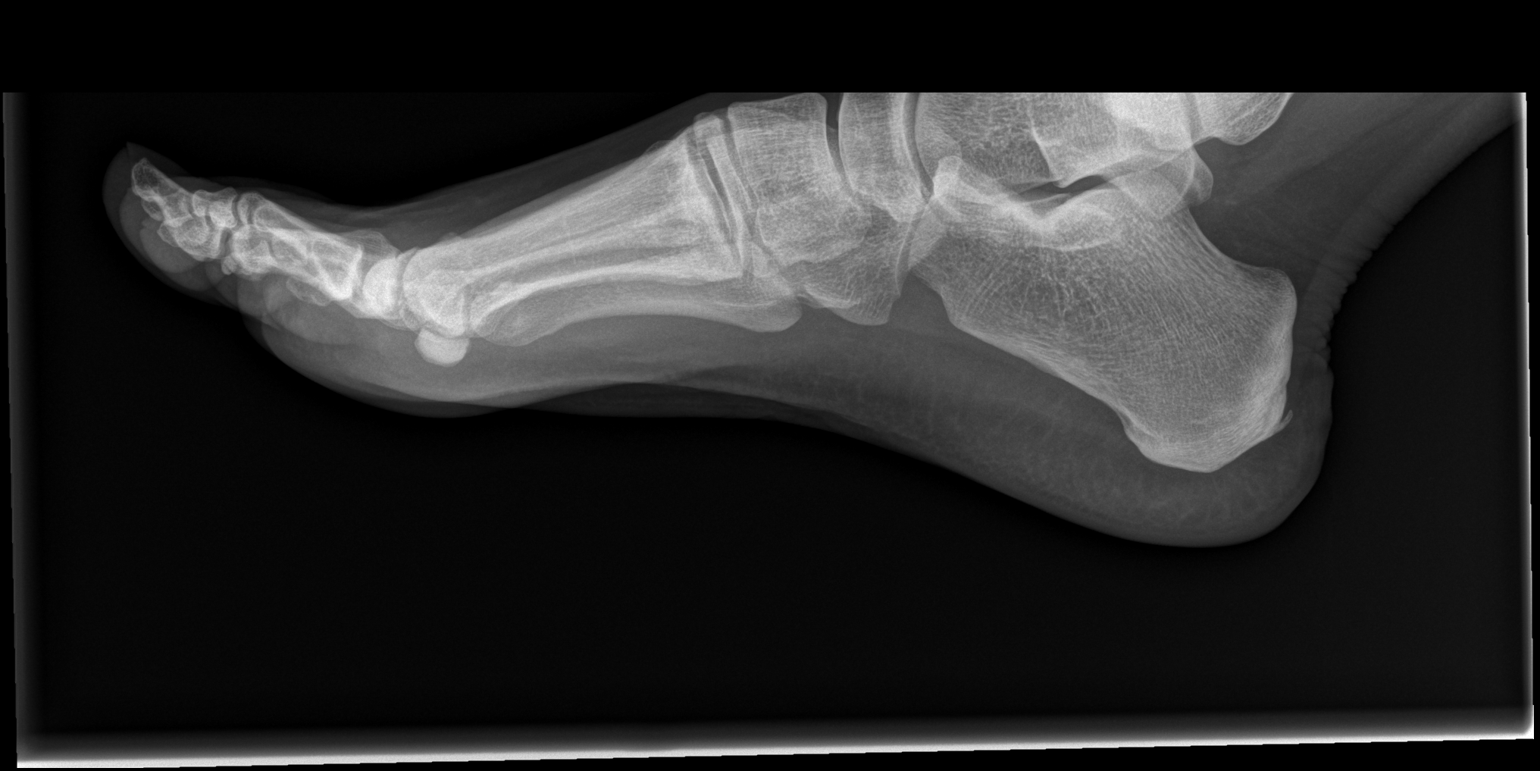

[3 of 3 positions shown; findings below may reference images not displayed]

FINDINGS: There is no evidence of fracture or dislocation. There is no
evidence of arthropathy or other focal bone abnormality. Soft
tissues are unremarkable.
IMPRESSION: Negative.

## 2022-01-11 DIAGNOSIS — R69 Illness, unspecified: Secondary | ICD-10-CM | POA: Diagnosis not present

## 2022-04-07 DIAGNOSIS — R69 Illness, unspecified: Secondary | ICD-10-CM | POA: Diagnosis not present

## 2022-06-03 DIAGNOSIS — M79671 Pain in right foot: Secondary | ICD-10-CM | POA: Diagnosis not present

## 2022-06-17 ENCOUNTER — Ambulatory Visit (HOSPITAL_COMMUNITY)
Admission: EM | Admit: 2022-06-17 | Discharge: 2022-06-17 | Disposition: A | Discharge status: Home / Self Care | Payer: 59 | Attending: Student | Admitting: Student

## 2022-06-17 ENCOUNTER — Encounter (HOSPITAL_COMMUNITY): Payer: Self-pay

## 2022-06-17 DIAGNOSIS — T63441A Toxic effect of venom of bees, accidental (unintentional), initial encounter: Secondary | ICD-10-CM

## 2022-06-17 DIAGNOSIS — T7840XA Allergy, unspecified, initial encounter: Secondary | ICD-10-CM | POA: Diagnosis not present

## 2022-06-17 MED ORDER — PREDNISONE 20 MG PO TABS: 40.0000 mg | ORAL_TABLET | Freq: Every day | ORAL | 0 refills | Status: AC

## 2022-06-20 ENCOUNTER — Other Ambulatory Visit: Payer: Self-pay | Admitting: Family Medicine

## 2022-06-20 DIAGNOSIS — M79604 Pain in right leg: Secondary | ICD-10-CM

## 2022-06-20 DIAGNOSIS — R609 Edema, unspecified: Secondary | ICD-10-CM

## 2022-06-23 ENCOUNTER — Other Ambulatory Visit: Payer: 59

## 2022-07-03 DIAGNOSIS — M79604 Pain in right leg: Secondary | ICD-10-CM | POA: Diagnosis not present

## 2022-09-16 DIAGNOSIS — Z23 Encounter for immunization: Secondary | ICD-10-CM | POA: Diagnosis not present

## 2023-01-28 DIAGNOSIS — Z01818 Encounter for other preprocedural examination: Secondary | ICD-10-CM | POA: Diagnosis not present

## 2023-02-03 DIAGNOSIS — M79671 Pain in right foot: Secondary | ICD-10-CM | POA: Diagnosis not present

## 2023-02-03 DIAGNOSIS — G8929 Other chronic pain: Secondary | ICD-10-CM | POA: Diagnosis not present

## 2023-02-03 DIAGNOSIS — G8918 Other acute postprocedural pain: Secondary | ICD-10-CM | POA: Diagnosis not present

## 2023-02-03 DIAGNOSIS — G5761 Lesion of plantar nerve, right lower limb: Secondary | ICD-10-CM | POA: Diagnosis not present

## 2023-02-03 DIAGNOSIS — Z87898 Personal history of other specified conditions: Secondary | ICD-10-CM | POA: Diagnosis not present

## 2023-02-03 DIAGNOSIS — M7989 Other specified soft tissue disorders: Secondary | ICD-10-CM | POA: Diagnosis not present

## 2023-02-03 DIAGNOSIS — M79604 Pain in right leg: Secondary | ICD-10-CM | POA: Diagnosis not present

## 2023-02-03 DIAGNOSIS — R52 Pain, unspecified: Secondary | ICD-10-CM | POA: Diagnosis not present

## 2023-03-10 ENCOUNTER — Ambulatory Visit: Payer: BC Managed Care – PPO | Admitting: Orthopedic Surgery

## 2023-03-10 ENCOUNTER — Encounter: Payer: Self-pay | Admitting: Orthopedic Surgery

## 2023-03-10 DIAGNOSIS — S88111A Complete traumatic amputation at level between knee and ankle, right lower leg, initial encounter: Secondary | ICD-10-CM

## 2023-03-10 DIAGNOSIS — G5771 Causalgia of right lower limb: Secondary | ICD-10-CM | POA: Diagnosis not present

## 2023-03-10 DIAGNOSIS — Z89511 Acquired absence of right leg below knee: Secondary | ICD-10-CM | POA: Diagnosis not present

## 2023-03-10 DIAGNOSIS — R52 Pain, unspecified: Secondary | ICD-10-CM

## 2023-03-10 MED ORDER — SUVOREXANT 10 MG PO TABS: 10.0000 mg | ORAL_TABLET | Freq: Every day | ORAL | 1 refills | Status: DC

## 2023-03-10 NOTE — Progress Notes (Signed)
Office Visit Note   Patient: Ashley Brandt           Date of Birth: [birth year]           MRN: [id number] Visit Date: 03/10/2023              Requested by: Antony Contras, MD [address] [place],  [place] [address] PCP: Antony Contras, MD  No chief complaint on file.     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  Her surgery was performed in [facility] on February 13 of this year.  She states this is a result of multiple surgeries which began with a more neuroma excision from which she then went on to develop painful neuromas and proceeded to have several surgeries.  She currently is taking 3600 mg of gabapentin daily for her neuropathic pain as well as her phantom pain she feels this is easing her pain some but has significant difficulty with her phantom pain limb pain difficulty sleeping due to phantom pain.  She has difficulty with onset as well as maintenance of sleep.  She has been using 2 to 3 mg of lorazepam as prescribed by her plastic surgeon which has been somewhat helpful with her sleep however she feels this is been less helpful recently would like to try something different she is considering trazodone.  She states she is also used Ambien in the past without relief  Patient is a new right transtibial  amputee.  Patient's current comorbidities are not expected to impact the ability to function with the prescribed prosthesis. Patient verbally communicates a strong desire to use a prosthesis. Patient currently requires mobility aids to ambulate without a prosthesis.  Expects not to use mobility aids with a new prosthesis.  Patient is a K3 level ambulator that spends a lot of time walking around on uneven terrain over obstacles, up and down stairs, and ambulates with a variable cadence.     Assessment &  Plan: Visit Diagnoses:  1. 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 Below-knee amputation of right lower extremity ([place])   3. Difficulty coping with pain     Plan: Will not prescribe trazodone as this will interact with her nortriptyline.  Will trial her on Meds ordered this encounter  Medications   Suvorexant 10 MG TABS    Sig: Take 1 tablet (10 mg total) by mouth daily.    Dispense:  30 tablet    Refill:  1    Discussed not using the benzos at the same time.  She will trial using the suvorexant only at bed.  Have also placed an order for physical therapy per her request sent an order to [facility] clinic for her prosthesis set up as well.  She is eager to get set up with a prosthesis to do her activities of daily living.  I have also placed a referral to counseling discussed since her trauma associated with her surgeries and healthcare troubles over the last few years  Follow-Up Instructions: Return in about 4 weeks (around 04/07/2023), or if symptoms worsen or fail to improve.   Ortho Exam  Patient is alert, oriented, no adenopathy, well-dressed, normal affect, normal respiratory effort. On examination right residual limb this is well consolidated well-healed there is no erythema no warmth  Imaging: No results found.  No images are attached to the encounter.  Labs: No results found for: "HGBA1C", "ESRSEDRATE", "CRP", "LABURIC", "REPTSTATUS", "GRAMSTAIN", "CULT", "LABORGA"   No results found for: "ALBUMIN", "PREALBUMIN", "CBC"  No results found for: "MG" No results found for: "VD25OH"  No results found for: "PREALBUMIN"     No data to display           There is no height or weight on file to calculate BMI.  Orders:  Orders Placed This Encounter  Procedures   Ambulatory referral to Psychology   Meds ordered this encounter  Medications   Suvorexant 10 MG TABS    Sig: Take 1 tablet (10 mg total) by mouth daily.    Dispense:  30 tablet     Refill:  1     Procedures: No procedures performed  Clinical Data: No additional findings.  ROS:  All other systems negative, except as noted in the HPI. Review of Systems  Objective: Vital Signs: There were no vitals taken for this visit.  Specialty Comments:  No specialty comments available.  PMFS History: Patient Active Problem List   Diagnosis Date Noted   Complex regional pain syndrome type 2 of right lower extremity 06/11/2021   Seasonal and perennial allergic rhinitis 02/14/2020   Food intolerance 02/14/2020   Past Medical History:  Diagnosis Date   Allergy     Family History  Problem Relation Age of Onset   Diabetes Mellitus II Mother    Heart Problems Father     Past Surgical History:  Procedure Laterality Date   BREAST REDUCTION SURGERY  2006   CESAREAN SECTION     CESAREAN SECTION  2000   Social History   Occupational History   Not on file  Tobacco Use   Smoking status: Never   Smokeless tobacco: Never  Vaping Use   Vaping Use: Never used  Substance and Sexual Activity   Alcohol use: Yes    Comment: social    Drug use: No   Sexual activity: Yes    Birth control/protection: Abstinence, None

## 2023-03-13 ENCOUNTER — Telehealth: Payer: Self-pay | Admitting: Family

## 2023-03-13 NOTE — Telephone Encounter (Addendum)
Patient received call advised the Rx is not in stock [facility]) Patient said she uses [facility] at [place] also said she uses [facility] [address].  Patient said she was suppose to be referred out to another provider. The number to contact patient is [phone]

## 2023-03-13 NOTE — Telephone Encounter (Signed)
Insomina medication that you refilled for pt. Please see message below and advise upon return.

## 2023-03-22 ENCOUNTER — Emergency Department (HOSPITAL_BASED_OUTPATIENT_CLINIC_OR_DEPARTMENT_OTHER): Payer: BC Managed Care – PPO | Admitting: Radiology

## 2023-03-22 ENCOUNTER — Other Ambulatory Visit: Payer: Self-pay

## 2023-03-22 ENCOUNTER — Encounter (HOSPITAL_BASED_OUTPATIENT_CLINIC_OR_DEPARTMENT_OTHER): Payer: Self-pay

## 2023-03-22 ENCOUNTER — Emergency Department (HOSPITAL_BASED_OUTPATIENT_CLINIC_OR_DEPARTMENT_OTHER)
Admission: EM | Admit: 2023-03-22 | Discharge: 2023-03-22 | Disposition: A | Discharge status: Home / Self Care | Payer: BC Managed Care – PPO | Attending: Emergency Medicine | Admitting: Emergency Medicine

## 2023-03-22 DIAGNOSIS — M7989 Other specified soft tissue disorders: Secondary | ICD-10-CM | POA: Diagnosis not present

## 2023-03-22 DIAGNOSIS — W19XXXA Unspecified fall, initial encounter: Secondary | ICD-10-CM | POA: Insufficient documentation

## 2023-03-22 DIAGNOSIS — M79604 Pain in right leg: Secondary | ICD-10-CM | POA: Diagnosis not present

## 2023-03-22 DIAGNOSIS — S8991XA Unspecified injury of right lower leg, initial encounter: Secondary | ICD-10-CM | POA: Diagnosis not present

## 2023-03-22 MED ORDER — OXYCODONE HCL 5 MG PO TABS
5.0000 mg | ORAL_TABLET | Freq: Once | ORAL | Status: AC
  Administered 2023-03-22: 5 mg via ORAL
  Filled 2023-03-22: qty 1

## 2023-03-22 MED ORDER — OXYCODONE HCL 5 MG PO TABS: 5.0000 mg | ORAL_TABLET | Freq: Four times a day (QID) | ORAL | 0 refills | Status: DC | PRN

## 2023-03-22 NOTE — ED Triage Notes (Signed)
Patient here POV from Home.  Endorses Fall 2 Weeks ago in which she was using her Crutches in her [place]. Attempted to fall left but did fall onto right leg.   Right Right BKA in February. No Head injury. No LOC.  NAD Noted during Triage. A&Ox4. GCS 15. Ambulatory with crutches.

## 2023-03-22 NOTE — ED Provider Notes (Signed)
[facility] Provider Note   CSN: [id number] Arrival date & time: 03/22/23  1119     History  Chief Complaint  Patient presents with   Ashley Brandt is a 52 y.o. female.  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  She had right BKA a couple weeks ago.  She fell and hit her right stump area about 2 weeks ago.  She has been having some increased neuropathic pain since then.  Has been using gabapentin and amitriptyline with little bit of improvement.  She denies any pain in the back of her legs, denies any redness or swelling.  She is having sharp shooting pain at times.  She is on Ativan as well.  Having a hard time sleeping with things.  She thought maybe she needed some imaging to make sure nothing was wrong.  The history is provided by the patient.       Home Medications Prior to Admission medications   Medication Sig Start Date End Date Taking? Authorizing Provider  oxyCODONE (ROXICODONE) 5 MG immediate release tablet Take 1 tablet (5 mg total) by mouth every 6 (six) hours as needed for up to 10 doses for breakthrough pain. 03/22/23  Yes Aariyana Manz, DO  Ascorbic Acid (VITAMIN C) 1000 MG tablet Take 1,000 mg by mouth daily.    [provider]  Beta Carotene (VITAMIN A) 25000 UNIT capsule Take 25,000 Units by mouth daily.    [provider]  cholecalciferol (VITAMIN D3) 25 MCG (1000 UNIT) tablet Take 5,000 Units by mouth daily.    [provider]  gabapentin (NEURONTIN) 600 MG tablet Take 600 mg by mouth 3 (three) times daily.    [provider]  magnesium 30 MG tablet Take 30 mg by mouth 2 (two) times daily. 120mg     [provider]  Multiple Vitamin (MULTIVITAMIN) tablet Take 1 tablet by mouth daily.    [provider]  nortriptyline (PAMELOR) 25 MG capsule Take 25 mg by mouth at bedtime.    [provider]  Omega-3 Fatty Acids (FISH OIL) 1000 MG CAPS Take  3,600 mg by mouth daily.    [provider]  Suvorexant 10 MG TABS Take 1 tablet (10 mg total) by mouth daily. 03/10/23   Suzan Slick, NP  vitamin B-12 (CYANOCOBALAMIN) 1000 MCG tablet Take 1,000 mcg by mouth daily. 500 mcg    [provider]      Allergies    Dust mite mixed allergen ext [mite (d. farinae)]    Review of Systems   Review of Systems  Physical Exam Updated Vital Signs BP 117/84 (BP Location: Right Arm) Comment: Simultaneous filing. User may not have seen previous data.  Pulse 75 Comment: Simultaneous filing. User may not have seen previous data.  Temp 98.3 F (36.8 C) (Oral)   Resp 18   SpO2 100% Comment: Simultaneous filing. User may not have seen previous data. Physical Exam Vitals and nursing note reviewed.  Constitutional:      General: She is not in acute distress.    Appearance: She is well-developed. She is not ill-appearing.  HENT:     Head: Normocephalic and atraumatic.     Mouth/Throat:     Mouth: Mucous membranes are moist.  Eyes:     Extraocular Movements: Extraocular movements intact.     Conjunctiva/sclera: Conjunctivae normal.     Pupils: Pupils are equal, round, and reactive to light.  Cardiovascular:     Rate and Rhythm: Normal rate and regular rhythm.     Pulses: Normal pulses.     Heart sounds: Normal heart sounds. No murmur heard. Pulmonary:     Effort: Pulmonary effort is normal. No respiratory distress.     Breath sounds: Normal breath sounds.  Abdominal:     Palpations: Abdomen is soft.     Tenderness: There is no abdominal tenderness.  Musculoskeletal:     Cervical back: Neck supple.     Comments: Right BKA area is clean dry and intact.  There is no cellulitis or hematoma or wound dehiscence  Skin:    General: Skin is warm and dry.     Capillary Refill: Capillary refill takes less than 2 seconds.  Neurological:     General: No focal deficit present.     Mental Status: She is alert.     Sensory: No sensory  deficit.     Motor: No weakness.     ED Results / Procedures / Treatments   Labs (all labs ordered are listed, but only abnormal results are displayed) Labs Reviewed - No data to display  EKG None  Radiology DG Tibia/Fibula Right  Result Date: 03/22/2023 CLINICAL DATA:  Fall 2 weeks ago. Below the knee amputation last month. EXAM: RIGHT TIBIA AND FIBULA - 2 VIEW COMPARISON:  Right foot radiographs 04/22/2021 FINDINGS: Postsurgical changes from below the knee amputation. The amputation margins of the distal remaining tibia and fibula are sharp. There is no evidence of acute fracture, dislocation or bone destruction. The joint spaces are preserved at the knee. No significant knee joint effusion. There are surgical clips in the distal tibia and surrounding soft tissues. There may be mild soft tissue swelling in the stump. IMPRESSION: No evidence of acute fracture, dislocation or osteomyelitis status post recent BKA. Possible mild soft tissue swelling in the stump. Electronically Signed   By: Richardean Sale M.D.   On: 03/22/2023 12:02    Procedures Procedures    Medications Ordered in ED Medications  oxyCODONE (Oxy IR/ROXICODONE) immediate release tablet 5 mg (5 mg Oral Given 03/22/23 1210)    ED Course/ Medical Decision Making/ A&P                             Medical Decision Making Amount and/or Complexity of Data Reviewed Radiology: ordered.  Risk Prescription drug management.   Ashley Brandt is here with pain in her right BKA.  Normal vitals.  No fever.  Patient fell and hit her right BKA area 2 weeks ago.  Is been having some neuropathic pain in the anterior portion of her stump since then.  She had amputation about 2 months ago.  There is no signs of infection on exam.  Well-appearing.  Sounds like she is dealing with more acute on chronic neuropathic pain since this injury.  X-ray today showed no fracture or abnormal swelling per my review and interpretation.  Overall  report by radiologist unremarkable.  Will put her on some oxycodone for breakthrough pain.  Have her follow-up with her chronic pain team.  She is using gabapentin and amitriptyline already.  Uses Ativan as needed as well.  Neurovascular neuromuscular intact.  No concern for blood clot.  Discharged in good condition.  This chart was dictated using voice recognition software.  Despite best efforts to proofread,  errors can occur which can change the documentation meaning.  Final Clinical Impression(s) / ED Diagnoses Final diagnoses:  Right leg pain    Rx / DC Orders ED Discharge Orders          Ordered    oxyCODONE (ROXICODONE) 5 MG immediate release tablet  Every 6 hours PRN        03/22/23 1222              Zannah Melucci, [place], DO 03/22/23 1222

## 2023-03-22 NOTE — ED Notes (Signed)
Dc instructions reviewed with patient. Patient voiced understanding. Dc with belongings.  °

## 2023-03-24 DIAGNOSIS — F4310 Post-traumatic stress disorder, unspecified: Secondary | ICD-10-CM | POA: Diagnosis not present

## 2023-03-30 DIAGNOSIS — F4310 Post-traumatic stress disorder, unspecified: Secondary | ICD-10-CM | POA: Diagnosis not present

## 2023-03-31 ENCOUNTER — Telehealth: Payer: Self-pay | Admitting: Family

## 2023-03-31 NOTE — Telephone Encounter (Signed)
Patient need pre Auth to pick up Suvorexant

## 2023-03-31 NOTE — Telephone Encounter (Signed)
Per Denny Peon, we will not do a prior authorization for this medication. She would like for pt to establish with a PCP or psychiatrist to manage this medication.

## 2023-03-31 NOTE — Telephone Encounter (Signed)
Did you call and advise the patient? Or do I need to

## 2023-03-31 NOTE — Telephone Encounter (Signed)
No maa'm this was a message to me, for when I could call pt back! She is informed. Thanks.

## 2023-04-06 DIAGNOSIS — F4310 Post-traumatic stress disorder, unspecified: Secondary | ICD-10-CM | POA: Diagnosis not present

## 2023-04-08 DIAGNOSIS — G5761 Lesion of plantar nerve, right lower limb: Secondary | ICD-10-CM | POA: Diagnosis not present

## 2023-04-08 DIAGNOSIS — G629 Polyneuropathy, unspecified: Secondary | ICD-10-CM | POA: Diagnosis not present

## 2023-04-14 DIAGNOSIS — F4310 Post-traumatic stress disorder, unspecified: Secondary | ICD-10-CM | POA: Diagnosis not present

## 2023-04-20 ENCOUNTER — Encounter: Payer: BC Managed Care – PPO | Admitting: Physical Therapy

## 2023-04-20 DIAGNOSIS — F4310 Post-traumatic stress disorder, unspecified: Secondary | ICD-10-CM | POA: Diagnosis not present

## 2023-04-27 DIAGNOSIS — F4310 Post-traumatic stress disorder, unspecified: Secondary | ICD-10-CM | POA: Diagnosis not present

## 2023-05-01 DIAGNOSIS — Z89511 Acquired absence of right leg below knee: Secondary | ICD-10-CM | POA: Diagnosis not present

## 2023-05-04 ENCOUNTER — Other Ambulatory Visit: Payer: Self-pay

## 2023-05-04 ENCOUNTER — Ambulatory Visit: Payer: BC Managed Care – PPO | Admitting: Physical Therapy

## 2023-05-04 ENCOUNTER — Encounter: Payer: Self-pay | Admitting: Physical Therapy

## 2023-05-04 DIAGNOSIS — R2689 Other abnormalities of gait and mobility: Secondary | ICD-10-CM

## 2023-05-04 DIAGNOSIS — M6281 Muscle weakness (generalized): Secondary | ICD-10-CM

## 2023-05-04 DIAGNOSIS — R2681 Unsteadiness on feet: Secondary | ICD-10-CM | POA: Diagnosis not present

## 2023-05-04 DIAGNOSIS — G546 Phantom limb syndrome with pain: Secondary | ICD-10-CM

## 2023-05-04 DIAGNOSIS — F4310 Post-traumatic stress disorder, unspecified: Secondary | ICD-10-CM | POA: Diagnosis not present

## 2023-05-04 DIAGNOSIS — M79661 Pain in right lower leg: Secondary | ICD-10-CM | POA: Diagnosis not present

## 2023-05-04 NOTE — Therapy (Signed)
OUTPATIENT PHYSICAL THERAPY PROSTHETICS EVALUATION   Patient Name: Ashley Brandt MRN: [id number] DOB:1971-02-06, 52 y.o., female Today's Date: 05/04/2023  PCP: Tally Joe, MD REFERRING PROVIDER: Barnie Del, NP  END OF SESSION:  PT End of Session - 05/04/23 1731     Visit Number 1    Number of Visits 20    Date for PT Re-Evaluation 08/20/23    Authorization Type [employer]    Authorization Time Period $30 co-pay, 30 visit limit    Authorization - Visit Number 1    Authorization - Number of Visits 30    Progress Note Due on Visit 10    PT Start Time 1100    PT Stop Time 1144    PT Time Calculation (min) 44 min    Activity Tolerance Patient limited by pain    Behavior During Therapy [facility] for tasks assessed/performed             Past Medical History:  Diagnosis Date   Allergy    Past Surgical History:  Procedure Laterality Date   BREAST REDUCTION SURGERY  2006   CESAREAN SECTION     CESAREAN SECTION  2000   Patient Active Problem List   Diagnosis Date Noted   Complex regional pain syndrome type 2 of right lower extremity 06/11/2021   Seasonal and perennial allergic rhinitis 02/14/2020   Food intolerance 02/14/2020    ONSET DATE: 05/01/2023 prosthesis delivery  REFERRING DIAG: J19.147W (ICD-10-CM) - Below-knee amputation of right lower extremity   THERAPY DIAG:  Pain in right lower leg  Phantom limb syndrome with pain (HCC)  Other abnormalities of gait and mobility  Unsteadiness on feet  Muscle weakness (generalized)  Rationale for Evaluation and Treatment: Rehabilitation  SUBJECTIVE:   SUBJECTIVE STATEMENT: Patient underwent right modified Below Knee Amputation with AMI construction on 02/03/2023. She had history of Morton's neuroma with pain limiting ambulatory capacity. She was hospitalized for 5 days.  Pain was under control until they took the nerve block out. She had pain but able to sleep.  She went to ED on 03/22/23 with RLE pain due to fall 2  weeks prior. She got prosthesis Friday, 05/01/2023 3 days ago.  She wore prosthesis 2.5 hours 1x/day Friday & Saturday and only 15 min Sunday.  Unable to tolerate 2nd wear each day.    PERTINENT HISTORY: none prior to Amputations  PAIN:  Are you having pain? Yes: NPRS scale: limb & phantom pain today 7/10 and over the last week 7/10 - 9/10;   Pain location: right leg / limb worse medially but hurts both along Sappenous & Peroneal nerves.  Pain description: electric shooting & shocks Aggravating factors: wearing prosthesis, compression helps but hurts Relieving factors: going to bed  PRECAUTIONS: None  WEIGHT BEARING RESTRICTIONS: No  FALLS: Has patient fallen in last 6 months? Yes. 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   PATIENT GOALS: to function without pain, be active, Yoga,   OBJECTIVE:  COGNITION: Overall  cognitive status: Within functional limits for tasks assessed   SENSATION: WFL  POSTURE: weight shift left  LOWER EXTREMITY ROM:  ROM P:passive  A:active Right eval Left eval  Hip flexion    Hip extension    Hip abduction    Hip adduction    Hip internal rotation    Hip external rotation    Knee flexion    Knee extension    Ankle dorsiflexion    Ankle plantarflexion    Ankle inversion    Ankle eversion     (Blank rows = not tested)  LOWER EXTREMITY MMT:  MMT Right eval Left eval  Hip flexion    Hip extension    Hip abduction     Hip adduction    Hip internal rotation    Hip external rotation    Knee flexion    Knee extension    Ankle dorsiflexion    Ankle plantarflexion    Ankle inversion    Ankle eversion    (Blank rows = not tested)  TRANSFERS: Sit to stand: SBA requires use of UEs & weight shift off prosthesis Stand to sit: SBA requires use of UEs & weight shift off prosthesis  GAIT: Gait pattern: step to pattern, decreased step length- Left, decreased stance time- Right, decreased hip/knee flexion- Right, Right hip hike, knee flexed in stance- Right, antalgic, trunk flexed, and abducted- Right Distance walked: 22' Assistive device utilized: Forearm Crutches and TTA prosthesis Level of assistance: SBA Comments: pain significantly limiting weight bearing on prosthesis.   FUNCTIONAL TESTs:  Static stance > 2 min but weight shift to left (off prosthesis)  CURRENT PROSTHETIC WEAR ASSESSMENT: Eval / 05/04/2023:  Patient is dependent with: skin check, residual limb care, care of non-amputated limb, prosthetic cleaning, ply sock cleaning, correct ply sock adjustment, proper wear schedule/adjustment, and proper weight-bearing schedule/adjustment Donning prosthesis: SBA Doffing prosthesis: SBA Prosthetic wear tolerance: 2.5 hours, 1x/day, for first 2 of 3 days since delivery but increased pain significantly.  Prosthetic weight bearing tolerance: 3 minutes with increased limb & phantom pain.  Edema: pitting with 5 sec refill Residual limb condition: no open areas, scar not adhered, tenderness with increased pain light touch along Saphenous & Peroneal nerves, good hair growth, no noted skin changes indicating Complex Regional Pain Syndrome,  Prosthetic description: gel liner, suction sleeve suspension,  K code/activity level with prosthetic use: Level 3    TODAY'S TREATMENT:                                                                                                                             DATE:   05/04/2023:  PATIENT EDUCATION: PATIENT EDUCATED ON FOLLOWING PROSTHETIC CARE: Education details: positioning in sitting to limit prosthesis dangling / compressing posterior distal thigh into chair,  Skin check, Prosthetic cleaning, Propper donning, Proper doffing, Proper wear schedule/adjustment, and Proper weight-bearing schedule/adjustment Prosthetic wear tolerance: 1 hours 3x/day, daily  Person educated: Patient Education method: Explanation, Demonstration, Tactile  cues, and Verbal cues Education comprehension: verbalized understanding, verbal cues required, tactile cues required, and needs further education  HOME EXERCISE PROGRAM:  ASSESSMENT:  CLINICAL IMPRESSION: Patient is a 52 y.o. female who was seen today for physical therapy evaluation and treatment for prosthetic training with right Transtibial Amputation.  Patient has severe residual limb pain and phantom pain limiting function including prosthetic use/wear.  She has limited weightbearing and standing tolerance on prosthesis.  Patient is dependent in prosthetic care and progression of wear.  Patient's balance and gait are impaired by limited weightbearing.  Patient would benefit from skilled PT to improve prosthesis use without significant pain limitations.  OBJECTIVE IMPAIRMENTS: Abnormal gait, decreased activity tolerance, decreased balance, decreased endurance, decreased knowledge of condition, decreased knowledge of use of DME, decreased mobility, difficulty walking, decreased strength, increased edema, prosthetic dependency , and pain.   ACTIVITY LIMITATIONS: carrying, lifting, bending, standing, sleeping, stairs, transfers, and locomotion level  PARTICIPATION LIMITATIONS: meal prep, cleaning, laundry, driving, community activity, and occupation  PERSONAL FACTORS: Past/current experiences, Time since onset of injury/illness/exacerbation, and pain issues  are also affecting patient's functional outcome.   REHAB POTENTIAL:  Good  CLINICAL DECISION MAKING: Evolving/moderate complexity  EVALUATION COMPLEXITY: Moderate   GOALS: Goals reviewed with patient? Yes  SHORT TERM GOALS: Target date: 06/04/2023  Patient donnes prosthesis modified independent, verbalizes proper cleaning & proper adjustment of ply socks. Baseline: SEE OBJECTIVE DATA Goal status: INITIAL 2.  Patient tolerates prosthesis >10 hrs total /day without skin issues or limb pain >5/10 after standing. Baseline: SEE OBJECTIVE DATA Goal status: INITIAL  3.  Patient able to tolerate standing weight bearing thru prosthesis for > 5 min with pain increasing </= 2 increments on 0-10 scale.  Baseline: SEE OBJECTIVE DATA Goal status: INITIAL  4. Patient ambulates 300' with crutches & prosthesis (PWB) safely modified independent. Baseline: SEE OBJECTIVE DATA Goal status: INITIAL  5. Patient negotiates ramps & curbs with forearm crutches & prosthesis and stairs step-to pattern with single rail & crutch modified independent.  Baseline: SEE OBJECTIVE DATA Goal status: INITIAL  LONG TERM GOALS: Target date: 08/20/2023  Patient demonstrates & verbalized understanding of prosthetic care to enable safe utilization of prosthesis. Baseline: SEE OBJECTIVE DATA Goal status: INITIAL  Patient tolerates prosthesis wear >90% of awake hours without skin issues or limb pain >3/10.  Baseline: SEE OBJECTIVE DATA Goal status: INITIAL  Functional Gait Assessment >/= 19/30 to indicate lower fall risk Baseline: SEE OBJECTIVE DATA Goal status: INITIAL  Patient ambulates >500' with prosthesis & cane or less independently Baseline: SEE OBJECTIVE DATA Goal status: INITIAL  Patient negotiates ramps, curbs & stairs with single rail with prosthesis or less independently. Baseline: SEE OBJECTIVE DATA Goal status: INITIAL  Patient demonstrates & verbalizes pain management techniques for limb & phantom pain.  Baseline: SEE OBJECTIVE DATA Goal status:  INITIAL  Patient reports limb & phantom pain </= 3/10 with above standing & gait activities.  Baseline: SEE OBJECTIVE DATA Goal status: INITIAL  PLAN:  PT FREQUENCY: 1-2x/week  PT DURATION: 16 weeks  PLANNED INTERVENTIONS: Therapeutic exercises, Therapeutic activity, Neuromuscular re-education, Balance training, Gait training, Patient/Family education, Self Care, Stair training, Prosthetic training, DME instructions, Aquatic Therapy, Dry Needling, Electrical stimulation, Cryotherapy, Moist heat, scar mobilization, Ultrasound, Ionotophoresis 4mg /ml Dexamethasone, Manual therapy, and Re-evaluation  PLAN FOR NEXT SESSION:  check pain with wear and increase as tolerated, instruct in adjusting ply socks, standing balance activities with controlled weight bearing   Vladimir Faster, PT, DPT 05/04/2023, 6:21 PM

## 2023-05-06 DIAGNOSIS — G546 Phantom limb syndrome with pain: Secondary | ICD-10-CM | POA: Diagnosis not present

## 2023-05-06 DIAGNOSIS — M79609 Pain in unspecified limb: Secondary | ICD-10-CM | POA: Diagnosis not present

## 2023-05-06 DIAGNOSIS — T8789 Other complications of amputation stump: Secondary | ICD-10-CM | POA: Diagnosis not present

## 2023-05-07 ENCOUNTER — Ambulatory Visit: Payer: BC Managed Care – PPO | Admitting: Physical Therapy

## 2023-05-07 ENCOUNTER — Encounter: Payer: Self-pay | Admitting: Physical Therapy

## 2023-05-07 DIAGNOSIS — G546 Phantom limb syndrome with pain: Secondary | ICD-10-CM | POA: Diagnosis not present

## 2023-05-07 DIAGNOSIS — R2681 Unsteadiness on feet: Secondary | ICD-10-CM

## 2023-05-07 DIAGNOSIS — R2689 Other abnormalities of gait and mobility: Secondary | ICD-10-CM

## 2023-05-07 DIAGNOSIS — M6281 Muscle weakness (generalized): Secondary | ICD-10-CM

## 2023-05-07 DIAGNOSIS — M79661 Pain in right lower leg: Secondary | ICD-10-CM

## 2023-05-07 NOTE — Therapy (Addendum)
PHYSICAL THERAPY DISCHARGE SUMMARY  Visits from Start of Care: 2  Current functional level related to goals / functional outcomes: Unknown as patient has not returned since 05/07/2023.   Remaining deficits: unknown   Education / Equipment: Prosthetic care   Patient agrees to discharge. Patient goals were not met. Patient is being discharged due to not returning since the last visit.  Vladimir Faster, PT, DPT PT Specializing in Prosthetics & Orthotics 07/13/23 9:03 AM Phone:  [phone]  Fax:  [phone] [facility] [address] [place], [place] [address]   OUTPATIENT PHYSICAL THERAPY PROSTHETICS TREATMENT   Patient Name: Ashley Brandt MRN: [id number] DOB:December 04, 1971, 52 y.o., female Today's Date: 05/07/2023  PCP: Tally Joe, MD REFERRING PROVIDER: Barnie Del, NP  END OF SESSION:  PT End of Session - 05/07/23 1257     Visit Number 2    Number of Visits 20    Date for PT Re-Evaluation 08/20/23    Authorization Type [employer]    Authorization Time Period $30 co-pay, 30 visit limit    Authorization - Visit Number 2    Authorization - Number of Visits 30    Progress Note Due on Visit 10    PT Start Time 1300    PT Stop Time 1346    PT Time Calculation (min) 46 min    Activity Tolerance Patient limited by pain    Behavior During Therapy [facility] for tasks assessed/performed              Past Medical History:  Diagnosis Date   Allergy    Past Surgical History:  Procedure Laterality Date   BREAST REDUCTION SURGERY  2006   CESAREAN SECTION     CESAREAN SECTION  2000   Patient Active Problem List   Diagnosis Date Noted   Complex regional pain syndrome type 2 of right lower extremity 06/11/2021   Seasonal and perennial allergic rhinitis 02/14/2020   Food intolerance 02/14/2020    ONSET DATE: 05/01/2023 prosthesis delivery  REFERRING DIAG: I34.742V (ICD-10-CM) - Below-knee amputation of right lower extremity   THERAPY DIAG:   Pain in right lower leg  Phantom limb syndrome with pain (HCC)  Unsteadiness on feet  Muscle weakness (generalized)  Other abnormalities of gait and mobility  Rationale for Evaluation and Treatment: Rehabilitation  SUBJECTIVE:   SUBJECTIVE STATEMENT: Monday wore prosthesis 1 hour 4x. Tuesday was rainy & her limb was swollen unable to wear.  Wednesday 1 hour & today/Thursday 1 hour this morning.    PERTINENT HISTORY: none prior to Amputations  PAIN:  Are you having pain? Yes: NPRS scale: limb & phantom pain today  6-7/10 and since last PT lowest 3-4/10 and highest 8-9/10;   Pain location: right leg / limb worse medially but hurts both along Sappenous & Peroneal nerves.  Pain description: electric shooting & shocks Aggravating factors: wearing prosthesis, compression helps but hurts Relieving factors: going to bed  PRECAUTIONS: None  WEIGHT BEARING RESTRICTIONS: No  FALLS: Has patient fallen in last 6 months? Yes. 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   PATIENT GOALS: to function without pain, be active, Yoga,   OBJECTIVE:  COGNITION: Overall cognitive status: Within functional limits for tasks assessed   SENSATION: WFL  POSTURE: weight shift left  LOWER EXTREMITY ROM:  ROM P:passive  A:active Right eval Left eval  Hip flexion    Hip extension    Hip abduction    Hip adduction    Hip internal rotation    Hip external rotation     Knee flexion    Knee extension    Ankle dorsiflexion    Ankle plantarflexion    Ankle inversion    Ankle eversion     (Blank rows = not tested)  LOWER EXTREMITY MMT:  MMT Right eval Left eval  Hip flexion    Hip extension    Hip abduction    Hip adduction    Hip internal rotation    Hip external rotation    Knee flexion    Knee extension    Ankle dorsiflexion    Ankle plantarflexion    Ankle inversion    Ankle eversion    (Blank rows = not tested)  TRANSFERS: Sit to stand: SBA requires use of UEs & weight shift off prosthesis Stand to sit: SBA requires use of UEs & weight shift off prosthesis  GAIT: Gait pattern: step to pattern, decreased step length- Left, decreased stance time- Right, decreased hip/knee flexion- Right, Right hip hike, knee flexed in stance- Right, antalgic, trunk flexed, and abducted- Right Distance walked: 91' Assistive device utilized: Forearm Crutches and TTA prosthesis Level of assistance: SBA Comments: pain significantly limiting weight bearing on prosthesis.   FUNCTIONAL TESTs:  Static stance > 2 min but weight shift to left (off prosthesis)  CURRENT PROSTHETIC WEAR ASSESSMENT: Eval / 05/04/2023:  Patient is dependent with: skin check, residual limb care, care of non-amputated limb, prosthetic cleaning, ply sock cleaning, correct ply sock adjustment, proper wear schedule/adjustment, and proper weight-bearing schedule/adjustment Donning prosthesis: SBA Doffing prosthesis: SBA Prosthetic wear tolerance: 2.5 hours, 1x/day, for first 2 of 3 days since delivery but increased pain significantly.  Prosthetic weight bearing tolerance: 3 minutes with increased limb & phantom pain.  Edema: pitting with 5 sec refill Residual limb condition: no open areas, scar not adhered, tenderness with increased pain light touch along Saphenous & Peroneal nerves, good hair growth, no noted skin changes indicating Complex Regional Pain Syndrome,  Prosthetic  description: gel liner, suction sleeve suspension,  K code/activity level with prosthetic use: Level 3    TODAY'S TREATMENT:                                                                                                                             DATE:  05/07/2023: Prosthetic Training with Transtibial Prosthesis: PT issued nylon stocking cutoff below tibial plateau to try as underliner. Shorter / cutoff with enable  some contact with skin & limit slippage. Pt verbalized understanding.  PT recommending wear 1 hour 3x/day with >/= 2 hours off between wears.  Pt verbalized understanding.  PT demo & verbal cues on multi-modal sensory to residual limb for 1-2 min each: light touch brushing tissue over limb, tapping / vibration, light scratching & warm wash cloth.  Pt verbalized understanding.  PT instructed in isometric muscle sets for gastroc (lightly depress gas pedal) and hamstring. Can do BLEs or alternate LEs to facilitate RLE muscle contractions. PT recommended 3 sec hold 10 reps 3x/day.  Nustep seat 6 level 5 with LLE & BUEs with RLE prosthesis off moving as if depressing pedal. PT working on activity to facilitate blood flow to limb. 3 min.  Prosthetic gait with bil. Forearm crutches: equal step length with heel just past toe of contralateral LE due to slow speed and to enable weight directly over prosthesis in stance. She amb 6' X 2 with verbal cues.  PT instructed in neg stairs with single rail (right ascend) and one forearm crutch. Pt performed 4 steps with SBA / cues.     05/04/2023:  PATIENT EDUCATION: PATIENT EDUCATED ON FOLLOWING PROSTHETIC CARE: Education details: positioning in sitting to limit prosthesis dangling / compressing posterior distal thigh into chair,  Skin check, Prosthetic cleaning, Propper donning, Proper doffing, Proper wear schedule/adjustment, and Proper weight-bearing schedule/adjustment Prosthetic wear tolerance: 1 hours 3x/day, daily  Person educated:  Patient Education method: Explanation, Demonstration, Tactile cues, and Verbal cues Education comprehension: verbalized understanding, verbal cues required, tactile cues required, and needs further education  HOME EXERCISE PROGRAM:  ASSESSMENT:  CLINICAL IMPRESSION: PT session focused on pain management with prosthesis use.  She appears to understand PT recommendations for wear, sensory overload to limb, isometric muscle contractions and prosthetic gait with weight bearing directly over prosthesis in stance.   OBJECTIVE IMPAIRMENTS: Abnormal gait, decreased activity tolerance, decreased balance, decreased endurance, decreased knowledge of condition, decreased knowledge of use of DME, decreased mobility, difficulty walking, decreased strength, increased edema, prosthetic dependency , and pain.   ACTIVITY LIMITATIONS: carrying, lifting, bending, standing, sleeping, stairs, transfers, and locomotion level  PARTICIPATION LIMITATIONS: meal prep, cleaning, laundry, driving, community activity, and occupation  PERSONAL FACTORS: Past/current experiences, Time since onset of injury/illness/exacerbation, and pain issues  are also affecting patient's functional outcome.   REHAB POTENTIAL: Good  CLINICAL DECISION MAKING: Evolving/moderate complexity  EVALUATION COMPLEXITY: Moderate   GOALS: Goals reviewed with patient? Yes  SHORT TERM GOALS: Target date: 06/04/2023  Patient donnes prosthesis modified independent, verbalizes proper cleaning & proper adjustment of ply socks. Baseline: SEE OBJECTIVE DATA Goal status: INITIAL 2.  Patient tolerates prosthesis >10 hrs total /day without skin issues or limb pain >5/10 after standing. Baseline: SEE OBJECTIVE DATA Goal status: INITIAL  3.  Patient able to tolerate standing weight bearing thru prosthesis for > 5 min with pain increasing </= 2 increments on 0-10 scale.  Baseline: SEE OBJECTIVE DATA Goal status: INITIAL  4. Patient ambulates 300'  with crutches & prosthesis (PWB) safely modified independent. Baseline: SEE OBJECTIVE DATA Goal status: INITIAL  5. Patient negotiates ramps & curbs with forearm crutches & prosthesis and stairs step-to pattern with single rail & crutch modified independent.  Baseline: SEE OBJECTIVE DATA Goal status: INITIAL  LONG TERM GOALS: Target date: 08/20/2023  Patient demonstrates & verbalized understanding of prosthetic care to enable safe utilization of prosthesis. Baseline: SEE OBJECTIVE DATA Goal status: INITIAL  Patient tolerates prosthesis wear >90% of awake hours without skin  issues or limb pain >3/10.  Baseline: SEE OBJECTIVE DATA Goal status: INITIAL  Functional Gait Assessment >/= 19/30 to indicate lower fall risk Baseline: SEE OBJECTIVE DATA Goal status: INITIAL  Patient ambulates >500' with prosthesis & cane or less independently Baseline: SEE OBJECTIVE DATA Goal status: INITIAL  Patient negotiates ramps, curbs & stairs with single rail with prosthesis or less independently. Baseline: SEE OBJECTIVE DATA Goal status: INITIAL  Patient demonstrates & verbalizes pain management techniques for limb & phantom pain.  Baseline: SEE OBJECTIVE DATA Goal status: INITIAL  Patient reports limb & phantom pain </= 3/10 with above standing & gait activities.  Baseline: SEE OBJECTIVE DATA Goal status: INITIAL  PLAN:  PT FREQUENCY: 1-2x/week  PT DURATION: 16 weeks  PLANNED INTERVENTIONS: Therapeutic exercises, Therapeutic activity, Neuromuscular re-education, Balance training, Gait training, Patient/Family education, Self Care, Stair training, Prosthetic training, DME instructions, Aquatic Therapy, Dry Needling, Electrical stimulation, Cryotherapy, Moist heat, scar mobilization, Ultrasound, Ionotophoresis 4mg /ml Dexamethasone, Manual therapy, and Re-evaluation  PLAN FOR NEXT SESSION:  continue to check pain with wear and increase as tolerated, continue instruction in adjusting ply socks,  standing balance activities with controlled weight bearing   Vladimir Faster, PT, DPT 05/07/2023, 2:27 PM

## 2023-05-11 DIAGNOSIS — F4310 Post-traumatic stress disorder, unspecified: Secondary | ICD-10-CM | POA: Diagnosis not present

## 2023-05-12 ENCOUNTER — Encounter: Payer: BC Managed Care – PPO | Admitting: Physical Therapy

## 2023-05-14 ENCOUNTER — Encounter: Payer: BC Managed Care – PPO | Admitting: Physical Therapy

## 2023-05-15 DIAGNOSIS — G588 Other specified mononeuropathies: Secondary | ICD-10-CM | POA: Diagnosis not present

## 2023-05-15 DIAGNOSIS — G629 Polyneuropathy, unspecified: Secondary | ICD-10-CM | POA: Diagnosis not present

## 2023-05-19 ENCOUNTER — Encounter: Payer: BC Managed Care – PPO | Admitting: Physical Therapy

## 2023-05-25 ENCOUNTER — Encounter: Payer: BC Managed Care – PPO | Admitting: Physical Therapy

## 2023-05-27 ENCOUNTER — Encounter: Payer: BC Managed Care – PPO | Admitting: Physical Therapy

## 2023-06-01 ENCOUNTER — Encounter: Payer: BC Managed Care – PPO | Admitting: Physical Therapy

## 2023-06-08 ENCOUNTER — Encounter: Payer: BC Managed Care – PPO | Admitting: Physical Therapy

## 2023-06-11 DIAGNOSIS — M79661 Pain in right lower leg: Secondary | ICD-10-CM | POA: Diagnosis not present

## 2023-06-11 DIAGNOSIS — Z89511 Acquired absence of right leg below knee: Secondary | ICD-10-CM | POA: Diagnosis not present

## 2023-06-11 DIAGNOSIS — M792 Neuralgia and neuritis, unspecified: Secondary | ICD-10-CM | POA: Diagnosis not present

## 2023-06-12 DIAGNOSIS — T8789 Other complications of amputation stump: Secondary | ICD-10-CM | POA: Diagnosis not present

## 2023-06-12 DIAGNOSIS — Z89511 Acquired absence of right leg below knee: Secondary | ICD-10-CM | POA: Diagnosis not present

## 2023-06-12 DIAGNOSIS — F418 Other specified anxiety disorders: Secondary | ICD-10-CM | POA: Diagnosis not present

## 2023-06-12 DIAGNOSIS — Z9889 Other specified postprocedural states: Secondary | ICD-10-CM | POA: Diagnosis not present

## 2023-06-12 DIAGNOSIS — M792 Neuralgia and neuritis, unspecified: Secondary | ICD-10-CM | POA: Diagnosis not present

## 2023-06-12 DIAGNOSIS — G588 Other specified mononeuropathies: Secondary | ICD-10-CM | POA: Diagnosis not present

## 2023-06-12 DIAGNOSIS — F1290 Cannabis use, unspecified, uncomplicated: Secondary | ICD-10-CM | POA: Diagnosis not present

## 2023-06-13 DIAGNOSIS — F418 Other specified anxiety disorders: Secondary | ICD-10-CM | POA: Diagnosis not present

## 2023-06-13 DIAGNOSIS — Z89511 Acquired absence of right leg below knee: Secondary | ICD-10-CM | POA: Diagnosis not present

## 2023-06-13 DIAGNOSIS — F1290 Cannabis use, unspecified, uncomplicated: Secondary | ICD-10-CM | POA: Diagnosis not present

## 2023-06-13 DIAGNOSIS — G588 Other specified mononeuropathies: Secondary | ICD-10-CM | POA: Diagnosis not present

## 2023-06-13 DIAGNOSIS — Z9889 Other specified postprocedural states: Secondary | ICD-10-CM | POA: Diagnosis not present

## 2023-06-14 DIAGNOSIS — F418 Other specified anxiety disorders: Secondary | ICD-10-CM | POA: Diagnosis not present

## 2023-06-14 DIAGNOSIS — F1290 Cannabis use, unspecified, uncomplicated: Secondary | ICD-10-CM | POA: Diagnosis not present

## 2023-06-14 DIAGNOSIS — G588 Other specified mononeuropathies: Secondary | ICD-10-CM | POA: Diagnosis not present

## 2023-06-14 DIAGNOSIS — Z9889 Other specified postprocedural states: Secondary | ICD-10-CM | POA: Diagnosis not present

## 2023-06-14 DIAGNOSIS — Z89511 Acquired absence of right leg below knee: Secondary | ICD-10-CM | POA: Diagnosis not present

## 2023-06-15 ENCOUNTER — Encounter: Payer: BC Managed Care – PPO | Admitting: Physical Therapy

## 2023-06-23 DIAGNOSIS — M792 Neuralgia and neuritis, unspecified: Secondary | ICD-10-CM | POA: Diagnosis not present

## 2023-07-09 DIAGNOSIS — Z1212 Encounter for screening for malignant neoplasm of rectum: Secondary | ICD-10-CM | POA: Diagnosis not present

## 2023-07-09 DIAGNOSIS — Z1211 Encounter for screening for malignant neoplasm of colon: Secondary | ICD-10-CM | POA: Diagnosis not present

## 2023-08-28 ENCOUNTER — Encounter
Payer: BC Managed Care – PPO | Attending: Physical Medicine and Rehabilitation | Admitting: Physical Medicine and Rehabilitation

## 2023-08-28 ENCOUNTER — Encounter: Payer: Self-pay | Admitting: Physical Medicine and Rehabilitation

## 2023-08-28 VITALS — BP 100/70 | HR 85 | Ht 62.5 in | Wt 128.0 lb

## 2023-08-28 DIAGNOSIS — R262 Difficulty in walking, not elsewhere classified: Secondary | ICD-10-CM | POA: Diagnosis not present

## 2023-08-28 DIAGNOSIS — F411 Generalized anxiety disorder: Secondary | ICD-10-CM | POA: Insufficient documentation

## 2023-08-28 DIAGNOSIS — G629 Polyneuropathy, unspecified: Secondary | ICD-10-CM | POA: Insufficient documentation

## 2023-08-28 DIAGNOSIS — G5771 Causalgia of right lower limb: Secondary | ICD-10-CM | POA: Insufficient documentation

## 2023-08-28 DIAGNOSIS — G4701 Insomnia due to medical condition: Secondary | ICD-10-CM | POA: Insufficient documentation

## 2023-08-28 MED ORDER — NORTRIPTYLINE HCL 10 MG PO CAPS: 10.0000 mg | ORAL_CAPSULE | Freq: Every day | ORAL | 3 refills | Status: DC

## 2023-08-28 MED ORDER — DULOXETINE HCL 20 MG PO CPEP: 20.0000 mg | ORAL_CAPSULE | Freq: Every day | ORAL | 3 refills | Status: DC

## 2023-08-28 MED ORDER — BUSPIRONE HCL 5 MG PO TABS: 5.0000 mg | ORAL_TABLET | Freq: Three times a day (TID) | ORAL | 3 refills | Status: DC | PRN

## 2023-08-28 NOTE — Patient Instructions (Signed)
Insomnia: -Try to go outside near sunrise -Get exercise during the day.  -Turn off all devices an hour before bedtime.  -Teas that can benefit: chamomile, valerian root, Brahmi (Bacopa) -Can consider over the counter melatonin, magnesium, and/or L-theanine. Melatonin is an anti-oxidant with multiple health benefits. Magnesium is involved in greater than 300 enzymatic reactions in the body and most of us are deficient as our soil is often depleted. There are 7 different types of magnesium- Bioptemizer's is a supplement with all 7 types, and each has unique benefits. Magnesium can also help with constipation and anxiety.  -Pistachios naturally increase the production of melatonin -[employer] bamboo bed sheets are free from toxic chemicals.  -Tart cherry juice or a tart cherry supplement can improve sleep and soreness post-workout   

## 2023-08-28 NOTE — Progress Notes (Signed)
Subjective:    Patient ID: Ashley Brandt, female    DOB: July 24, 1971, 52 y.o.   MRN: [id number]  HPI CC: RIght foot /toe pain  51 year old female referred by her primary care physician for the evaluation of right foot and toe pain.  The patient states that she has 2 types of pain 1 that is on the bottom of the foot right around the area of the surgery the other area is more in the middle of the foot.  She really has few symptoms at the ankle or above.  She notes mild swelling in the right foot sometimes discoloration as well.  It is very hypersensitive to touch particularly on the plantar surface. 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  She was started on Lyrica and had some side effects and did not wish to continue taking it.  The patient also had a behavioral health evaluation 04/02/2021 diagnosed with insomnia and adjustment disorder with mixed anxiety depressed mood.  She tried Ambien and melatonin.  She has been on Cymbalta 30 mg up to 60 mg a day.  She is now on gabapentin 600 mg 3 times per day.  The patient has had a second opinion with orthopedic surgeon.  The patient has had an arterial Doppler on 04/23/2021 which was normal.  She is scheduling surgery at [facility] with a peripheral neurosurgeon with plans for resection of neuroma as well as wrapping the end of the neuroma with abdominal epidermis to prevent reforming  -she has always been active and started to run a lot more during the pandemic. 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 She has had more pain and less function.   -she currently feels a burning pain in her residual limb  -she had muscle reintigration  -sometimes she feels a burning pain in her foot as well  Pain interferes with sleep at night  -prior to amputation she has tried bisphosphonate infusions,  lumbar sympathetic nerve block, gabapentin, nortriptyline     CLINICAL DATA:  Severe plantar foot pain since foot surgery 3-4 weeks ago for removal of Morton's neuroma (on 01/24/2021).   EXAM: MRI OF THE RIGHT FOREFOOT WITHOUT CONTRAST   TECHNIQUE: Multiplanar, multisequence MR imaging of the right forefoot was performed. No intravenous contrast was administered.   COMPARISON:  Office foot radiographs 11/12/2020. No previous MRI available.   FINDINGS: Bones/Joint/Cartilage   Mild degenerative changes of the 1st metatarsophalangeal joint with a small subchondral cyst in the 1st metatarsal head and a small joint effusion. No other significant arthropathic changes. There is no evidence of acute fracture, dislocation or avascular necrosis.   Ligaments   The Lisfranc ligament is intact. The collateral ligaments of the metatarsophalangeal joints appear intact.   Muscles and Tendons   There is focal susceptibility artifact within the plantar soft tissues at the level of the 2nd and 3rd metatarsal necks, presumably postsurgical. There is mild surrounding soft tissue edema as well as trace fluid within the flexor tendon sheaths of the 2nd and 3rd digits. The interosseous muscles are mildly edematous. There is additional ill-defined low signal between the 2nd and 3rd metatarsal necks, also likely postsurgical. No drainable fluid collection.   Soft tissues   The plantar soft tissues are deformed by a capsule which was placed along the plantar aspect of the 3rd metatarsal  head. As above, there is underlying soft tissue edema of and presumed postsurgical susceptibility artifact. No drainable fluid collection or soft tissue mass identified.   IMPRESSION: 1. Nonspecific soft tissue edema and susceptibility artifact in the plantar forefoot soft tissues, attributed to recent surgery. Possible mild flexor tenosynovitis of the 2nd and 3rd rays. 2. No drainable fluid collection. 3.  First MTP joint degenerative changes and small effusion. No acute osseous findings.     Electronically Signed   By: Carey Bullocks M.D.   On: 04/08/2021 09:41 Pain Inventory Average Pain 7 Pain Right Now 5 My pain is sharp, burning, and stabbing  In the last 24 hours, has pain interfered with the following? General activity 7 Relation with others 8 Enjoyment of life 8 What TIME of day is your pain at its worst? evening Sleep (in general) Fair  Pain is worse with: walking, sitting, and standing Pain improves with: rest, pacing activities, and medication Relief from Meds: 6  walk without assistance how many minutes can you walk? 5-10 ability to climb steps?  yes do you drive?  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/21 I need assistance with the following:  meal prep, household duties, and shopping  trouble walking dizziness anxiety  New pt  New pt    Family History  Problem Relation Age of Onset   Diabetes Mellitus II Mother    Heart Problems Father    Social History   Socioeconomic History   Marital status: Married    Spouse name: Not on file   Number of children: Not on file   Years of education: Not on file   Highest education level: Not on file  Occupational History   Not on file  Tobacco Use   Smoking status: Never   Smokeless tobacco: Never  Vaping Use   Vaping status: Never Used  Substance and Sexual Activity   Alcohol use: Yes    Comment: social    Drug use: No   Sexual activity: Yes    Birth control/protection: Abstinence, None  Other Topics Concern   Not on file  Social History Narrative   Not on file   Social Determinants of Health   Financial Resource Strain: Low Risk  (09/02/2021)   Received from [facility], [facility]   Overall Financial Resource Strain (CARDIA)    Difficulty of Paying Living Expenses: Not very hard  Food Insecurity: No Food Insecurity (09/02/2021)   Received from [facility], [facility]   Hunger Vital Sign    Worried About Running Out of Food in the Last Year: Never true    Ran Out of Food in the Last Year: Never true  Transportation Needs: No Transportation Needs (09/02/2021)   Received from [facility], [facility]   PRAPARE - Transportation    Lack of Transportation (Medical): No    Lack of Transportation (Non-Medical): No  Physical Activity: Inactive (09/02/2021)   Received from [facility], [facility]   Exercise Vital Sign    Days of Exercise per Week: 0 days    Minutes of Exercise per Session: 0 min  Stress: Stress Concern Present (09/02/2021)   Received from [facility], [facility] of Occupational Health - Occupational Stress Questionnaire    Feeling of Stress : Very much  Social Connections: Unknown (04/23/2022)   Received from [facility], [facility]   Social Network    Social Network: Not on file  Past Surgical History:  Procedure Laterality Date   BREAST REDUCTION SURGERY  2006   CESAREAN SECTION     CESAREAN SECTION  2000   Past Medical History:  Diagnosis Date   Allergy    There were no vitals taken for this visit.  Opioid Risk Score:   Fall Risk Score:  `1  Depression screen [facility] 2/9     06/11/2021    2:53 PM 01/20/2016   11:27 AM  Depression screen PHQ 2/9  Decreased Interest 1 0  Down, Depressed, Hopeless 1 0  PHQ - 2 Score 2 0  Altered sleeping 1   Tired, decreased energy 1   Change in appetite 0   Feeling bad or failure about yourself  1   Trouble concentrating 1   Moving slowly or fidgety/restless 1   Suicidal thoughts 0   PHQ-9 Score 7   Difficult doing work/chores Very difficult      Review of Systems  Musculoskeletal:  Positive for gait problem.       Foot pain  Neurological:  Positive for dizziness.  All other systems reviewed and are negative.     Objective:   Physical Exam Vitals and nursing note reviewed.  Constitutional:      Appearance: She is  normal weight.  HENT:     Head: Normocephalic and atraumatic.  Eyes:     Extraocular Movements: Extraocular movements intact.     Conjunctiva/sclera: Conjunctivae normal.     Pupils: Pupils are equal, round, and reactive to light.  Cardiovascular:     Rate and Rhythm: Normal rate and regular rhythm.     Heart sounds: Normal heart sounds.  Pulmonary:     Effort: Pulmonary effort is normal. No respiratory distress.     Breath sounds: Normal breath sounds.  Abdominal:     General: Abdomen is flat. Bowel sounds are normal. There is no distension.     Palpations: Abdomen is soft.  Musculoskeletal:     Cervical back: Normal range of motion.     Right foot: No deformity, bunion, Charcot foot or foot drop.     Left foot: No deformity, bunion, Charcot foot or foot drop.  Feet:     Right foot:     Skin integrity: Skin integrity normal.     Toenail Condition: Right toenails are normal.     Left foot:     Skin integrity: Skin integrity normal.     Toenail Condition: Left toenails are normal.     Comments: Right foot amputation  Skin:    General: Skin is warm and dry.  Neurological:     Mental Status: She is alert and oriented to person, place, and time.     Cranial Nerves: No dysarthria.     Sensory: No sensory deficit.     Motor: No weakness, tremor, atrophy or abnormal muscle tone.     Coordination: Coordination is intact.     Comments: Antalgic gait with reduced weight bearing R forefoot  Psychiatric:        Mood and Affect: Mood normal.        Behavior: Behavior normal.         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 Discussed that this is a capsaicin patch, stronger than capsaicin  cream. Discussed that it is currently approved  for diabetic peripheral neuropathy and post-herpetic neuralgia, but that it has also shown benefit in treating other forms of neuropathy. Provided patient with link to site to learn more about the patch: https://www.clark.biz/. Discussed that the patch would be placed in office and benefits usually last 3 months. Discussed that unintended exposure to capsaicin can cause severe irritation of eyes, mucous membranes, respiratory tract, and skin, but that Qutenza is a local treatment and does not have the systemic side effects of other nerve medications. Discussed that there may be pain, itching, erythema, and decreased sensory function associated with the application of Qutenza. Side effects usually subside within 1 week. A cold pack of analgesic medications can help with these side effects. Blood pressure can also be increased due to pain associated with administration of the patch.   -Provided with a pain relief journal and discussed that it contains foods and lifestyle tips to naturally help to improve pain. Discussed that these lifestyle strategies are also very good for health unlike some medications which can have negative side effects. Discussed that the act of keeping a journal can be therapeutic and helpful to realize patterns what helps to trigger and alleviate pain.     2) Anxiety: -referred to psychiatry -prescribed buspar  3) Insomnia: -Try to go outside near sunrise -Get exercise during the day.  -Turn off all devices an hour before bedtime.  -Teas that can benefit: chamomile, valerian root, Brahmi (Bacopa) -Can consider over the counter melatonin, magnesium, and/or L-theanine. Melatonin is an anti-oxidant with multiple health benefits. Magnesium is involved in greater than 300 enzymatic reactions in the body and most of [place] are deficient as our soil is often depleted. There are 7 different types of magnesium- Bioptemizer's is a supplement with all 7 types, and each has unique benefits.  Magnesium can also help with constipation and anxiety.  -Pistachios naturally increase the production of melatonin -[employer]zy Earth bamboo bed sheets are free from toxic chemicals.  -Tart cherry juice or a tart cherry supplement can improve sleep and soreness post-workout

## 2023-08-31 ENCOUNTER — Encounter: Payer: Self-pay | Admitting: Physical Medicine and Rehabilitation

## 2023-08-31 ENCOUNTER — Other Ambulatory Visit: Payer: Self-pay | Admitting: Physical Medicine and Rehabilitation

## 2023-08-31 MED ORDER — NORTRIPTYLINE HCL 25 MG PO CAPS: 25.0000 mg | ORAL_CAPSULE | Freq: Every day | ORAL | 3 refills | Status: DC

## 2023-09-01 DIAGNOSIS — G894 Chronic pain syndrome: Secondary | ICD-10-CM | POA: Diagnosis not present

## 2023-09-02 DIAGNOSIS — G894 Chronic pain syndrome: Secondary | ICD-10-CM | POA: Diagnosis not present

## 2023-09-07 DIAGNOSIS — Z89511 Acquired absence of right leg below knee: Secondary | ICD-10-CM | POA: Diagnosis not present

## 2023-09-11 DIAGNOSIS — T8789 Other complications of amputation stump: Secondary | ICD-10-CM | POA: Diagnosis not present

## 2023-09-11 DIAGNOSIS — M79609 Pain in unspecified limb: Secondary | ICD-10-CM | POA: Diagnosis not present

## 2023-09-11 DIAGNOSIS — X58XXXA Exposure to other specified factors, initial encounter: Secondary | ICD-10-CM | POA: Diagnosis not present

## 2023-09-22 DIAGNOSIS — Z899 Acquired absence of limb, unspecified: Secondary | ICD-10-CM | POA: Diagnosis not present

## 2023-09-22 DIAGNOSIS — M79604 Pain in right leg: Secondary | ICD-10-CM | POA: Diagnosis not present

## 2023-09-28 ENCOUNTER — Encounter: Payer: Self-pay | Admitting: Physical Medicine and Rehabilitation

## 2023-09-28 DIAGNOSIS — Z89511 Acquired absence of right leg below knee: Secondary | ICD-10-CM | POA: Diagnosis not present

## 2023-10-01 ENCOUNTER — Encounter: Payer: BC Managed Care – PPO | Admitting: Physical Medicine and Rehabilitation

## 2023-10-13 ENCOUNTER — Other Ambulatory Visit: Payer: Self-pay | Admitting: Physical Medicine and Rehabilitation

## 2023-10-13 ENCOUNTER — Encounter: Payer: Self-pay | Admitting: Physical Medicine and Rehabilitation

## 2023-10-13 DIAGNOSIS — G90521 Complex regional pain syndrome I of right lower limb: Secondary | ICD-10-CM | POA: Diagnosis not present

## 2023-10-13 DIAGNOSIS — Z89511 Acquired absence of right leg below knee: Secondary | ICD-10-CM | POA: Diagnosis not present

## 2023-10-13 MED ORDER — BACLOFEN 10 MG PO TABS: 10.0000 mg | ORAL_TABLET | Freq: Two times a day (BID) | ORAL | 3 refills | Status: AC | PRN

## 2023-10-13 MED ORDER — NORTRIPTYLINE HCL 25 MG PO CAPS: 25.0000 mg | ORAL_CAPSULE | Freq: Two times a day (BID) | ORAL | 3 refills | Status: DC

## 2023-10-14 DIAGNOSIS — Z89511 Acquired absence of right leg below knee: Secondary | ICD-10-CM | POA: Diagnosis not present

## 2023-10-16 DIAGNOSIS — E785 Hyperlipidemia, unspecified: Secondary | ICD-10-CM | POA: Diagnosis not present

## 2023-10-16 DIAGNOSIS — Z1389 Encounter for screening for other disorder: Secondary | ICD-10-CM | POA: Diagnosis not present

## 2023-10-23 DIAGNOSIS — Z23 Encounter for immunization: Secondary | ICD-10-CM | POA: Diagnosis not present

## 2023-10-23 DIAGNOSIS — M792 Neuralgia and neuritis, unspecified: Secondary | ICD-10-CM | POA: Diagnosis not present

## 2023-10-23 DIAGNOSIS — Z1339 Encounter for screening examination for other mental health and behavioral disorders: Secondary | ICD-10-CM | POA: Diagnosis not present

## 2023-10-23 DIAGNOSIS — Z0000 Encounter for general adult medical examination without abnormal findings: Secondary | ICD-10-CM | POA: Diagnosis not present

## 2023-10-23 DIAGNOSIS — Z1331 Encounter for screening for depression: Secondary | ICD-10-CM | POA: Diagnosis not present

## 2023-11-05 ENCOUNTER — Other Ambulatory Visit: Payer: Self-pay | Admitting: Neurosurgery

## 2023-11-05 ENCOUNTER — Encounter: Payer: Self-pay | Admitting: Neurosurgery

## 2023-11-05 DIAGNOSIS — G546 Phantom limb syndrome with pain: Secondary | ICD-10-CM | POA: Diagnosis not present

## 2023-11-05 DIAGNOSIS — M79609 Pain in unspecified limb: Secondary | ICD-10-CM | POA: Diagnosis not present

## 2023-11-05 DIAGNOSIS — T8789 Other complications of amputation stump: Secondary | ICD-10-CM

## 2023-11-05 DIAGNOSIS — Y828 Other medical devices associated with adverse incidents: Secondary | ICD-10-CM | POA: Diagnosis not present

## 2023-11-06 ENCOUNTER — Ambulatory Visit
Admission: RE | Admit: 2023-11-06 | Discharge: 2023-11-06 | Disposition: A | Discharge status: Home / Self Care | Payer: BC Managed Care – PPO | Source: Ambulatory Visit | Attending: Neurosurgery | Admitting: Neurosurgery

## 2023-11-06 ENCOUNTER — Other Ambulatory Visit: Payer: Self-pay | Admitting: Orthopedic Surgery

## 2023-11-06 DIAGNOSIS — Z89511 Acquired absence of right leg below knee: Secondary | ICD-10-CM

## 2023-11-06 DIAGNOSIS — M47814 Spondylosis without myelopathy or radiculopathy, thoracic region: Secondary | ICD-10-CM | POA: Diagnosis not present

## 2023-11-06 DIAGNOSIS — M79609 Pain in unspecified limb: Secondary | ICD-10-CM

## 2023-11-06 DIAGNOSIS — M47816 Spondylosis without myelopathy or radiculopathy, lumbar region: Secondary | ICD-10-CM | POA: Diagnosis not present

## 2023-11-12 ENCOUNTER — Other Ambulatory Visit: Payer: BC Managed Care – PPO

## 2023-11-16 ENCOUNTER — Other Ambulatory Visit: Payer: Self-pay | Admitting: Orthopedic Surgery

## 2023-11-16 ENCOUNTER — Ambulatory Visit
Admission: RE | Admit: 2023-11-16 | Discharge: 2023-11-16 | Disposition: A | Discharge status: Home / Self Care | Payer: BC Managed Care – PPO | Source: Ambulatory Visit | Attending: Orthopedic Surgery | Admitting: Orthopedic Surgery

## 2023-11-16 DIAGNOSIS — Z01818 Encounter for other preprocedural examination: Secondary | ICD-10-CM | POA: Diagnosis not present

## 2023-11-16 DIAGNOSIS — Z89511 Acquired absence of right leg below knee: Secondary | ICD-10-CM

## 2023-11-24 ENCOUNTER — Other Ambulatory Visit: Payer: BC Managed Care – PPO

## 2023-11-26 DIAGNOSIS — G90521 Complex regional pain syndrome I of right lower limb: Secondary | ICD-10-CM | POA: Diagnosis not present

## 2023-11-26 DIAGNOSIS — G546 Phantom limb syndrome with pain: Secondary | ICD-10-CM | POA: Diagnosis not present

## 2023-11-26 DIAGNOSIS — Z89511 Acquired absence of right leg below knee: Secondary | ICD-10-CM | POA: Diagnosis not present

## 2023-11-26 DIAGNOSIS — R269 Unspecified abnormalities of gait and mobility: Secondary | ICD-10-CM | POA: Diagnosis not present

## 2023-12-03 NOTE — Progress Notes (Signed)
Virtual Visit via Video Note  I connected with Ashley Brandt on 12/05/23 at  9:00 AM EST by a video enabled telemedicine application and verified that I am speaking with the correct person using two identifiers.  Location: Patient: home Provider: office Persons participated in the visit- patient, provider    I discussed the limitations of evaluation and management by telemedicine and the availability of in person appointments. The patient expressed understanding and agreed to proceed.  I discussed the assessment and treatment plan with the patient. The patient was provided an opportunity to ask questions and all were answered. The patient agreed with the plan and demonstrated an understanding of the instructions.   The patient was advised to call back or seek an in-person evaluation if the symptoms worsen or if the condition fails to improve as anticipated.  I provided 60 minutes of non-face-to-face time during this encounter.   Ashley Hotter, MD     Psychiatric Initial Adult Assessment   Patient Identification: Ashley Brandt MRN:  [id number] Date of Evaluation:  12/05/2023 Referral Source: Ashley Joe, MD  Chief Complaint:   Chief Complaint  Patient presents with   Establish Care   Visit Diagnosis:    ICD-10-CM   1. Adjustment disorder with anxious mood  F43.22     2. Insomnia, unspecified type  G47.00     3. High risk medication use  Z79.899 Urine Drug Panel 7      History of Present Illness:   Ashley Brandt is a 52 y.o. 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   She states that she had Morton's neuroma removed in Feb 2022.  She had nerve damage since then, and she had amputation.  She is now suffering from CRPS, and is hoping to get spinal cord stimulator.  She feels that she is dependent on her husband.  She wants to have her life back.  She has never had  any medical problems before.  And was very independent.  She raised her 3 kids herself as her husband was out of state most of the time.  She wishes to visit her son in [place].  She also wants to visit grandchildren if she has any in the future. She has a variety of emotions from excitement to feeling scared.  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  She feels somewhat concerned about the gap of several weeks between the trial and the actual procedure, as people often experience worsening pain during this period. However, she believes the procedure is necessary. She walks with crutches.  She cannot walk far.  It is hard to cook meals.  She describes herself as social introvert.  Although she enjoys drawing outside of work, having her own time, she also needs to be around people.  She reports good support from her husband, her friends.  Although she feels comfortable with her current connections, she also expresses a desire to expand her network and broaden her opportunities.  She shares an episode of her visiting [place] for her medical appointment.  She enjoyed hearing different languages at [employer].  She enjoyed meeting with her friend.  It is usually fulfilling to attend low-key party, although she had to decline to attend a party the other time. She loves her work, and it gives her sense of purpose.  Although she felt her mood was quite poor three years ago, it has improved significantly since then. She now feels a sense of hope and believes there is light at the end of the tunnel. She is aware of her perfectionistic tendencies and believes she has made peace with accepting her limitations, though she still has a strong desire to accomplish more.  Anxiety/Insomnia-she uses lorazepam, or cannabis for sleep.  She tends to feel anxious at night when she is not doing anything.  She does not think anxiety is consuming during the day, and has been able to  engage in activities as described above.  She is well aware of the risk of dependence, and she has been able to be off this medication for a few weeks.  She does not take opioid, and is trying to refrain from this as she is aware of risk of concomitant use of lorazepam.  She denies irritability.  She denies panic attacks.   Medication- nortriptyline 25 mg at night since may 2022 for pain. Higher dose caused dry mouth and sexual side effect,   Gabapentin 800 mg tid. Of note, she never took Buspar  Substance use  Tobacco Alcohol Other substances/  Current  Glass of wine, rarely drinks Cannabis every night for pain, no caffeine  Past  Social drink only   Past Treatment         Support: husband, friends Household: husband, 75 year old daughter Marital status: married Number of children: 65, 55 year old, and 7 yo twins Employment: Armed forces operational officer for several years Education:   She was born in [place], [place], and has a close relationship with her mother, who resides in [place]. She considers her mother to be the best example of a [place]. She describes her childhood as generally positive, though there were challenges related to religion. Growing up, her family attended a fundamentalist church, and she recalls feeling conflicted during that time.    Associated Signs/Symptoms: Depression Symptoms:   denies (Hypo) Manic Symptoms:   denies decreased need for sleep, euphoria Anxiety Symptoms:   situational anxiety about surgery Psychotic Symptoms:   denies AH, VH, paranoia PTSD Symptoms: Negative  Past Psychiatric History:  Outpatient: therapist only, Ashley Brandt in [place], [facility] Psychiatry admission: denies Previous suicide attempt: denies Past trials of medication: Trazodone caused visual changes. Mirtazapine caused increase in appetite History of violence:  History of head injury:   Previous Psychotropic Medications: Yes   Substance Abuse History in the last 12 months:   No.  Consequences of Substance Abuse: Negative  Past Medical History:  Past Medical History:  Diagnosis Date   Allergy     Past Surgical History:  Procedure Laterality Date   BREAST REDUCTION SURGERY  2006   CESAREAN SECTION     CESAREAN SECTION  2000    Family Psychiatric History: as below  Family History:  Family History  Problem Relation Age of Onset   Depression Mother    Diabetes Mellitus II Mother    Heart Problems Father     Social History:   Social History   Socioeconomic History   Marital status: Married    Spouse name: Not on file   Number of children: Not on file   Years of education: Not on file   Highest education level: Not on file  Occupational History   Not on file  Tobacco Use   Smoking status: Never   Smokeless tobacco: Never  Vaping Use   Vaping status: Never Used  Substance and Sexual Activity   Alcohol use: Yes    Comment: social    Drug use: No   Sexual activity: Yes    Birth control/protection: Abstinence, None  Other Topics Concern   Not on file  Social History Narrative   Not on file   Social Drivers of Health   Financial Resource Strain: Low Risk  (11/02/2023)   Received from [facility]   Overall Financial Resource Strain (CARDIA)    Difficulty of Paying Living Expenses: Not hard at all  Food Insecurity: No Food Insecurity (11/02/2023)   Received from [facility]   Hunger Vital Sign    Worried About Running Out of Food in the Last Year: Never true    Ran Out of Food in the Last Year: Never true  Transportation Needs: No Transportation Needs (11/02/2023)   Received from [facility] - Transportation    In the past 12 months, has lack of transportation kept you from medical appointments or from getting medications?: No    Lack of Transportation (Non-Medical): No  Physical Activity: Inactive (09/02/2021)   Received from [facility]   Exercise  Vital Sign    Days of Exercise per Week: 0 days    Minutes of Exercise per Session: 0 min  Stress: Stress Concern Present (09/02/2021)   Received from [facility] - Occupational Stress Questionnaire    Feeling of Stress : Very much  Social Connections: Unknown (04/23/2022)   Received from [facility]   Social Network    Social Network: Not on file    Additional Social History: as above  Allergies:   Allergies  Allergen Reactions   Dust Mite Mixed Allergen Ext [Mite (D. Farinae)]     Metabolic Disorder Labs: No results found for: "HGBA1C", "MPG" No results found for: "PROLACTIN" No results found for: "CHOL", "TRIG", "HDL", "CHOLHDL", "VLDL", "LDLCALC" No results found for: "TSH"  Therapeutic Level Labs: No results found for: "LITHIUM" No results found for: "CBMZ" No results found for: "VALPROATE"  Current Medications: Current Outpatient Medications  Medication Sig Dispense Refill   Ascorbic Acid (VITAMIN C) 1000 MG tablet Take 1,000 mg by mouth daily.     baclofen (LIORESAL) 10 MG tablet Take 1 tablet (10 mg total) by mouth 2 (two) times daily as needed for muscle spasms. 180 each 3   busPIRone (BUSPAR) 5 MG tablet Take 1 tablet (5 mg total) by mouth 3 (three) times daily as needed. 90 tablet 3   cholecalciferol (VITAMIN D3) 25 MCG (1000 UNIT) tablet Take 5,000 Units by mouth daily.     gabapentin (NEURONTIN) 600 MG tablet Take 600 mg by mouth 3 (three) times daily.     magnesium 30 MG tablet Take 30 mg by mouth 2 (two) times daily. 120mg      Multiple Vitamin (MULTIVITAMIN) tablet Take 1 tablet by mouth daily.     nortriptyline (PAMELOR) 25 MG capsule Take 1 capsule (25 mg total) by mouth 2 (two) times daily. 180 capsule 3   Omega-3 Fatty Acids (FISH OIL) 1000 MG CAPS Take 3,600 mg by mouth daily.     vitamin B-12 (CYANOCOBALAMIN) 1000 MCG tablet Take 1,000 mcg by mouth daily. 500 mcg     No current  facility-administered medications for this visit.    Musculoskeletal: Strength & Muscle Tone:  N/A Gait & Station:  N/A Patient leans: N/A  Psychiatric Specialty Exam: Review of Systems  Psychiatric/Behavioral:  Positive for sleep disturbance. Negative for agitation, behavioral problems, confusion, decreased concentration, dysphoric mood, hallucinations, self-injury and suicidal ideas. The patient is nervous/anxious. The patient is not hyperactive.   All other systems reviewed and are negative.   There were no vitals taken for this visit.There is no height or weight on file to calculate BMI.  General Appearance: Well Groomed  Eye Contact:  Good  Speech:  Clear and Coherent  Volume:  Normal  Mood:   good  Affect:  Appropriate, Congruent, and Full Range  Thought Process:  Coherent  Orientation:  Full (Time, Place, and Person)  Thought Content:  Logical  Suicidal Thoughts:  No  Homicidal Thoughts:  No  Memory:  Immediate;   Good  Judgement:  Good  Insight:  Good  Psychomotor Activity:  Normal  Concentration:  Concentration: Good and Attention Span: Good  Recall:  Good  Fund of Knowledge:Good  Language: Good  Akathisia:  No  Handed:  Right  AIMS (if indicated):  not done  Assets:  Communication Skills Desire for Improvement  ADL's:  Intact  Cognition: WNL  Sleep:  Poor   Screenings: PHQ2-9    Flowsheet Row Office Visit from 08/28/2023 in [facility] Office Visit from 06/11/2021 in [facility] Dept Of [facility] Office Visit from 01/20/2016 in Primary Care at [facility] Total Score 2 2 0  PHQ-9 Total Score -- 7 --      Flowsheet Row ED from 03/22/2023 in [facility] Emergency Department at [facility] ED from 06/17/2022 in [facility] at [facility] ED from 04/22/2021 in [facility] Emergency Department at [facility]  C-SSRS RISK CATEGORY No  Risk No Risk No Risk       Assessment and Plan:  ATYANA SUPINGER is a 52 y.o. 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   1. Adjustment disorder with anxious mood Acute stressors include: upcoming procedure for spinal cord stimulation  Other stressors include:     History: no prior mental health history prior to BKA   Exam is notable for euthymic affect.  Although she experiences anxiety,  it falls within the expected range, especially considering her previous surgical experience. She remains well-connected with her family, deeply enjoys her work, and has a strong desire to maintain independence.  While she is on nortriptyline, it is primarily for pain, and she had adverse reaction from higher dose.  She would not need additional antidepressant at this time given her mood symptoms are situational and lack of severity.  She will greatly benefit from ACT; she is willing to reconnect with her therapist.   2. Insomnia, unspecified type She experiences initial insomnia due to nighttime anxiety, and reports significant benefit from lorazepam, having had adverse reactions to other psychotropics. She is aware of the risks of tolerance and dependence associated with lorazepam and has demonstrated the ability to discontinue its use when not needed. She agrees that the medication will be prescribed only for short-term use to manage anxiety leading up to her procedure. The potential risk of respiratory suppression when used concurrently with gabapentin, as well as the risks of drowsiness, dependence, and tolerance, were thoroughly discussed.  3. High risk medication use  She agrees to obtain UDS prior to be prescribed lorazepam.   Plan Obtain UDS After reviewing UDS, plan to order lorazepam 1 mg daily as needed for insomnia, anxiety  Next appointment- 2/5 at 3 30 for 30 mins, video She will reconnect with  her therapist   The patient demonstrates the following risk factors for suicide: Chronic risk factors for suicide include: chronic pain. Acute risk factors for suicide include: loss (financial, interpersonal, professional). Protective factors for this patient include: positive social support, responsibility to others (children, family), coping skills, and hope for the future. Considering these factors, the overall suicide risk at this point appears to be low. Patient is appropriate for outpatient follow up.   Collaboration of Care: Other reviewed notes in Epic  Patient/Guardian was advised Release of Information must be obtained prior to any record release in order to collaborate their care with an outside provider. Patient/Guardian was advised if they have not already done so to contact the registration department to sign all necessary forms in order for [place] to release information regarding their care.   Consent: Patient/Guardian gives verbal consent for treatment and assignment of benefits for services provided during this visit. Patient/Guardian expressed understanding and agreed to proceed.   The duration of the time spent on the following activities on the date of the encounter was 60 minutes.   Preparing to see the patient (e.g., review of test, records)  Obtaining and/or reviewing separately obtained history  Performing a medically necessary exam and/or evaluation  Counseling and educating the patient/family/caregiver  Ordering medications, tests, or procedures  Referring and communicating with other healthcare professionals (when not reported separately)  Documenting clinical information in the electronic or paper health record  Independently interpreting results of tests/labs and communication of results to the family or caregiver  Care coordination (when not reported separately)   Ashley Hotter, MD 12/14/202412:29 PM

## 2023-12-05 ENCOUNTER — Encounter (HOSPITAL_COMMUNITY): Payer: Self-pay | Admitting: Psychiatry

## 2023-12-05 ENCOUNTER — Ambulatory Visit (HOSPITAL_BASED_OUTPATIENT_CLINIC_OR_DEPARTMENT_OTHER): Payer: Self-pay | Admitting: Psychiatry

## 2023-12-05 DIAGNOSIS — Z79899 Other long term (current) drug therapy: Secondary | ICD-10-CM

## 2023-12-05 DIAGNOSIS — F4322 Adjustment disorder with anxiety: Secondary | ICD-10-CM

## 2023-12-05 DIAGNOSIS — G4700 Insomnia, unspecified: Secondary | ICD-10-CM

## 2023-12-05 NOTE — Patient Instructions (Signed)
Obtain UDS After reviewing UDS, plan to order lorazepam 1 mg daily as needed for insomnia, anxiety  Next appointment- 2/5 at 3 30

## 2023-12-08 DIAGNOSIS — Z79899 Other long term (current) drug therapy: Secondary | ICD-10-CM | POA: Diagnosis not present

## 2023-12-14 LAB — URINE DRUG PANEL 7
Amphetamines, Urine: NEGATIVE ng/mL
Barbiturate Quant, Ur: NEGATIVE ng/mL
Benzodiazepine Quant, Ur: NEGATIVE ng/mL
Cannabinoid Quant, Ur: POSITIVE — AB
Cocaine (Metab.): NEGATIVE ng/mL
Opiate Quant, Ur: NEGATIVE ng/mL
PCP Quant, Ur: NEGATIVE ng/mL

## 2023-12-19 ENCOUNTER — Other Ambulatory Visit: Payer: Self-pay | Admitting: Psychiatry

## 2023-12-19 MED ORDER — LORAZEPAM 1 MG PO TABS: 1.0000 mg | ORAL_TABLET | Freq: Every day | ORAL | 1 refills | Status: DC | PRN

## 2023-12-19 NOTE — Progress Notes (Signed)
UDS was reviewed, and it was positive for Cannabinoid, which was to be expected. Please advise her that lorazepam has been ordered.

## 2023-12-21 ENCOUNTER — Telehealth: Payer: Self-pay

## 2023-12-21 NOTE — Progress Notes (Signed)
Called patient to make aware that of the UDS results and that the Lorazepam had been ordered patient voiced understanding

## 2023-12-21 NOTE — Telephone Encounter (Signed)
-----   Message from [place] sent at 12/19/2023  1:53 PM EST ----- UDS was reviewed, and it was positive for Cannabinoid, which was to be expected. Please advise her that lorazepam has been ordered.

## 2023-12-21 NOTE — Telephone Encounter (Signed)
left message with the labwork results and that rx had been sent to the pharmacy

## 2024-01-01 DIAGNOSIS — Z89512 Acquired absence of left leg below knee: Secondary | ICD-10-CM | POA: Diagnosis not present

## 2024-01-01 DIAGNOSIS — G90521 Complex regional pain syndrome I of right lower limb: Secondary | ICD-10-CM | POA: Diagnosis not present

## 2024-01-01 DIAGNOSIS — Z89511 Acquired absence of right leg below knee: Secondary | ICD-10-CM | POA: Diagnosis not present

## 2024-01-18 DIAGNOSIS — G546 Phantom limb syndrome with pain: Secondary | ICD-10-CM | POA: Diagnosis not present

## 2024-01-18 DIAGNOSIS — G90521 Complex regional pain syndrome I of right lower limb: Secondary | ICD-10-CM | POA: Diagnosis not present

## 2024-01-23 NOTE — Progress Notes (Signed)
 Virtual Visit via Video Note  I connected with Ashley Brandt on 01/27/24 at  3:30 PM EST by a video enabled telemedicine application and verified that I am speaking with the correct person using two identifiers.  Location: Patient: home Provider: office Persons participated in the visit- patient, provider    I discussed the limitations of evaluation and management by telemedicine and the availability of in person appointments. The patient expressed understanding and agreed to proceed.    I discussed the assessment and treatment plan with the patient. The patient was provided an opportunity to ask questions and all were answered. The patient agreed with the plan and demonstrated an understanding of the instructions.   The patient was advised to call back or seek an in-person evaluation if the symptoms worsen or if the condition fails to improve as anticipated.  Katheren Sleet, MD    [facility] MD/PA/NP OP Progress Note  01/27/2024 5:15 PM Ashley Brandt  MRN:  [id number]  Chief Complaint:  Chief Complaint  Patient presents with   Follow-up   HPI:  This is a follow-up appointment for adjustment disorder, insomnia.  She states that insurance denied spinal cord stimulation.  They are making appeal.  She is concerned that her pain secondary to CRPS jump to the other area of her body. She experiences mirror pain.  She is trying to do deep breathing.  She has been seeing a psychologist, occupational, and has been working on physicist, medical and grief.  She would like to try EMDR as she feels traumatized from medical procedure.  She feels angry as she thought it would be her minor surgery.  She enjoys whenever she is with her family or friends, and denies anhedonia.  She states that she is usually not a down person.  She loves to do things if she were not to have much pain.  She struggles with sleep without medication, and takes lorazepam .  She still has about 21 tabs left. She takes OTC sound asleep, and tries to  alternate medication to avoid developing tolerance. She denies drowsiness. She denies SI.  She states that she is hoping to be on the other side, hopefully after the procedure for spinal cord stimulation.  Substance use   Tobacco Alcohol  Other substances/  Current   Glass of wine, rarely drinks Cannabis every night for pain, no caffeine  Past   Social drink only    Past Treatment              Support: husband, friends Household: husband, 76 year old daughter Marital status: married Number of children: 38, 75 year old, and 87 yo twins Employment: armed forces operational officer for several years Education:   She was born in [place], [place] , and has a close relationship with her mother, who resides in [place]. She considers her mother to be the best example of a Christian. She describes her childhood as generally positive, though there were challenges related to religion. Growing up, her family attended a fundamentalist church, and she recalls feeling conflicted during that time.   Visit Diagnosis:    ICD-10-CM   1. Adjustment disorder with anxious mood  F43.22     2. Insomnia, unspecified type  G47.00       Past Psychiatric History: Please see initial evaluation for full details. I have reviewed the history. No updates at this time.     Past Medical History:  Past Medical History:  Diagnosis Date   Allergy     Past Surgical History:  Procedure Laterality Date   BREAST REDUCTION SURGERY  2006   CESAREAN SECTION     CESAREAN SECTION  2000    Family Psychiatric History: Please see initial evaluation for full details. I have reviewed the history. No updates at this time.     Family History:  Family History  Problem Relation Age of Onset   Depression Mother    Diabetes Mellitus II Mother    Heart Problems Father     Social History:  Social History   Socioeconomic History   Marital status: Married    Spouse name: Not on file   Number of children: Not on file   Years of  education: Not on file   Highest education level: Not on file  Occupational History   Not on file  Tobacco Use   Smoking status: Never   Smokeless tobacco: Never  Vaping Use   Vaping status: Never Used  Substance and Sexual Activity   Alcohol  use: Yes    Comment: social    Drug use: No   Sexual activity: Yes    Birth control/protection: Abstinence, None  Other Topics Concern   Not on file  Social History Narrative   Not on file   Social Drivers of Health   Financial Resource Strain: Low Risk  (11/02/2023)   Received from [facility]   Overall Financial Resource Strain (CARDIA)    Difficulty of Paying Living Expenses: Not hard at all  Food Insecurity: No Food Insecurity (11/02/2023)   Received from [facility]   Hunger Vital Sign    Worried About Running Out of Food in the Last Year: Never true    Ran Out of Food in the Last Year: Never true  Transportation Needs: No Transportation Needs (11/02/2023)   Received from [facility] - Transportation    In the past 12 months, has lack of transportation kept you from medical appointments or from getting medications?: No    Lack of Transportation (Non-Medical): No  Physical Activity: Inactive (09/02/2021)   Received from [facility]   Exercise Vital Sign    Days of Exercise per Week: 0 days    Minutes of Exercise per Session: 0 min  Stress: Stress Concern Present (09/02/2021)   Received from [facility] - Occupational Stress Questionnaire    Feeling of Stress : Very much  Social Connections: Unknown (04/23/2022)   Received from [facility]   Social Network    Social Network: Not on file    Allergies:  Allergies  Allergen Reactions   Dust Mite Mixed Allergen Ext [Mite (D. Farinae)]     Metabolic Disorder Labs: No results found for: HGBA1C, MPG No results found  for: PROLACTIN No results found for: CHOL, TRIG, HDL, CHOLHDL, VLDL, LDLCALC No results found for: TSH  Therapeutic Level Labs: No results found for: LITHIUM No results found for: VALPROATE No results found for: CBMZ  Current Medications: Current Outpatient Medications  Medication Sig Dispense Refill   Ascorbic Acid (VITAMIN C) 1000 MG tablet Take 1,000 mg by mouth daily.     baclofen  (LIORESAL ) 10 MG tablet Take 1 tablet (10 mg total) by mouth 2 (two) times daily as needed for muscle spasms. 180 each 3   busPIRone  (BUSPAR ) 5 MG tablet Take 1 tablet (5 mg total) by mouth 3 (three) times daily as needed. 90 tablet 3   cholecalciferol (  VITAMIN D3) 25 MCG (1000 UNIT) tablet Take 5,000 Units by mouth daily.     gabapentin  (NEURONTIN ) 600 MG tablet Take 600 mg by mouth 3 (three) times daily.     LORazepam  (ATIVAN ) 1 MG tablet Take 1 tablet (1 mg total) by mouth daily as needed for anxiety. 30 tablet 1   magnesium 30 MG tablet Take 30 mg by mouth 2 (two) times daily. 120mg      Multiple Vitamin (MULTIVITAMIN) tablet Take 1 tablet by mouth daily.     nortriptyline  (PAMELOR ) 25 MG capsule Take 1 capsule (25 mg total) by mouth 2 (two) times daily. 180 capsule 3   Omega-3 Fatty Acids (FISH OIL) 1000 MG CAPS Take 3,600 mg by mouth daily.     vitamin B-12 (CYANOCOBALAMIN) 1000 MCG tablet Take 1,000 mcg by mouth daily. 500 mcg     No current facility-administered medications for this visit.     Musculoskeletal: Strength & Muscle Tone:  n/a Gait & Station:  N/A Patient leans: N/A  Psychiatric Specialty Exam: Review of Systems  Psychiatric/Behavioral:  Positive for sleep disturbance. Negative for agitation, behavioral problems, confusion, decreased concentration, dysphoric mood, hallucinations, self-injury and suicidal ideas. The patient is nervous/anxious. The patient is not hyperactive.   All other systems reviewed and are negative.   There were no vitals taken for this  visit.There is no height or weight on file to calculate BMI.  General Appearance: Well Groomed  Eye Contact:  Good  Speech:  Clear and Coherent  Volume:  Normal  Mood:   fine  Affect:  Appropriate, Congruent, and slightly tense  Thought Process:  Coherent  Orientation:  Full (Time, Place, and Person)  Thought Content: Logical   Suicidal Thoughts:  No  Homicidal Thoughts:  No  Memory:  Immediate;   Good  Judgement:  Good  Insight:  Good  Psychomotor Activity:  Normal  Concentration:  Concentration: Good and Attention Span: Good  Recall:  Good  Fund of Knowledge: Good  Language: Good  Akathisia:  No  Handed:  Right  AIMS (if indicated): not done  Assets:  Communication Skills Desire for Improvement  ADL's:  Intact  Cognition: WNL  Sleep:  Fair   Screenings: PHQ2-9    Flowsheet Row Office Visit from 08/28/2023 in [facility] Office Visit from 06/11/2021 in [facility] Office Visit from 01/20/2016 in Primary Care at [facility] Total Score 2 2 0  PHQ-9 Total Score -- 7 --      Flowsheet Row ED from 03/22/2023 in [facility] Department at [facility] ED from 06/17/2022 in [facility] at [facility] ED from 04/22/2021 in [facility] Emergency Department at [facility]  C-SSRS RISK CATEGORY No Risk No Risk No Risk        Assessment and Plan:  Ashley Brandt is a 53 y.o. 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   1. Adjustment disorder with anxious mood Acute stressors include: upcoming procedure for spinal cord stimulation  Other stressors include:     History: no prior mental health history prior to BKA   She denies any significant concern about her mood except when she experiences pain.  She remains well-connected with her family, deeply  enjoys her work, and has a strong desire to maintain independence.  She remains on low dose of nortriptyline  mainly for pain.  Will not adjust any medication for now for her mood itself given these are situational, and lack of severity.  However, she expressed interest in trying psychotherapy, specifically EMDR, as she feels traumatized by the surgery, which resulted in an amputation. She had initially believed it would be a minor procedure.  We make a referral.   2. Insomnia, unspecified type - UDS positive for cannabinoid, 11/2023. On Sound Asleep, which contains L-Theanine 100mg , Full Spectrum Hemp Extract (from hemp extract aerial parts) 58mg , Cannabidiol (CBD) (hemp extract aerial parts) 50mg , THC (from hemp extract aerial parts) 5mg , Additional Minor Cannabinoids(from hemp extract aerial parts) 3mg , Melatonin 3mg  She experiences initial insomnia due to nighttime anxiety, and pain.  She reports significant benefit from lorazepam , and has been alternating with over-the-counter medication as outlined above.  She agrees that the medication will be prescribed only for short-term use to manage anxiety leading up to her procedure. The potential risk of respiratory suppression when used concurrently with gabapentin , as well as the risks of drowsiness, dependence, and tolerance, were thoroughly discussed. She is aware of the risks of tolerance and dependence associated with lorazepam  and has demonstrated the ability to discontinue its use when not needed.   Also provided psychoeducation regarding the concern/potential risk of long-term THC use.   Plan Continue lorazepam  1 mg daily as needed for insomnia, anxiety - refill left. She will contact the office if she needs another refill Next appointment- 4/2 at 4 30 for 30 mins, video Referral for therapy/EMDR - on gabapentin  800 mg TID, nortriptyline  25 mg twice a day   Past trials of medication: duloxetine , Trazodone visual changes. Mirtazapine increase in  appetite, Lyrica    The patient demonstrates the following risk factors for suicide: Chronic risk factors for suicide include: chronic pain. Acute risk factors for suicide include: loss (financial, interpersonal, professional). Protective factors for this patient include: positive social support, responsibility to others (children, family), coping skills, and hope for the future. Considering these factors, the overall suicide risk at this point appears to be low. Patient is appropriate for outpatient follow up.   A total of 30 minutes was spent on the following activities during the encounter date, which includes but is not limited to: preparing to see the patient (e.g., reviewing tests and records), obtaining and/or reviewing separately obtained history, performing a medically necessary examination or evaluation, counseling and educating the patient, family, or caregiver, ordering medications, tests, or procedures, referring and communicating with other healthcare professionals (when not reported separately), documenting clinical information in the electronic or paper health record, independently interpreting test or lab results and communicating these results to the family or caregiver, and coordinating care (when not reported separately).   Collaboration of Care: Collaboration of Care: Other reviewed notes in Epic  Patient/Guardian was advised Release of Information must be obtained prior to any record release in order to collaborate their care with an outside provider. Patient/Guardian was advised if they have not already done so to contact the registration department to sign all necessary forms in order for us  to release information regarding their care.   Consent: Patient/Guardian gives verbal consent for treatment and assignment of benefits for services provided during this visit. Patient/Guardian expressed understanding and agreed to proceed.    Katheren Sleet, MD 01/27/2024, 5:15 PM

## 2024-01-27 ENCOUNTER — Encounter: Payer: Self-pay | Admitting: Psychiatry

## 2024-01-27 ENCOUNTER — Telehealth (INDEPENDENT_AMBULATORY_CARE_PROVIDER_SITE_OTHER): Payer: BC Managed Care – PPO | Admitting: Psychiatry

## 2024-01-27 DIAGNOSIS — G4700 Insomnia, unspecified: Secondary | ICD-10-CM | POA: Diagnosis not present

## 2024-01-27 DIAGNOSIS — F4322 Adjustment disorder with anxiety: Secondary | ICD-10-CM

## 2024-01-27 NOTE — Patient Instructions (Signed)
Continue lorazepam 1 mg daily as needed for insomnia, anxiety Next appointment- 4/2 at 4 30

## 2024-02-18 DIAGNOSIS — F418 Other specified anxiety disorders: Secondary | ICD-10-CM | POA: Diagnosis not present

## 2024-02-18 DIAGNOSIS — G90521 Complex regional pain syndrome I of right lower limb: Secondary | ICD-10-CM | POA: Diagnosis not present

## 2024-02-18 DIAGNOSIS — E785 Hyperlipidemia, unspecified: Secondary | ICD-10-CM | POA: Diagnosis not present

## 2024-02-24 DIAGNOSIS — G90521 Complex regional pain syndrome I of right lower limb: Secondary | ICD-10-CM | POA: Diagnosis not present

## 2024-02-24 DIAGNOSIS — G546 Phantom limb syndrome with pain: Secondary | ICD-10-CM | POA: Diagnosis not present

## 2024-02-24 DIAGNOSIS — Z09 Encounter for follow-up examination after completed treatment for conditions other than malignant neoplasm: Secondary | ICD-10-CM | POA: Diagnosis not present

## 2024-02-26 DIAGNOSIS — G90521 Complex regional pain syndrome I of right lower limb: Secondary | ICD-10-CM | POA: Diagnosis not present

## 2024-02-26 DIAGNOSIS — G546 Phantom limb syndrome with pain: Secondary | ICD-10-CM | POA: Diagnosis not present

## 2024-02-26 DIAGNOSIS — Z09 Encounter for follow-up examination after completed treatment for conditions other than malignant neoplasm: Secondary | ICD-10-CM | POA: Diagnosis not present

## 2024-03-09 ENCOUNTER — Ambulatory Visit (INDEPENDENT_AMBULATORY_CARE_PROVIDER_SITE_OTHER): Payer: BC Managed Care – PPO | Admitting: Licensed Clinical Social Worker

## 2024-03-09 DIAGNOSIS — F4322 Adjustment disorder with anxiety: Secondary | ICD-10-CM | POA: Diagnosis not present

## 2024-03-09 DIAGNOSIS — F320 Major depressive disorder, single episode, mild: Secondary | ICD-10-CM

## 2024-03-09 NOTE — Progress Notes (Signed)
 Comprehensive Clinical Assessment (CCA) Note  Virtual Visit via Video Note  I connected with Ashley Brandt on 03/09/24 at  1:00 PM EDT by a video enabled telemedicine application and verified that I am speaking with the correct person using two identifiers.  Location: Patient: Address on file  Provider: [facility]   I discussed the limitations of evaluation and management by telemedicine and the availability of in person appointments. The patient expressed understanding and agreed to proceed.  History of Present Illness:    Observations/Objective:   Assessment and Plan:   Follow Up Instructions:    I discussed the assessment and treatment plan with the patient. The patient was provided an opportunity to ask questions and all were answered. The patient agreed with the plan and demonstrated an understanding of the instructions.   The patient was advised to call back or seek an in-person evaluation if the symptoms worsen or if the condition fails to improve as anticipated.  I provided 53 minutes of non-face-to-face time during this encounter.   Dereck Leep, LCSW   03/09/2024 Ashley Brandt [id number]  Chief Complaint:  Chief Complaint  Patient presents with   Establish Care   Depression   Anxiety   Visit Diagnosis: Adjustment disorder with anxious mood  Current mild episode of major depressive disorder, unspecified whether recurrent (HCC)  The patient reports experiencing functional impairments related to various areas, including difficulties with memory, concentration, and problem-solving; challenges in interpreting social cues and maintaining positive relationships within the family or in group work; a lack of engagement in hobbies or enjoyable activities; and difficulties in regulating mood and affect.     CCA Biopsychosocial Intake/Chief Complaint:  Ashley Brandt is a 53 y.o. year old female who presents alone virtually to establish care with  therapist at [facility]. Patient was referred by her psychiatrist, Dr. Vanetta Shawl. Presents with a history of anxiety.  Current Symptoms/Problems: No data recorded  Patient Reported Schizophrenia/Schizoaffective Diagnosis in Past: No data recorded  Strengths: No data recorded Preferences: Virtual Sessions  Abilities: No data recorded  Type of Services Patient Feels are Needed: Individual Therapeutic Therapy   Initial Clinical Notes/Concerns: No data recorded  Mental Health Symptoms Depression:  Change in energy/activity; Difficulty Concentrating; Fatigue; Hopelessness; Increase/decrease in appetite; Irritability; Tearfulness; Worthlessness   Duration of Depressive symptoms: Greater than two weeks   Mania:  None   Anxiety:   Difficulty concentrating; Fatigue; Irritability; Restlessness; Sleep; Tension; Worrying   Psychosis:  None   Duration of Psychotic symptoms: No data recorded  Trauma:  Irritability/anger; Detachment from others; Emotional numbing; Guilt/shame   Obsessions:  None   Compulsions:  None   Inattention:  None   Hyperactivity/Impulsivity:  None   Oppositional/Defiant Behaviors:  None   Emotional Irregularity:  Mood lability; Unstable self-image   Other Mood/Personality Symptoms:  No data recorded   Mental Status Exam Appearance and self-care  Stature:  Average   Weight:  Average weight   Clothing:  Age-appropriate   Grooming:  Well-groomed   Cosmetic use:  Age appropriate   Posture/gait:  Normal   Motor activity:  Not Remarkable   Sensorium  Attention:  Normal   Concentration:  Normal   Orientation:  X5   Recall/memory:  Normal   Affect and Mood  Affect:  Congruent   Mood:  Euthymic   Relating  Eye contact:  Normal   Facial expression:  Responsive   Attitude toward examiner:  Cooperative   Thought and Language  Speech flow:  Clear and Coherent   Thought content:  Appropriate to Mood and Circumstances   Preoccupation:  None    Hallucinations:  None   Organization:  No data recorded  [employer] of Knowledge:  Good   Intelligence:  Average   Abstraction:  Normal   Judgement:  Good   Reality Testing:  Adequate; Realistic   Insight:  Good   Decision Making:  Normal   Social Functioning  Social Maturity:  Responsible; Isolates   Social Judgement:  Normal   Stress  Stressors:  Grief/losses; Transitions (Trauma hx and physical pain)   Coping Ability:  Overwhelmed   Skill Deficits:  Activities of daily living; Self-care; Interpersonal   Supports:  Friends/Service system; Family     Religion:    Leisure/Recreation:    Exercise/Diet: Exercise/Diet Do You Have Any Trouble Sleeping?: Yes   CCA Employment/Education Employment/Work Situation: Employment / Work Situation Employment Situation: Employed 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 Has Patient ever Been in the 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 Does patient have children?: Yes How many children?: 3 How is patient's relationship with  their children?: 35 year old, and 29 yo twins; Reports she is close with her children.  Childhood History:  Childhood History By whom was/is the patient raised?: Both parents Additional childhood history information: Pt shares she has a close relationship with her mother despite her living in [place]. Per previous documentation with her psychiatrist, "She describes her childhood as generally positive, though there were challenges related to religion. Growing up, her family attended a fundamentalist church, and she recalls feeling conflicted during that time." Description of patient's relationship with caregiver when they were a child: Pt reports growing up her relationship with her mother was "good." witnessed her mother experience health issues and depression which she attributes as a cause to her health anxiety. Patient's description of current relationship with people who raised him/her: Reports she is close to her mother and tries to not let religious beliefs interfere with their relationship. Does patient have siblings?: Yes Number of Siblings: 1 Description of patient's current relationship with siblings: brother; "we are very close." Did patient suffer any verbal/emotional/physical/sexual abuse as a child?: No Did patient suffer from severe childhood neglect?: No Has patient ever been sexually abused/assaulted/raped as an adolescent or adult?: No Was the patient ever a victim of a crime or a disaster?: No Witnessed domestic violence?: No Has patient been affected by domestic violence as an adult?: No  Child/Adolescent Assessment:     CCA Substance Use Alcohol/Drug Use: Alcohol / Drug Use Pain Medications: See MAR Prescriptions: See MAR Over the Counter: See MAR History of alcohol / drug use?: No history  of alcohol / drug abuse (Uses Delta 8 to help cope with her pain.)                         ASAM's:  Six Dimensions of Multidimensional Assessment  Dimension 1:   Acute Intoxication and/or Withdrawal Potential:      Dimension 2:  Biomedical Conditions and Complications:      Dimension 3:  Emotional, Behavioral, or Cognitive Conditions and Complications:     Dimension 4:  Readiness to Change:     Dimension 5:  Relapse, Continued use, or Continued Problem Potential:     Dimension 6:  Recovery/Living Environment:     ASAM Severity Score:    ASAM Recommended Level of Treatment:     Substance use Disorder (SUD)    Recommendations for Services/Supports/Treatments: Recommendations for Services/Supports/Treatments Recommendations For Services/Supports/Treatments: Individual Therapy  DSM5 Diagnoses: Patient Active Problem List   Diagnosis Date Noted   Complex regional pain syndrome type 2 of right lower extremity 06/11/2021   Seasonal and perennial allergic rhinitis 02/14/2020   Food intolerance 02/14/2020   Ashley Brandt is a 53 y.o. year old female who presents alone virtually to establish care with therapist at [facility]. Patient was referred by her psychiatrist, Dr. Vanetta Shawl. Presents with a history of anxiety. 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 Reports her anxious sxs are initiated by chronic pain.  Patient is also learning how to navigate life post-surgery with pain and grief, which a coach has aided in supporting her through this process.  Patient reports she struggles to stay asleep. Denies AVH, HI, and SI.   Support system:  Shares she is supported by her friends and family. Pt shares she has a close relationship with her mother despite her living in [place]. Per previous documentation with her psychiatrist, "She describes her childhood as generally positive, though there were challenges related to  religion. Growing up, her family attended a fundamentalist church, and she recalls feeling conflicted during that time."   Stressors:  Reports she has a Surgery coming up on Friday-spinal cord simulator as she experiences chronic pain-cannot walk without crutches and her Prosthetic is not comfortable. Anger and grief towards the life she thought she would live and her physical capabilities.  Anxiety about her health since she feels she can no longer exercise.  Goal: Wants to move to [place]. Want to get involved with a disabled supportive community.   Therapeutic Goals:  "Address childhood trauma centered around religion."  Learn how to set boundaries with her mother around religious beliefs "Address PTSD related to all my medical stuff."   Patient Centered Plan: Patient is on the following Treatment Plan(s):  Anxiety and Post Traumatic Stress Disorder   Referrals to Alternative Service(s): Referred to Alternative Service(s):   Place:   Date:   Time:    Referred to Alternative Service(s):   Place:   Date:   Time:    Referred to Alternative Service(s):   Place:   Date:   Time:    Referred to Alternative Service(s):   Place:   Date:   Time:      Collaboration of Care: AEB psychiatrist can access notes and cln. Will review psychiatrists' notes. Check  in with the patient and will see LCSW per availability. Patient agreed with treatment recommendations.   Patient/Guardian was advised Release of Information must be obtained prior to any record release in order to collaborate their care with an outside provider. Patient/Guardian was advised if they have not already done so to contact the registration department to sign all necessary forms in order for [place] to release information regarding their care.   Consent: Patient/Guardian gives verbal consent for treatment and assignment of benefits for services provided during this visit. Patient/Guardian expressed understanding and agreed to proceed.    Dereck Leep, LCSW

## 2024-03-11 DIAGNOSIS — F418 Other specified anxiety disorders: Secondary | ICD-10-CM | POA: Diagnosis not present

## 2024-03-11 DIAGNOSIS — E785 Hyperlipidemia, unspecified: Secondary | ICD-10-CM | POA: Diagnosis not present

## 2024-03-11 DIAGNOSIS — G90521 Complex regional pain syndrome I of right lower limb: Secondary | ICD-10-CM | POA: Diagnosis not present

## 2024-03-11 DIAGNOSIS — Z79899 Other long term (current) drug therapy: Secondary | ICD-10-CM | POA: Diagnosis not present

## 2024-03-19 NOTE — Progress Notes (Unsigned)
 Virtual Visit via Video Note  I connected with Ashley Brandt on 03/23/24 at  4:30 PM EDT by a video enabled telemedicine application and verified that I am speaking with the correct person using two identifiers.  Location: Patient: home Provider: office Persons participated in the visit- patient, provider    I discussed the limitations of evaluation and management by telemedicine and the availability of in person appointments. The patient expressed understanding and agreed to proceed.    I discussed the assessment and treatment plan with the patient. The patient was provided an opportunity to ask questions and all were answered. The patient agreed with the plan and demonstrated an understanding of the instructions.   The patient was advised to call back or seek an in-person evaluation if the symptoms worsen or if the condition fails to improve as anticipated.    [name], MD    [facility] MD/PA/NP OP Progress Note  03/23/2024 5:43 PM HELEM REESOR  MRN:  [id number]  Chief Complaint:  Chief Complaint  Patient presents with   Follow-up   HPI:   -since the last visit, she underwent PERCUTANEOUS IMPLANTATION OF NEUROSTIMULATOR ELECTRODE ARRAY; EPIDURAL (N/A) INSERTION OR REPLACEMENT SPINAL NEUROSTIMULATOR PULSE GENERATOR OR RECEIVER, DIRECT OR INDUCTIVE COUPLING (N/A).   This is a follow-up appointment for adjustment disorder with anxiety and insomnia.  She states that she had spinal cord stimulator.  Although the surgery pain is not bad, CRP as pain is bad as ever.  She was informed by her provider that it can take up to several weeks to see the benefit.  It has been difficult as there is movement restriction.  Although she used to do push up and stretch, she is unable to do this.  She has been trying to be proactive.  She is hoping to reach out to the coach, who underwent spinal stimulator herself.  She experiences back and hip pain as she needs to let her leg elevated to avoid  edema.    While she reports good connection with her children, she feels sad that she is unable to go to see her sons game.  She also talks about an episode of her looking at the cock at 9 pm while she was seeing her friends.  She thinks it has impacted her life, and altered her personality.  She missed her old life, carefree. She was always laughing in the past..However, she thinks surgery part is better than expected.  She reports good support from her husband.  She saw a group of people who has CRPS.  She is trying to make it better, and to learn acceptance as she does not want to stay stuck here.  She experiences pain in the unaffected foot at night.  Although she can usually distract herself during the day, it is difficult without having stimulation.  Lorazepam helps this significantly.  She has good appetite, and eats healthy diet.  She states that she has health anxiety, and was too hypervigilant prior to this, referring to her father, who died at age 82 and her mother with diabetes.  She denies SI.    Substance use   Tobacco Alcohol Other substances/  Current   Glass of wine, rarely drinks Delta eight-9 gummies sporadic use  Cannabis every night for pain, no caffeine  Past   Social drink only    Past Treatment              Support: husband, friends Household: husband, 37 year old  daughter Marital status: married Number of children: 73, 25 year old, and 64 yo twins Employment: Armed forces operational officer for several years Education:   She was born in [place], [place], and has a close relationship with her mother, who resides in [place]. She considers her mother to be the best example of a [place]. She describes her childhood as generally positive, though there were challenges related to religion. Growing up, her family attended a fundamentalist church, and she recalls feeling conflicted during that time.    Visit Diagnosis:    ICD-10-CM   1. Adjustment disorder with anxious mood   F43.22     2. Insomnia, unspecified type  G47.00       Past Psychiatric History: Please see initial evaluation for full details. I have reviewed the history. No updates at this time.     Past Medical History:  Past Medical History:  Diagnosis Date   Allergy     Past Surgical History:  Procedure Laterality Date   BREAST REDUCTION SURGERY  2006   CESAREAN SECTION     CESAREAN SECTION  2000    Family Psychiatric History: Please see initial evaluation for full details. I have reviewed the history. No updates at this time.     Family History:  Family History  Problem Relation Age of Onset   Depression Mother    Diabetes Mellitus II Mother    Heart Problems Father     Social History:  Social History   Socioeconomic History   Marital status: Married    Spouse name: Not on file   Number of children: Not on file   Years of education: Not on file   Highest education level: Not on file  Occupational History   Not on file  Tobacco Use   Smoking status: Never   Smokeless tobacco: Never  Vaping Use   Vaping status: Never Used  Substance and Sexual Activity   Alcohol use: Yes    Comment: social    Drug use: No   Sexual activity: Yes    Birth control/protection: Abstinence, None  Other Topics Concern   Not on file  Social History Narrative   Not on file   Social Drivers of Health   Financial Resource Strain: Low Risk  (11/02/2023)   Received from [facility]   Overall Financial Resource Strain (CARDIA)    Difficulty of Paying Living Expenses: Not hard at all  Food Insecurity: No Food Insecurity (11/02/2023)   Received from [facility]   Hunger Vital Sign    Worried About Running Out of Food in the Last Year: Never true    Ran Out of Food in the Last Year: Never true  Transportation Needs: No Transportation Needs (11/02/2023)   Received from [facility] - Transportation    In the past 12 months,  has lack of transportation kept you from medical appointments or from getting medications?: No    Lack of Transportation (Non-Medical): No  Physical Activity: Inactive (09/02/2021)   Received from [facility]   Exercise Vital Sign    Days of Exercise per Week: 0 days    Minutes of Exercise per Session: 0 min  Stress: Stress Concern Present (09/02/2021)   Received from [facility] - Occupational Stress Questionnaire    Feeling of Stress : Very much  Social Connections: Unknown (04/23/2022)   [facility]  Social Network    Social Network: Not on file    Allergies:  Allergies  Allergen Reactions   Dust Mite Mixed Allergen Ext [Mite (D. Farinae)]     Metabolic Disorder Labs: No results found for: "HGBA1C", "MPG" No results found for: "PROLACTIN" No results found for: "CHOL", "TRIG", "HDL", "CHOLHDL", "VLDL", "LDLCALC" No results found for: "TSH"  Therapeutic Level Labs: No results found for: "LITHIUM" No results found for: "VALPROATE" No results found for: "CBMZ"  Current Medications: Current Outpatient Medications  Medication Sig Dispense Refill   Ascorbic Acid (VITAMIN C) 1000 MG tablet Take 1,000 mg by mouth daily.     baclofen (LIORESAL) 10 MG tablet Take 1 tablet (10 mg total) by mouth 2 (two) times daily as needed for muscle spasms. 180 each 3   busPIRone (BUSPAR) 5 MG tablet Take 1 tablet (5 mg total) by mouth 3 (three) times daily as needed. 90 tablet 3   cholecalciferol (VITAMIN D3) 25 MCG (1000 UNIT) tablet Take 5,000 Units by mouth daily.     gabapentin (NEURONTIN) 600 MG tablet Take 600 mg by mouth 3 (three) times daily.     LORazepam (ATIVAN) 1 MG tablet Take 1 tablet (1 mg total) by mouth daily as needed for anxiety. 30 tablet 0   magnesium 30 MG tablet Take 30 mg by mouth 2 (two) times daily. 120mg      Multiple Vitamin (MULTIVITAMIN) tablet Take 1 tablet by  mouth daily.     nortriptyline (PAMELOR) 25 MG capsule Take 1 capsule (25 mg total) by mouth 2 (two) times daily. 180 capsule 3   Omega-3 Fatty Acids (FISH OIL) 1000 MG CAPS Take 3,600 mg by mouth daily.     vitamin B-12 (CYANOCOBALAMIN) 1000 MCG tablet Take 1,000 mcg by mouth daily. 500 mcg     No current facility-administered medications for this visit.     Musculoskeletal: Strength & Muscle Tone:  N/A Gait & Station:  N/A Patient leans: N/A  Psychiatric Specialty Exam: Review of Systems  Psychiatric/Behavioral:  Positive for sleep disturbance. Negative for agitation, behavioral problems, confusion, decreased concentration, dysphoric mood, hallucinations, self-injury and suicidal ideas. The patient is nervous/anxious. The patient is not hyperactive.   All other systems reviewed and are negative.   There were no vitals taken for this visit.There is no height or weight on file to calculate BMI.  General Appearance: Well Groomed  Eye Contact:  Good  Speech:  Clear and Coherent  Volume:  Normal  Mood:   okay  Affect:  Appropriate, Congruent, Full Range, and Tearful  Thought Process:  Coherent  Orientation:  Full (Time, Place, and Person)  Thought Content: Logical   Suicidal Thoughts:  No  Homicidal Thoughts:  No  Memory:  Immediate;   Good  Judgement:  Good  Insight:  Good  Psychomotor Activity:  Normal  Concentration:  Concentration: Good and Attention Span: Good  Recall:  Good  Fund of Knowledge: Good  Language: Good  Akathisia:  No  Handed:  Right  AIMS (if indicated): not done  Assets:  Communication Skills Desire for Improvement  ADL's:  Intact  Cognition: WNL  Sleep:  Fair   Screenings: GAD-7    Advertising copywriter from 03/09/2024 in [facility]  Total GAD-7 Score 11      PHQ2-9    Flowsheet Row Counselor from 03/09/2024 in [facility] Office Visit from 08/28/2023 in [facility] Visit  from 06/11/2021 in [facility] Office Visit from 01/20/2016 in [facility] at [facility] Total Score 6 2 2  0  PHQ-9 Total Score 9 -- 7 --      Flowsheet Row Counselor from 03/09/2024 in [facility] ED from 03/22/2023 in [facility] ED from 06/17/2022 in [facility] at [facility] RISK CATEGORY No Risk No Risk No Risk        Assessment and Plan:  Ashley Brandt is a 53 y.o. 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   1. Adjustment disorder with anxious mood Acute stressors include: upcoming procedure for spinal cord stimulation  Other stressors include:     History: no prior mental health history prior to BKA. experiencing health anxiety, influenced by her father's death in his 49s and her mother's history of type II diabetes. She reports anxiety, mainly related to pain at night, while it is more manageable during the day. She remains engaged with her family, including her husband, and is working toward acceptance as a means to move forward from her current situation. She is not interested in adjustment of her medication for her mood  and prefers to wait and assess the effectiveness of the spinal cord stimulator. This plan will be honored.  In the future, venlafaxine, or prazosin if applicable will be considered. She will continue to see Ms. Perkins for therapy.   2. Insomnia, unspecified type - UDS positive for cannabinoid, 11/2023. On Sound Asleep, which contains L-Theanine 100mg , Full Spectrum Hemp Extract (from hemp extract aerial parts) 58mg , Cannabidiol (CBD) (hemp extract aerial parts) 50mg , THC (from hemp extract aerial parts) 5mg , Additional Minor  Cannabinoids(from hemp extract aerial parts) 3mg , Melatonin 3mg   She continues to experience anxiety at night related to worsening pain in the unaffected leg, which often improves after taking lorazepam.  Will continue current dose of clonazepam as needed for anxiety/insomnia.  She is well aware of the potential long-term risk of dependence, tolerance, and risk of respiratory suppression with concomitant use of opioid, gabapentin.  She has demonstrated the ability to discontinue its use when not needed.    # delta 8/9 use  Although she has refrained from its use, she expressed a desire to resume it in order to avoid relying on lorazepam. We discussed concerns regarding long-term THC use, and she expressed understanding.  Will continue motivational interview.    Plan Continue lorazepam 1 mg daily as needed for insomnia, anxiety  Next appointment- 5/21 at 4 pm, video Referral for therapy/EMDR - on gabapentin 800 mg TID, nortriptyline 25 mg twice a day   Past trials of medication: duloxetine, Trazodone "visual changes. Mirtazapine "increase in appetite, Lyrica   The patient demonstrates the following risk factors for suicide: Chronic risk factors for suicide include: chronic pain. Acute risk factors for suicide include: loss (financial, interpersonal, professional). Protective factors for this patient include: positive social support, responsibility to others (children, family), coping skills, and hope for the future. Considering these factors, the overall suicide risk at this point appears to be low. Patient is appropriate for outpatient follow up.   A total of 39 minutes was spent on the following activities during the encounter date, which includes but is not limited to: preparing to see the patient (e.g., reviewing  tests and records), obtaining and/or reviewing separately obtained history, performing a medically necessary examination or evaluation, counseling and educating the patient, family, or  caregiver, ordering medications, tests, or procedures, referring and communicating with other healthcare professionals (when not reported separately), documenting clinical information in the electronic or paper health record, independently interpreting test or lab results and communicating these results to the family or caregiver, and coordinating care (when not reported separately).   Collaboration of Care: Collaboration of Care: Other reviewed notes in Epic  Patient/Guardian was advised Release of Information must be obtained prior to any record release in order to collaborate their care with an outside provider. Patient/Guardian was advised if they have not already done so to contact the registration department to sign all necessary forms in order for [place] to release information regarding their care.   Consent: Patient/Guardian gives verbal consent for treatment and assignment of benefits for services provided during this visit. Patient/Guardian expressed understanding and agreed to proceed.    [name], MD 03/23/2024, 5:43 PM

## 2024-03-21 ENCOUNTER — Other Ambulatory Visit: Payer: Self-pay | Admitting: Psychiatry

## 2024-03-23 ENCOUNTER — Encounter: Payer: Self-pay | Admitting: Psychiatry

## 2024-03-23 ENCOUNTER — Telehealth (INDEPENDENT_AMBULATORY_CARE_PROVIDER_SITE_OTHER): Payer: BC Managed Care – PPO | Admitting: Psychiatry

## 2024-03-23 DIAGNOSIS — F4322 Adjustment disorder with anxiety: Secondary | ICD-10-CM

## 2024-03-23 DIAGNOSIS — G4700 Insomnia, unspecified: Secondary | ICD-10-CM

## 2024-03-25 DIAGNOSIS — Z4542 Encounter for adjustment and management of neuropacemaker (brain) (peripheral nerve) (spinal cord): Secondary | ICD-10-CM | POA: Diagnosis not present

## 2024-03-25 DIAGNOSIS — Z9689 Presence of other specified functional implants: Secondary | ICD-10-CM | POA: Diagnosis not present

## 2024-03-25 DIAGNOSIS — G546 Phantom limb syndrome with pain: Secondary | ICD-10-CM | POA: Diagnosis not present

## 2024-03-28 ENCOUNTER — Encounter: Payer: Self-pay | Admitting: Physical Medicine and Rehabilitation

## 2024-04-01 ENCOUNTER — Encounter: Attending: Physical Medicine and Rehabilitation | Admitting: Physical Medicine and Rehabilitation

## 2024-04-01 DIAGNOSIS — F4310 Post-traumatic stress disorder, unspecified: Secondary | ICD-10-CM

## 2024-04-01 DIAGNOSIS — F411 Generalized anxiety disorder: Secondary | ICD-10-CM

## 2024-04-01 DIAGNOSIS — G5771 Causalgia of right lower limb: Secondary | ICD-10-CM | POA: Diagnosis not present

## 2024-04-01 DIAGNOSIS — G4700 Insomnia, unspecified: Secondary | ICD-10-CM

## 2024-04-01 DIAGNOSIS — G4701 Insomnia due to medical condition: Secondary | ICD-10-CM

## 2024-04-01 DIAGNOSIS — F419 Anxiety disorder, unspecified: Secondary | ICD-10-CM | POA: Diagnosis not present

## 2024-04-01 MED ORDER — NORTRIPTYLINE HCL 25 MG PO CAPS: 25.0000 mg | ORAL_CAPSULE | Freq: Two times a day (BID) | ORAL | 3 refills | Status: DC

## 2024-04-01 MED ORDER — GABAPENTIN 600 MG PO TABS: 600.0000 mg | ORAL_TABLET | Freq: Three times a day (TID) | ORAL | 3 refills | Status: DC

## 2024-04-01 MED ORDER — GABAPENTIN 800 MG PO TABS: 800.0000 mg | ORAL_TABLET | Freq: Three times a day (TID) | ORAL | 3 refills | Status: DC

## 2024-04-01 NOTE — Progress Notes (Signed)
 Subjective:    Patient ID: Ashley Brandt, female    DOB: [birth year], 53 y.o.   MRN: [id number]  HPI An audio/video tele-health visit is felt to be the most appropriate encounter for this patient at this time. This is a follow up tele-visit via myChart Video. The patient is at home. MD is at office. Prior to scheduling this appointment, our staff discussed the limitations of evaluation and management by telemedicine and the availability of in-person appointments. The patient expressed understanding and agreed to proceed.   CC: RIght foot /toe pain  70) CRPS 53 year old female referred by her primary care physician for the evaluation of right foot and toe pain.  The patient states that she has 2 types of pain 1 that is on the bottom of the foot right around the area of the surgery the other area is more in the middle of the foot.  She really has few symptoms at the ankle or above.  She notes mild swelling in the right foot sometimes discoloration as well.  It is very hypersensitive to touch particularly on the plantar surface. 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  She was started on Lyrica and had some side effects and did not wish to continue taking it.  The patient also had a behavioral health evaluation 04/02/2021 diagnosed with insomnia and adjustment disorder with mixed anxiety depressed mood.  She tried Ambien and melatonin.  She has been on Cymbalta 30 mg up to 60 mg a day.  She is now on gabapentin 600 mg 3 times per day.  The patient has had a second opinion with orthopedic surgeon.  The patient has had an arterial Doppler on 04/23/2021 which was normal.  She is scheduling surgery at [facility] with a peripheral neurosurgeon with plans for resection of neuroma as well as wrapping the end of the neuroma with abdominal epidermis to prevent reforming  -she has always been active and started to run a lot more  during the pandemic. 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 She has had more pain and less function.   -she wanted a neurosurgeon to place her spinal cord stimulator  CRPS has gotten considerably worse, she is currently 3 weeks post-op spinal cord stimulator placement, she does find it helps, but knows it will take time to get used it   -she currently feels a burning pain in her residual limb  -she had muscle reintigration  -sometimes she feels a burning pain in her foot as well  Pain interferes with sleep at night  -prior to amputation she has tried bisphosphonate infusions, lumbar sympathetic nerve block, gabapentin, nortriptyline     CLINICAL DATA:  Severe plantar foot pain since foot surgery 3-4 weeks ago for removal of Morton's neuroma (on 01/24/2021).   EXAM: MRI OF THE RIGHT FOREFOOT WITHOUT CONTRAST   TECHNIQUE: Multiplanar, multisequence MR imaging of the right forefoot was performed. No intravenous contrast was administered.   COMPARISON:  Office foot radiographs 11/12/2020. No previous MRI available.   FINDINGS: Bones/Joint/Cartilage   Mild degenerative changes of the 1st metatarsophalangeal joint with a small subchondral cyst in the 1st metatarsal head and a small joint effusion. No other significant arthropathic changes. There is no evidence of acute fracture, dislocation or avascular necrosis.   Ligaments   The Lisfranc ligament is intact.  The collateral ligaments of the metatarsophalangeal joints appear intact.   Muscles and Tendons   There is focal susceptibility artifact within the plantar soft tissues at the level of the 2nd and 3rd metatarsal necks, presumably postsurgical. There is mild surrounding soft tissue edema as well as trace fluid within the flexor tendon sheaths of the 2nd and 3rd digits. The interosseous muscles are mildly edematous. There  is additional ill-defined low signal between the 2nd and 3rd metatarsal necks, also likely postsurgical. No drainable fluid collection.   Soft tissues   The plantar soft tissues are deformed by a capsule which was placed along the plantar aspect of the 3rd metatarsal head. As above, there is underlying soft tissue edema of and presumed postsurgical susceptibility artifact. No drainable fluid collection or soft tissue mass identified.   IMPRESSION: 1. Nonspecific soft tissue edema and susceptibility artifact in the plantar forefoot soft tissues, attributed to recent surgery. Possible mild flexor tenosynovitis of the 2nd and 3rd rays. 2. No drainable fluid collection. 3. First MTP joint degenerative changes and small effusion. No acute osseous findings.     Electronically Signed   By: Carey Bullocks M.D.   On: 04/08/2021 09:41 Pain Inventory Average Pain 7 Pain Right Now 5 My pain is sharp, burning, and stabbing  In the last 24 hours, has pain interfered with the following? General activity 7 Relation with others 8 Enjoyment of life 8 What TIME of day is your pain at its worst? evening Sleep (in general) Fair  Pain is worse with: walking, sitting, and standing Pain improves with: rest, pacing activities, and medication Relief from Meds: 6  walk without assistance how many minutes can you walk? 5-10 ability to climb steps?  yes do you drive?  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/21 I need assistance with the following:  meal prep, household duties, and shopping  trouble walking dizziness anxiety  New pt  New pt    Family History  Problem Relation Age of Onset   Depression Mother    Diabetes Mellitus II Mother    Heart Problems Father    Social History   Socioeconomic History   Marital status: Married    Spouse name: Not on file   Number of children: Not on file   Years of education: Not on file   Highest education  level: Not on file  Occupational History   Not on file  Tobacco Use   Smoking status: Never   Smokeless tobacco: Never  Vaping Use   Vaping status: Never Used  Substance and Sexual Activity   Alcohol use: Yes    Comment: social    Drug use: No   Sexual activity: Yes    Birth control/protection: Abstinence, None  Other Topics Concern   Not on file  Social History Narrative   Not on file   Social Drivers of Health   Financial Resource Strain: Low Risk  (11/02/2023)   Received from [facility]   Overall Financial Resource Strain (CARDIA)    Difficulty of Paying Living Expenses: Not hard at all  Food Insecurity: No Food Insecurity (11/02/2023)   Received from [facility]   Hunger Vital Sign    Worried About Running Out of Food in the Last Year: Never true    Ran Out of Food in the Last Year: Never true  Transportation Needs: No Transportation Needs (11/02/2023)   Received from [facility]  PRAPARE - Transportation    In the past 12 months, has lack of transportation kept you from medical appointments or from getting medications?: No    Lack of Transportation (Non-Medical): No  Physical Activity: Inactive (09/02/2021)   Received from [facility]   Exercise Vital Sign    Days of Exercise per Week: 0 days    Minutes of Exercise per Session: 0 min  Stress: Stress Concern Present (09/02/2021)   Received from [facility] - Occupational Stress Questionnaire    Feeling of Stress : Very much  Social Connections: Unknown (04/23/2022)   Received from [facility]   Social Network    Social Network: Not on file   Past Surgical History:  Procedure Laterality Date   BREAST REDUCTION SURGERY  2006   CESAREAN SECTION     CESAREAN SECTION  2000   Past Medical History:  Diagnosis Date   Allergy    There were no vitals taken for this  visit.  Opioid Risk Score:   Fall Risk Score:  `1  Depression screen PHQ 2/9     03/09/2024    1:11 PM 08/28/2023    9:56 AM 06/11/2021    2:53 PM 01/20/2016   11:27 AM  Depression screen PHQ 2/9  Decreased Interest  1 1 0  Down, Depressed, Hopeless  1 1 0  PHQ - 2 Score  2 2 0  Altered sleeping   1   Tired, decreased energy   1   Change in appetite   0   Feeling bad or failure about yourself    1   Trouble concentrating   1   Moving slowly or fidgety/restless   1   Suicidal thoughts   0   PHQ-9 Score   7   Difficult doing work/chores   Very difficult      Information is confidential and restricted. Go to Review Flowsheets to unlock data.     Review of Systems  Musculoskeletal:  Positive for gait problem.       Foot pain  Neurological:  Positive for dizziness.  All other systems reviewed and are negative.      Objective:   Physical Exam Vitals and nursing note reviewed.  Constitutional:      Appearance: She is normal weight.  HENT:     Head: Normocephalic and atraumatic.  Eyes:     Extraocular Movements: Extraocular movements intact.     Conjunctiva/sclera: Conjunctivae normal.     Pupils: Pupils are equal, round, and reactive to light.  Cardiovascular:     Rate and Rhythm: Normal rate and regular rhythm.     Heart sounds: Normal heart sounds.  Pulmonary:     Effort: Pulmonary effort is normal. No respiratory distress.     Breath sounds: Normal breath sounds.  Abdominal:     General: Abdomen is flat. Bowel sounds are normal. There is no distension.     Palpations: Abdomen is soft.  Musculoskeletal:     Cervical back: Normal range of motion.     Right foot: No deformity, bunion, Charcot foot or foot drop.     Left foot: No deformity, bunion, Charcot foot or foot drop.  Feet:     Right foot:     Skin integrity: Skin integrity normal.     Toenail Condition: Right toenails are normal.     Left foot:     Skin integrity: Skin integrity  normal.     Toenail  Condition: Left toenails are normal.     Comments: Right foot amputation  Skin:    General: Skin is warm and dry.  Neurological:     Mental Status: She is alert and oriented to person, place, and time.     Cranial Nerves: No dysarthria.     Sensory: No sensory deficit.     Motor: No weakness, tremor, atrophy or abnormal muscle tone.     Coordination: Coordination is intact.     Comments: Antalgic gait with reduced weight bearing R forefoot  Psychiatric:        Mood and Affect: Mood normal.        Behavior: Behavior normal.         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 Discussed that this is a capsaicin patch, stronger than capsaicin cream. Discussed that it is currently approved for diabetic peripheral neuropathy and post-herpetic neuralgia, but that it has also shown benefit in treating other forms of neuropathy. Provided patient with link to site to learn more about the patch: https://www.clark.biz/. Discussed that the patch would be placed in office and benefits usually last 3 months. Discussed that unintended exposure to capsaicin can cause severe irritation of eyes, mucous membranes, respiratory tract, and skin, but that Qutenza is a local treatment and does not have the systemic side effects of other nerve medications. Discussed that there may be pain, itching, erythema, and decreased sensory function associated with the application of Qutenza.  Side effects usually subside within 1 week. A cold pack of analgesic medications can help with these side effects. Blood pressure can also be increased due to pain associated with administration of the patch.   -Provided with a pain relief journal and discussed that it contains foods and lifestyle tips to naturally help to improve pain. Discussed that these lifestyle strategies are also very good for health unlike some medications which can have negative side effects. Discussed that the act of keeping a journal can be therapeutic and helpful to realize patterns what helps to trigger and alleviate pain.     2) Anxiety: -referred to psychiatry -prescribed buspar -continue lorazepam  3) Insomnia: -continue CBD gummies -continue meditation -Try to go outside near sunrise -Get exercise during the day.  -Turn off all devices an hour before bedtime.  -Teas that can benefit: chamomile, valerian root, Brahmi (Bacopa) -Can consider over the counter melatonin, magnesium, and/or L-theanine. Melatonin is an anti-oxidant with multiple health benefits. Magnesium is involved in greater than 300 enzymatic reactions in the body and most of [place] are deficient as our soil is often depleted. There are 7 different types of magnesium- Bioptemizer's is a supplement with all 7 types, and each has unique benefits. Magnesium can also help with constipation and anxiety.  -Pistachios naturally increase the production of melatonin -Cozy Earth bamboo bed sheets are free from toxic chemicals.  -Tart cherry juice or a tart cherry supplement can improve sleep and soreness post-workout  4) PTSD: -discussed  benefits of EMDR therapy with [facility]

## 2024-04-11 ENCOUNTER — Encounter: Payer: Self-pay | Admitting: Physical Medicine and Rehabilitation

## 2024-04-12 ENCOUNTER — Other Ambulatory Visit: Payer: Self-pay | Admitting: Physical Medicine and Rehabilitation

## 2024-04-12 MED ORDER — GABAPENTIN 800 MG PO TABS: 800.0000 mg | ORAL_TABLET | Freq: Three times a day (TID) | ORAL | 3 refills | Status: AC

## 2024-04-13 DIAGNOSIS — G90521 Complex regional pain syndrome I of right lower limb: Secondary | ICD-10-CM | POA: Diagnosis not present

## 2024-04-13 DIAGNOSIS — Z9689 Presence of other specified functional implants: Secondary | ICD-10-CM | POA: Diagnosis not present

## 2024-04-13 DIAGNOSIS — G546 Phantom limb syndrome with pain: Secondary | ICD-10-CM | POA: Diagnosis not present

## 2024-04-18 ENCOUNTER — Ambulatory Visit (INDEPENDENT_AMBULATORY_CARE_PROVIDER_SITE_OTHER): Admitting: Licensed Clinical Social Worker

## 2024-04-18 DIAGNOSIS — F4322 Adjustment disorder with anxiety: Secondary | ICD-10-CM | POA: Diagnosis not present

## 2024-04-18 DIAGNOSIS — F320 Major depressive disorder, single episode, mild: Secondary | ICD-10-CM | POA: Diagnosis not present

## 2024-04-18 NOTE — Progress Notes (Signed)
 THERAPIST PROGRESS NOTE  Virtual Visit via Video Note  I connected with Ashley Brandt on 04/18/24 at 10:00 AM EDT by a video enabled telemedicine application and verified that I am speaking with the correct person using two identifiers.  Location: Patient: Address on file  Provider: [facility]   I discussed the limitations of evaluation and management by telemedicine and the availability of in person appointments. The patient expressed understanding and agreed to proceed    I discussed the assessment and treatment plan with the patient. The patient was provided an opportunity to ask questions and all were answered. The patient agreed with the plan and demonstrated an understanding of the instructions.   The patient was advised to call back or seek an in-person evaluation if the symptoms worsen or if the condition fails to improve as anticipated.  I provided 56 minutes of non-face-to-face time during this encounter.   Marvin Slot, LCSW   Session Time: 10-10:56am  Participation Level: Active  Behavioral Response: CasualAlertEuthymic  Type of Therapy: Individual Therapy  Treatment Goals addressed:  Problem: BH CCP Acute or Chronic Trauma Reaction     Dates: Start:  03/09/24       Disciplines: Interdisciplinary, Counselor, PROVIDER        Goal: LTG: Elimination of maladaptive behaviors and thinking patterns which interfere with resolution of trauma as evidenced by self report     Dates: Start:  03/09/24    Expected End:  09/09/24       Disciplines: Interdisciplinary, PROVIDER             Goal: LTG: Recall traumatic events without becoming overwhelmed with negative emotions     Dates: Start:  03/09/24    Expected End:  09/09/24       Disciplines: Interdisciplinary, PROVIDER             Goal: STG: Pt reports she would like to learn how to set boundaries with her mother around religious beliefs     Dates: Start:  03/09/24    Expected End:  09/09/24        Disciplines: Interdisciplinary, PROVIDER             Goal: LTG: "Address childhood trauma centered around religion."     Dates: Start:  03/09/24    Expected End:  09/09/24       Disciplines: Interdisciplinary, PROVIDER             Goal: LTG - Misc 1     Dates: Start:  03/09/24    Expected End:  09/09/24       Description: "Address PTSD related to all my medical stuff."     Disciplines: Counselor      ProgressTowards Goals: Initial  Interventions: CBT and Reframing, supportive  Summary: Ashley Brandt is a 53 y.o. female who presents with sxs to include but not limited to Change in energy/activity; Difficulty Concentrating; Fatigue; Hopelessness; Increase/decrease in appetite; Irritability; Tearfulness; Worthlessness;   Restlessness; Sleep; Tension; Worrying; Irritability/anger; Detachment from others; Emotional numbing; Guilt/shame. Pt was oriented times 5. Pt was cooperative and engaged. Pt denies SI/HI/AVH.     The patient utilized the therapeutic space to reflect on topics she would like to cover as she begins her therapeutic journey. She mentioned that she has been attempting to journal to clarify her feelings about certain life events. The patient identified several issues to address in upcoming therapy appointments, including religious trauma, deep issues regarding her relationship with her mother, pain  from a recent amputation, and conflict with one of her oldest child's friends. She cited all these life events as sources of anxiety.  The patient also reflected on discord with a friend, noting that the friend's issues with alcohol  have contributed to problems in her marriage. She expressed that she no longer feels this person is safe for her, as they trigger feelings related to criticism from her mother. The patient discussed how her relationship with her mother and her attempts to avoid becoming like her have influenced her life choices, particularly concerning traits and certain  friendships. She has been working on establishing healthier boundaries in her personal relationships.  The clinician provided psychoeducation on generational trauma and discussed the main issue the patient wishes to address through EMDR treatment. The patient expressed a desire to process her chronic pain using EMDR first.  The clinician also explored the patient's understanding of inner child work, noting that she is very well-versed  in it. 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 She has been working on writing "no send" letters to her mother to process her feelings. The clinician explored the patient's comfort with creating a "no send" letter from both her perspective and that of her child, to which the patient agreed. They will reflect on this letter in upcoming sessions.  Suicidal/Homicidal: Nowithout intent/plan  Therapist Response: Clinician utilized active and supportive reflection to create a safe space for patient to process recent life events.  Clinician assessed for current stressors, symptoms, safety since last session.  Reflected on dynamics within personal relationships and work to reframe negative perspectives.  Briefly visited in her child work.  Plan: Return again in 2 weeks.  Diagnosis: Adjustment disorder with anxious mood  Current mild episode of major depressive disorder, unspecified whether recurrent (HCC)   Collaboration of Care:  [facility] psychiatrist can access notes and cln. Will review psychiatrists' notes. Check in with the patient and will see LCSW per availability. Patient agreed with treatment recommendations.   Patient/Guardian was advised Release of Information must be obtained prior to any record release in order to collaborate their care with an outside provider. Patient/Guardian was advised if they have not already done so to contact the registration department to sign all necessary forms in order for us   to release information regarding their care.   Consent: Patient/Guardian gives verbal consent for treatment and assignment of benefits for services provided during this visit. Patient/Guardian expressed understanding and agreed to proceed.   Marvin Slot, LCSW 04/18/2024

## 2024-04-22 ENCOUNTER — Telehealth: Admitting: Physical Medicine and Rehabilitation

## 2024-04-25 ENCOUNTER — Encounter: Attending: Physical Medicine and Rehabilitation | Admitting: Physical Medicine and Rehabilitation

## 2024-04-25 DIAGNOSIS — G5771 Causalgia of right lower limb: Secondary | ICD-10-CM | POA: Insufficient documentation

## 2024-04-25 NOTE — Progress Notes (Signed)
 Subjective:    Patient ID: Ashley Brandt, female    DOB: [birth year], 53 y.o.   MRN: [id number]  HPI An audio/video tele-health visit is felt to be the most appropriate encounter for this patient at this time. This is a follow up tele-visit via phone. The patient is at home. MD is at office. Prior to scheduling this appointment, our staff discussed the limitations of evaluation and management by telemedicine and the availability of in-person appointments. The patient expressed understanding and agreed to proceed.   CC: RIght foot /toe pain  15) CRPS 53 year old female referred by her primary care physician for the evaluation of right foot and toe pain.  The patient states that she has 2 types of pain 1 that is on the bottom of the foot right around the area of the surgery the other area is more in the middle of the foot.  She really has few symptoms at the ankle or above.  She notes mild swelling in the right foot sometimes discoloration as well.  It is very hypersensitive to touch particularly on the plantar surface. 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  She was started on Lyrica  and had some side effects and did not wish to continue taking it.  The patient also had a behavioral health evaluation 04/02/2021 diagnosed with insomnia and adjustment disorder with mixed anxiety depressed mood.  She tried Ambien  and melatonin.  She has been on Cymbalta  30 mg up to 60 mg a day.  She is now on gabapentin  600 mg 3 times per day.  The patient has had a second opinion with orthopedic surgeon.  The patient has had an arterial Doppler on 04/23/2021 which was normal.  She is scheduling surgery at [facility] with a peripheral neurosurgeon with plans for resection of neuroma as well as wrapping the end of the neuroma with abdominal epidermis to prevent reforming  -she has always been active and started to run a lot more during the  pandemic. 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 She has had more pain and less function.   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   EXAM: MRI OF THE RIGHT FOREFOOT WITHOUT CONTRAST   TECHNIQUE: Multiplanar, multisequence MR imaging of the right forefoot was performed. No intravenous contrast was administered.   COMPARISON:  Office foot radiographs 11/12/2020. No previous MRI available.   FINDINGS: Bones/Joint/Cartilage   Mild degenerative changes of the 1st metatarsophalangeal joint with a small subchondral cyst in the 1st metatarsal head and a small joint effusion. No other significant arthropathic changes. There is no evidence of acute fracture, dislocation or avascular necrosis.   Ligaments  The Lisfranc ligament is intact. The collateral ligaments of the metatarsophalangeal joints appear intact.   Muscles and Tendons   There is focal susceptibility artifact within the plantar soft tissues at the level of the 2nd and 3rd metatarsal necks, presumably postsurgical. There is mild surrounding soft tissue edema as well as trace fluid within the flexor tendon sheaths of the 2nd and 3rd digits. The interosseous muscles are mildly  edematous. There is additional ill-defined low signal between the 2nd and 3rd metatarsal necks, also likely postsurgical. No drainable fluid collection.   Soft tissues   The plantar soft tissues are deformed by a capsule which was placed along the plantar aspect of the 3rd metatarsal head. As above, there is underlying soft tissue edema of and presumed postsurgical susceptibility artifact. No drainable fluid collection or soft tissue mass identified.   IMPRESSION: 1. Nonspecific soft tissue edema and susceptibility artifact in the plantar forefoot soft tissues, attributed to recent surgery. Possible mild flexor tenosynovitis of the 2nd and 3rd rays. 2. No drainable fluid collection. 3. First MTP joint degenerative changes and small effusion. No acute osseous findings.     Electronically Signed   By: Elmon Hagedorn M.D.   On: 04/08/2021 09:41 Pain Inventory Average Pain 7 Pain Right Now 5 My pain is sharp, burning, and stabbing  In the last 24 hours, has pain interfered with the following? General activity 7 Relation with others 8 Enjoyment of life 8 What TIME of day is your pain at its worst? evening Sleep (in general) Fair  Pain is worse with: walking, sitting, and standing Pain improves with: rest, pacing activities, and medication Relief from Meds: 6  walk without assistance how many minutes can you walk? 5-10 ability to climb steps?  yes do you drive?  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/21 I need assistance with the following:  meal prep, household duties, and shopping  trouble walking dizziness anxiety  New pt  New pt    Family History  Problem Relation Age of Onset   Depression Mother    Diabetes Mellitus II Mother    Heart Problems Father    Social History   Socioeconomic History   Marital status: Married    Spouse name: Not on file   Number of children: Not on file   Years of education: Not on file    Highest education level: Not on file  Occupational History   Not on file  Tobacco Use   Smoking status: Never   Smokeless tobacco: Never  Vaping Use   Vaping status: Never Used  Substance and Sexual Activity   Alcohol  use: Yes    Comment: social    Drug use: No   Sexual activity: Yes    Birth control/protection: Abstinence, None  Other Topics Concern   Not on file  Social History Narrative   Not on file   Social Drivers of Health   Financial Resource Strain: Low Risk  (11/02/2023)   Received from [facility]   Overall Financial Resource Strain (CARDIA)    Difficulty of Paying Living Expenses: Not hard at all  Food Insecurity: No Food Insecurity (11/02/2023)   Received from [facility]   Hunger Vital Sign    Worried About Running Out of Food in the Last Year: Never true    Ran Out of Food in the Last Year: Never true  Transportation Needs: No Transportation Needs (11/02/2023)   Received from  [facility]   PRAPARE - Transportation    In the past 12 months, has lack of transportation kept you from medical appointments or from getting medications?: No    Lack of Transportation (Non-Medical): No  Physical Activity: Inactive (09/02/2021)   Received from [facility]   Exercise Vital Sign    Days of Exercise per Week: 0 days    Minutes of Exercise per Session: 0 min  Stress: Stress Concern Present (09/02/2021)   Received from [facility] - Occupational Stress Questionnaire    Feeling of Stress : Very much  Social Connections: Unknown (04/23/2022)   Received from [facility]   Social Network    Social Network: Not on file   Past Surgical History:  Procedure Laterality Date   BREAST REDUCTION SURGERY  2006   CESAREAN SECTION     CESAREAN SECTION  2000   Past Medical History:  Diagnosis Date   Allergy    There were no vitals  taken for this visit.  Opioid Risk Score:   Fall Risk Score:  `1  Depression screen PHQ 2/9     03/09/2024    1:11 PM 08/28/2023    9:56 AM 06/11/2021    2:53 PM 01/20/2016   11:27 AM  Depression screen PHQ 2/9  Decreased Interest  1 1 0  Down, Depressed, Hopeless  1 1 0  PHQ - 2 Score  2 2 0  Altered sleeping   1   Tired, decreased energy   1   Change in appetite   0   Feeling bad or failure about yourself    1   Trouble concentrating   1   Moving slowly or fidgety/restless   1   Suicidal thoughts   0   PHQ-9 Score   7   Difficult doing work/chores   Very difficult      Information is confidential and restricted. Go to Review Flowsheets to unlock data.     Review of Systems  Musculoskeletal:  Positive for gait problem.       Foot pain  Neurological:  Positive for dizziness.  All other systems reviewed and are negative.      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, vasomotor symptoms fairly mild -continue nortriptyline , change to night -d/c cymbalta   -continue gabapentin , refilled  -discussed that she had spinal cord stimulator placed by [facility] 3 weeks ago, discussed risks of tolerance over time  -discussed Scrambler therapy, recommended that she discuss this with her neurosurgeon as well, discussed cost of the therapy  -discussed that she is pursuing osseointegration in [place]  Prescribed Ketamine 20%, Baclofen  2%, Cyclobenzaprine 2%, Ketoprofen 10%, Gabapentin  20%, Bupivacaine 1%, Amitryptiline 5%, clonidine 0.2% compounded cream  Dispense 240 g  Apply 1-2 g to affected area 3-4 times per day   -Discussed Qutenza as an option for neuropathic pain control. Discussed that this is a capsaicin patch, stronger than capsaicin cream. Discussed that it is currently approved for diabetic peripheral neuropathy and post-herpetic neuralgia, but that it has also shown benefit in  treating other forms of neuropathy. Provided patient with link to site to learn more about the patch: https://www.clark.biz/. Discussed that the patch would be placed in office and benefits usually last 3 months. Discussed that unintended exposure to capsaicin  can cause severe irritation of eyes, mucous membranes, respiratory tract, and skin, but that Qutenza is a local treatment and does not have the systemic side effects of other nerve medications. Discussed that there may be pain, itching, erythema, and decreased sensory function associated with the application of Qutenza. Side effects usually subside within 1 week. A cold pack of analgesic medications can help with these side effects. Blood pressure can also be increased due to pain associated with administration of the patch.   -Provided with a pain relief journal and discussed that it contains foods and lifestyle tips to naturally help to improve pain. Discussed that these lifestyle strategies are also very good for health unlike some medications which can have negative side effects. Discussed that the act of keeping a journal can be therapeutic and helpful to realize patterns what helps to trigger and alleviate pain.     2) Anxiety: -referred to psychiatry -prescribed buspar  -continue lorazepam   3) Insomnia: -continue CBD gummies -continue meditation -Try to go outside near sunrise -Get exercise during the day.  -Turn off all devices an hour before bedtime.  -Teas that can benefit: chamomile, valerian root, Brahmi (Bacopa) -Can consider over the counter melatonin, magnesium, and/or L-theanine. Melatonin is an anti-oxidant with multiple health benefits. Magnesium is involved in greater than 300 enzymatic reactions in the body and most of us  are deficient as our soil is often depleted. There are 7 different types of magnesium- Bioptemizer's is a supplement with all 7 types, and each has unique benefits. Magnesium can also help with  constipation and anxiety.  -Pistachios naturally increase the production of melatonin -Cozy Earth bamboo bed sheets are free from toxic chemicals.  -Tart cherry juice or a tart cherry supplement can improve sleep and soreness post-workout  4) PTSD: -discussed benefits of EMDR therapy with Hildegard Low   5 minutes spent in discussion of compounded cream, that she is interested in version with 20% ketamine and gabapentin  and the additional pain relievers that I use in my typical compounded cream

## 2024-04-26 ENCOUNTER — Ambulatory Visit (INDEPENDENT_AMBULATORY_CARE_PROVIDER_SITE_OTHER): Admitting: Licensed Clinical Social Worker

## 2024-04-26 DIAGNOSIS — F4322 Adjustment disorder with anxiety: Secondary | ICD-10-CM | POA: Diagnosis not present

## 2024-04-26 DIAGNOSIS — F320 Major depressive disorder, single episode, mild: Secondary | ICD-10-CM

## 2024-04-26 NOTE — Progress Notes (Signed)
 THERAPIST PROGRESS NOTE  Virtual Visit via Video Note  I connected with Ashley Brandt on 04/26/24 at  9:00 AM EDT by a video enabled telemedicine application and verified that I am speaking with the correct person using two identifiers.  Location: Patient: Address on file Provider: [facility]   I discussed the limitations of evaluation and management by telemedicine and the availability of in person appointments. The patient expressed understanding and agreed to proceed.    I discussed the assessment and treatment plan with the patient. The patient was provided an opportunity to ask questions and all were answered. The patient agreed with the plan and demonstrated an understanding of the instructions.   The patient was advised to call back or seek an in-person evaluation if the symptoms worsen or if the condition fails to improve as anticipated.  I provided 49 minutes of non-face-to-face time during this encounter.   Marvin Slot, LCSW   Session Time: 9:03-9:52am  Participation Level: Active  Behavioral Response: CasualAlertEuthymic  Type of Therapy: Individual Therapy  Treatment Goals addressed:  Problem: BH CCP Acute or Chronic Trauma Reaction      Dates: Start:  03/09/24        Disciplines: Interdisciplinary, Counselor, PROVIDER                 Goal: LTG: Elimination of maladaptive behaviors and thinking patterns which interfere with resolution of trauma as evidenced by self report     Dates: Start:  03/09/24    Expected End:  09/09/24       Disciplines: Interdisciplinary, PROVIDER                     Goal: LTG: Recall traumatic events without becoming overwhelmed with negative emotions     Dates: Start:  03/09/24    Expected End:  09/09/24       Disciplines: Interdisciplinary, PROVIDER                     Goal: STG: Pt reports she would like to learn how to set boundaries with her mother around religious beliefs     Dates: Start:  03/09/24    Expected  End:  09/09/24       Disciplines: Interdisciplinary, PROVIDER                     Goal: LTG: "Address childhood trauma centered around religion."     Dates: Start:  03/09/24    Expected End:  09/09/24       Disciplines: Interdisciplinary, PROVIDER                     Goal: LTG - Misc 1     Dates: Start:  03/09/24    Expected End:  09/09/24       Description: "Address PTSD related to all my medical stuff."     Disciplines: Counselor      ProgressTowards Goals: Progressing  Interventions: CBT, Supportive, Reframing, and Other: EMDR  Summary: Ashley Brandt is a 53 y.o. female who presents with sxs to include but not limited to Change in energy/activity; Difficulty Concentrating; Fatigue; Hopelessness; Increase/decrease in appetite; Irritability; Tearfulness; Worthlessness;   Restlessness; Sleep; Tension; Worrying; Irritability/anger; Detachment from others; Emotional numbing; Guilt/shame. Pt was oriented times 5. Pt was cooperative and engaged. Pt denies SI/HI/AVH.     The patient began EMDR treatment. During the session, the clinician worked with her to complete a  chronic pain values inventory and the pain catastrophizing scale. 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 She expressed feelings of not being a fun mom anymore and her inability to engage in physical activities with her children.   The patient also noted a shift in her marriage from an "equal partnership" to feeling less capable of contributing around the home due to her loss of independence. She identified the consequences of feeling a lack of sympathy related to her chronic pain, which has led to her feeling left out of her social circle. The clinician assisted the patient in recognizing patterns of negative thinking associated with the activities she can no longer do. They discussed strategies for reframing her perspective to focus  on what she can still accomplish despite her physical changes. For homework, the patient was asked to keep a list of her current capabilities, no matter how small.  The clinician introduced EMDR by helping the patient establish resources and educating her on various breathing techniques, including 7-11 breathing, acupressure breathing, belly breathing, and eye roll breathing. The patient identified eye roll breathing and acupressure breathing as the most effective techniques for her. She plans to practice these skills regularly until her next session.  Suicidal/Homicidal: Nowithout intent/plan  Therapist Response: Clinician utilized active and supportive reflection to create a safe space for patient to process recent life events. Clinician assessed for current stressors, symptoms, safety since last session.  Reflected on the impact pain and physical changes have hide on relationships and self-confidence.  Began EMDR treatment with patient by resourcing.  Plan: Return again in 1 week.  Diagnosis: Adjustment disorder with anxious mood  Current mild episode of major depressive disorder, unspecified whether recurrent (HCC)   Collaboration of Care: AEB psychiatrist can access notes and cln. Will review psychiatrists' notes. Check in with the patient and will see LCSW per availability. Patient agreed with treatment recommendations.   Patient/Guardian was advised Release of Information must be obtained prior to any record release in order to collaborate their care with an outside provider. Patient/Guardian was advised if they have not already done so to contact the registration department to sign all necessary forms in order for us  to release information regarding their care.   Consent: Patient/Guardian gives verbal consent for treatment and assignment of benefits for services provided during this visit. Patient/Guardian expressed understanding and agreed to proceed.   Marvin Slot,  LCSW 04/26/2024

## 2024-04-27 ENCOUNTER — Other Ambulatory Visit: Payer: Self-pay | Admitting: Psychiatry

## 2024-04-27 DIAGNOSIS — L820 Inflamed seborrheic keratosis: Secondary | ICD-10-CM | POA: Diagnosis not present

## 2024-04-29 DIAGNOSIS — M792 Neuralgia and neuritis, unspecified: Secondary | ICD-10-CM | POA: Diagnosis not present

## 2024-04-29 DIAGNOSIS — E785 Hyperlipidemia, unspecified: Secondary | ICD-10-CM | POA: Diagnosis not present

## 2024-05-02 ENCOUNTER — Ambulatory Visit (INDEPENDENT_AMBULATORY_CARE_PROVIDER_SITE_OTHER): Admitting: Licensed Clinical Social Worker

## 2024-05-02 DIAGNOSIS — F320 Major depressive disorder, single episode, mild: Secondary | ICD-10-CM | POA: Diagnosis not present

## 2024-05-02 DIAGNOSIS — F4322 Adjustment disorder with anxiety: Secondary | ICD-10-CM | POA: Diagnosis not present

## 2024-05-02 NOTE — Progress Notes (Signed)
 THERAPIST PROGRESS NOTE  Virtual Visit via Video Note  I connected with Ashley Brandt on 05/02/24 at 11:00 AM EDT by a video enabled telemedicine application and verified that I am speaking with the correct person using two identifiers.  Location: Patient: Address on file  Provider: [facility]   I discussed the limitations of evaluation and management by telemedicine and the availability of in person appointments. The patient expressed understanding and agreed to proceed.   I discussed the assessment and treatment plan with the patient. The patient was provided an opportunity to ask questions and all were answered. The patient agreed with the plan and demonstrated an understanding of the instructions.   The patient was advised to call back or seek an in-person evaluation if the symptoms worsen or if the condition fails to improve as anticipated.  I provided 50 minutes of non-face-to-face time during this encounter.   Marvin Slot, LCSW   Session Time: 11-11:50am  Participation Level: Active  Behavioral Response: CasualAlertEuthymic  Type of Therapy: Individual Therapy  Treatment Goals addressed:  Problem: BH CCP Acute or Chronic Trauma Reaction        Dates: Start:  03/09/24          Disciplines: Interdisciplinary, Counselor, PROVIDER                       Goal: LTG: Elimination of maladaptive behaviors and thinking patterns which interfere with resolution of trauma as evidenced by self report     Dates: Start:  03/09/24    Expected End:  09/09/24       Disciplines: Interdisciplinary, PROVIDER                         Goal: LTG: Recall traumatic events without becoming overwhelmed with negative emotions     Dates: Start:  03/09/24    Expected End:  09/09/24       Disciplines: Interdisciplinary, PROVIDER                         Goal: STG: Pt reports she would like to learn how to set boundaries with her mother around religious beliefs     Dates: Start:   03/09/24    Expected End:  09/09/24       Disciplines: Interdisciplinary, PROVIDER                         Goal: LTG: "Address childhood trauma centered around religion."     Dates: Start:  03/09/24    Expected End:  09/09/24       Disciplines: Interdisciplinary, PROVIDER                         Goal: LTG - Misc 1     Dates: Start:  03/09/24    Expected End:  09/09/24       Description: "Address PTSD related to all my medical stuff."     Disciplines: Counselor       ProgressTowards Goals: Progressing  Interventions: Supportive and Other: EMDR  Summary: Ashley Brandt is a 53 y.o. female who presents with sxs to include but not limited to Change in energy/activity; Difficulty Concentrating; Fatigue; Hopelessness; Increase/decrease in appetite; Irritability; Tearfulness; Worthlessness; Restlessness; Sleep; Tension; Worrying; Irritability/anger; Detachment from others; Emotional numbing; Guilt/shame. Pt was oriented times 5. Pt was cooperative and engaged. Pt  denies SI/HI/AVH.   Reflected on coping skills discussed in the previous session. Patient reflected on the successful use of bilateral tapping and acupressure breathing. Reports more comfort with box breathing.  Clinician continued EMDR by constructing patient's container and peaceful place.  Patient will utilize these resources routinely until next session.  Clinician began assessing for patient's core negative belief with patient narrowing down to "I am not good" and "I cannot trust my judgment."  Patient reflected on how recent amputation as well as religious trauma has led to instill these negative beliefs.  She also reflected on how her relationship with her mother and difficulties amongst communication/expectations has served as a barrier to her feeling comfortable with herself.  Clinician discussed future EMDR sessions related to chronic pain but advised patient to process emotionally charged memories/trauma first as  patient has a series of upcoming serious surgical procedures.  Patient agreed.   Suicidal/Homicidal: Nowithout intent/plan  Therapist Response: Clinician utilized active and supportive reflection to create a safe space for patient to process recent life events. Clinician assessed for current stressors, symptoms, safety since last session.  Continued EMDR treatment with patient by resourcing and identifying negative cognitions for her TSP.    Plan: Return again in 2 weeks.  Diagnosis: Adjustment disorder with anxious mood  Current mild episode of major depressive disorder, unspecified whether recurrent (HCC)   Collaboration of Care: AEB psychiatrist can access notes and cln. Will review psychiatrists' notes. Check in with the patient and will see LCSW per availability. Patient agreed with treatment recommendations.   Patient/Guardian was advised Release of Information must be obtained prior to any record release in order to collaborate their care with an outside provider. Patient/Guardian was advised if they have not already done so to contact the registration department to sign all necessary forms in order for us  to release information regarding their care.   Consent: Patient/Guardian gives verbal consent for treatment and assignment of benefits for services provided during this visit. Patient/Guardian expressed understanding and agreed to proceed.   Marvin Slot, LCSW 05/02/2024

## 2024-05-05 DIAGNOSIS — G90521 Complex regional pain syndrome I of right lower limb: Secondary | ICD-10-CM | POA: Diagnosis not present

## 2024-05-07 NOTE — Progress Notes (Signed)
 Virtual Visit via Video Note  I connected with Ashley Brandt on 05/11/24 at  4:00 PM EDT by a video enabled telemedicine application and verified that I am speaking with the correct person using two identifiers.  Location: Patient: home Provider: home office Persons participated in the visit- patient, provider    I discussed the limitations of evaluation and management by telemedicine and the availability of in person appointments. The patient expressed understanding and agreed to proceed.   I discussed the assessment and treatment plan with the patient. The patient was provided an opportunity to ask questions and all were answered. The patient agreed with the plan and demonstrated an understanding of the instructions.   The patient was advised to call back or seek an in-person evaluation if the symptoms worsen or if the condition fails to improve as anticipated.    Todd Fossa, MD    [facility] MD/PA/NP OP Progress Note  05/11/2024 5:04 PM [name]  MRN:  [id number]  Chief Complaint:  Chief Complaint  Patient presents with   Follow-up   HPI:  - she underwent spinal cord stimulator March 2025.   This is a follow-up appointment for adjustment disorder and insomnia.  She states that she has spinal cord stimulator for the past 2 months.  Although she feels discomfort in the back, she feels very pleased with the healing process.  She also believes it has protected the good leg, although she used to have some pain.  She will have osseointegration surgery next month in [place].  She feels very nervous about this, especially considering the prior experience with surgery.  She has been working part-time.  She loves people who she works with.  She was offered for a full-time, although she is concerning to stay on part-time due to her medical needs.  She reports a sense of insecurity, not feeling good enough. She tends to be a perfectionist, and letting things go is not easily.  She has  recognized that how her that she has been on herself to be perfect, and she misses softness. She agrees that this may stem from her childhood experiences related to religion, stating that she felt brainwashed at the time.  She denies feeling depressed.  She denies SI. She denies panic attacks.  She has been taking a half dose of lorazepam . She does not sleep sound as 1 mg, and has been taking delta 9.   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 She considers her mother to be the best example of a [place]. She describes her childhood as generally positive, though there were challenges related to religion. Growing up, her family attended a fundamentalist church, and she recalls feeling conflicted during that time. She moved to [place] for her husband work,   Visit Diagnosis:    ICD-10-CM   1. Adjustment disorder with anxious mood  F43.22  2. Insomnia, unspecified type  G47.00       Past Psychiatric History: Please see initial evaluation for full details. I have reviewed the history. No updates at this time.     Past Medical History:  Past Medical History:  Diagnosis Date   Allergy     Past Surgical History:  Procedure Laterality Date   BREAST REDUCTION SURGERY  2006   CESAREAN SECTION     CESAREAN SECTION  2000    Family Psychiatric History: Please see initial evaluation for full details. I have reviewed the history. No updates at this time.      Family History:  Family History  Problem Relation Age of Onset   Depression Mother    Diabetes Mellitus II Mother    Heart Problems Father     Social History:  Social History   Socioeconomic History   Marital status: Married    Spouse name: Not on file   Number of children: Not on file   Years of education: Not on file   Highest education level: Not on file  Occupational History   Not on file  Tobacco Use   Smoking status: Never   Smokeless tobacco: Never  Vaping Use   Vaping status: Never Used  Substance and Sexual Activity   Alcohol  use: Yes    Comment: social    Drug use: No   Sexual activity: Yes    Birth control/protection: Abstinence, None  Other Topics Concern   Not on file  Social History Narrative   Not on file   Social Drivers of Health   Financial Resource Strain: Low Risk  (11/02/2023)   Received from [facility]   Overall Financial Resource Strain (CARDIA)    Difficulty of Paying Living Expenses: Not hard at all  Food Insecurity: No Food Insecurity (11/02/2023)   Received from [facility]   Hunger Vital Sign    Worried About Running Out of Food in the Last Year: Never true    Ran Out of Food in the Last Year: Never true  Transportation Needs: No Transportation Needs (11/02/2023)   Received from [facility] - Transportation    In the past 12 months, has lack of transportation kept you from medical appointments or from getting medications?: No    Lack of Transportation (Non-Medical): No  Physical Activity: Inactive (09/02/2021)   Received from [facility]   Exercise Vital Sign    Days of Exercise per Week: 0 days    Minutes of Exercise per Session: 0 min  Stress: Stress Concern Present (09/02/2021)   Received from [facility] - Occupational Stress Questionnaire    Feeling of Stress : Very much  Social  Connections: Unknown (04/23/2022)   Received from [facility]   Social Network    Social Network: Not on file    Allergies:  Allergies  Allergen Reactions   Dust Mite Mixed Allergen Ext [Mite (D. Farinae)]     Metabolic Disorder Labs: No results found for: "HGBA1C", "MPG" No results found for: "PROLACTIN" No results found for: "CHOL", "TRIG", "HDL", "CHOLHDL", "VLDL", "LDLCALC" No results found for: "TSH"  Therapeutic Level Labs: No results found for: "LITHIUM" No results found for: "VALPROATE" No results found for: "CBMZ"  Current Medications: Current Outpatient Medications  Medication Sig Dispense Refill   Ascorbic Acid (VITAMIN C) 1000 MG tablet Take 1,000 mg by mouth  daily.     baclofen  (LIORESAL ) 10 MG tablet Take 1 tablet (10 mg total) by mouth 2 (two) times daily as needed for muscle spasms. 180 each 3   busPIRone  (BUSPAR ) 5 MG tablet Take 1 tablet (5 mg total) by mouth 3 (three) times daily as needed. 90 tablet 3   cholecalciferol (VITAMIN D3) 25 MCG (1000 UNIT) tablet Take 5,000 Units by mouth daily.     gabapentin  (NEURONTIN ) 800 MG tablet Take 1 tablet (800 mg total) by mouth 3 (three) times daily. 270 tablet 3   [START ON 05/27/2024] LORazepam  (ATIVAN ) 1 MG tablet Take 1 tablet (1 mg total) by mouth daily as needed for anxiety. 30 tablet 1   magnesium 30 MG tablet Take 30 mg by mouth 2 (two) times daily. 120mg      Multiple Vitamin (MULTIVITAMIN) tablet Take 1 tablet by mouth daily.     nortriptyline  (PAMELOR ) 25 MG capsule Take 1 capsule (25 mg total) by mouth 2 (two) times daily. 180 capsule 3   Omega-3 Fatty Acids (FISH OIL) 1000 MG CAPS Take 3,600 mg by mouth daily.     vitamin B-12 (CYANOCOBALAMIN) 1000 MCG tablet Take 1,000 mcg by mouth daily. 500 mcg     No current facility-administered medications for this visit.     Musculoskeletal: Strength & Muscle Tone: N/A Gait & Station: N/A Patient leans: N/A  Psychiatric Specialty Exam: Review of  Systems  Psychiatric/Behavioral:  Positive for sleep disturbance. Negative for agitation, behavioral problems, confusion, decreased concentration, dysphoric mood, hallucinations, self-injury and suicidal ideas. The patient is nervous/anxious. The patient is not hyperactive.   All other systems reviewed and are negative.   There were no vitals taken for this visit.There is no height or weight on file to calculate BMI.  General Appearance: Well Groomed  Eye Contact:  Good  Speech:  Clear and Coherent  Volume:  Normal  Mood:  ok  Affect:  Appropriate, Congruent, and calm  Thought Process:  Coherent  Orientation:  Full (Time, Place, and Person)  Thought Content: Logical   Suicidal Thoughts:  No  Homicidal Thoughts:  No  Memory:  Immediate;   Good  Judgement:  Good  Insight:  Good  Psychomotor Activity:  Normal  Concentration:  Concentration: Good and Attention Span: Good  Recall:  Good  Fund of Knowledge: Good  Language: Good  Akathisia:  No  Handed:  Right  AIMS (if indicated): not done  Assets:  Communication Skills Desire for Improvement  ADL's:  Intact  Cognition: WNL  Sleep:  Fair   Screenings: GAD-7    Advertising copywriter from 03/09/2024 in [facility]  Total GAD-7 Score 11      PHQ2-9    Flowsheet Row Counselor from 03/09/2024 in [facility] Office Visit from 08/28/2023 in [facility] Office Visit from 06/11/2021 in [facility] Office Visit from 01/20/2016 in Primary Care at [facility] Total Score 6 2 2  0  PHQ-9 Total Score 9 -- 7 --      Flowsheet Row Counselor from 03/09/2024 in [facility] ED from 03[facility]22/2023 in [facility] from 06/17/2022 in [facility] at [facility] RISK CATEGORY No Risk No Risk No Risk         Assessment and Plan:  ANDRA HESLIN is a 53 y.o. 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   1. Adjustment disorder with anxious mood She reports health anxiety, influenced by her father's death in his 47s and her mother's history of diabetes. Psychologically, she describes experiencing internal conflict related to her upbringing in a fundamentalist church.  History: no prior mental health history prior to BKA.  Although she reports anxiety related to pain, and upcoming procedure, she remains engaged with her family, including her husband, and is working toward acceptance as a means to move forward from her current situation. She is not interested in adjustment of her medication for her mood  and prefers to wait and assess the effectiveness of the spinal cord stimulator. This plan will be honored. Noted that she is on nortriptyline , which could be affective for anxiety.  Noted that although she may benefit from prazosin, she tends to have low blood pressure, which precludes its use.    2. Insomnia, unspecified type - UDS positive for cannabinoid, 11/2023. On Sound Asleep, which contains L-Theanine 100mg , Full Spectrum Hemp Extract (from hemp extract aerial parts) 58mg , Cannabidiol (CBD) (hemp extract aerial parts) 50mg , THC (from hemp extract aerial parts) 5mg , Additional Minor Cannabinoids(from hemp extract aerial parts) 3mg , Melatonin 3mg    Although she has been able to cut down clonazepam use, she reports slight worsening in sleep, and has been using delta 9.  Will continue current dose of clonazepam at this time to target insomnia.  She is aware of the potential long-term risk of dependence, tolerance, and risk of respiratory suppression with concomitant use of opioid, gabapentin .  She has demonstrated the ability to discontinue its use when not needed.      # delta 8/9 use  She uses this occasionally for pain, and insomnia. We discussed concerns regarding long-term THC use, and she expressed understanding.  Will continue motivational interview.     Plan Continue lorazepam  1 mg daily as needed for insomnia, anxiety  Next appointment- 5/21 at 4 pm, video - on gabapentin  800 mg TID, nortriptyline  25 mg twice a day   Past trials of medication: duloxetine , Trazodone "visual changes. Mirtazapine "increase in appetite, Lyrica    The patient demonstrates the following risk factors for suicide: Chronic risk factors for suicide include: chronic pain. Acute risk factors for suicide include: loss (financial, interpersonal, professional). Protective factors for this patient include: positive social support, responsibility to others (children, family), coping skills, and hope for the future. Considering these factors, the overall suicide risk at this point appears to be low. Patient is appropriate for outpatient follow up.   A total of 35 minutes was spent on the following activities during the encounter date, which includes but is not limited to: preparing to see the patient (e.g., reviewing tests and records), obtaining and/or reviewing separately obtained history, performing a medically necessary examination or evaluation, counseling and educating the patient, family, or caregiver, ordering medications, tests, or procedures, referring and communicating with other healthcare professionals (when not reported separately), documenting clinical information in the electronic or paper health record, independently interpreting test or lab results and communicating these results to the family or caregiver, and coordinating care (when not reported separately).   Collaboration of Care: Collaboration of Care: Other reviewed notes in Epic  Patient/Guardian was advised Release of Information must be obtained prior to any record release in order to collaborate their care with  an outside provider. Patient/Guardian  was advised if they have not already done so to contact the registration department to sign all necessary forms in order for us  to release information regarding their care.   Consent: Patient/Guardian gives verbal consent for treatment and assignment of benefits for services provided during this visit. Patient/Guardian expressed understanding and agreed to proceed.    Todd Fossa, MD 05/11/2024, 5:04 PM

## 2024-05-10 ENCOUNTER — Ambulatory Visit (INDEPENDENT_AMBULATORY_CARE_PROVIDER_SITE_OTHER): Admitting: Licensed Clinical Social Worker

## 2024-05-10 DIAGNOSIS — F320 Major depressive disorder, single episode, mild: Secondary | ICD-10-CM | POA: Diagnosis not present

## 2024-05-10 DIAGNOSIS — F4322 Adjustment disorder with anxiety: Secondary | ICD-10-CM | POA: Diagnosis not present

## 2024-05-10 NOTE — Progress Notes (Signed)
 THERAPIST PROGRESS NOTE  Virtual Visit via Video Note  I connected with Ashley Brandt on 05/10/24 at  1:00 PM EDT by a video enabled telemedicine application and verified that I am speaking with the correct person using two identifiers.  Location: Patient: Address on file   Provider: [facility]   I discussed the limitations of evaluation and management by telemedicine and the availability of in person appointments. The patient expressed understanding and agreed to proceed.    I discussed the assessment and treatment plan with the patient. The patient was provided an opportunity to ask questions and all were answered. The patient agreed with the plan and demonstrated an understanding of the instructions.   The patient was advised to call back or seek an in-person evaluation if the symptoms worsen or if the condition fails to improve as anticipated.  I provided 59 minutes of non-face-to-face time during this encounter.   Marvin Slot, LCSW   Session Time: 1-1:59pm  Participation Level: Active  Behavioral Response: CasualAlertEuthymic  Type of Therapy: Individual Therapy  Treatment Goals addressed:  Problem: BH CCP Acute or Chronic Trauma Reaction         Dates: Start:  03/09/24           Disciplines: Interdisciplinary, Counselor, PROVIDER                        Goal: LTG: Elimination of maladaptive behaviors and thinking patterns which interfere with resolution of trauma as evidenced by self report     Dates: Start:  03/09/24    Expected End:  09/09/24       Disciplines: Interdisciplinary, PROVIDER                         Goal: LTG: Recall traumatic events without becoming overwhelmed with negative emotions     Dates: Start:  03/09/24    Expected End:  09/09/24       Disciplines: Interdisciplinary, PROVIDER                         Goal: STG: Pt reports she would like to learn how to set boundaries with her mother around religious beliefs     Dates: Start:   03/09/24    Expected End:  09/09/24       Disciplines: Interdisciplinary, PROVIDER                         Goal: LTG: "Address childhood trauma centered around religion."     Dates: Start:  03/09/24    Expected End:  09/09/24       Disciplines: Interdisciplinary, PROVIDER                         Goal: LTG - Misc 1     Dates: Start:  03/09/24    Expected End:  09/09/24       Description: "Address PTSD related to all my medical stuff."     Disciplines: Counselor         ProgressTowards Goals: Progressing  Interventions: Supportive and Other: EMDR  Summary: Ashley Brandt is a 53 y.o. female who presents with sxs to include but not limited to Change in energy/activity; Difficulty Concentrating; Fatigue; Hopelessness; Increase/decrease in appetite; Irritability; Tearfulness; Worthlessness; Restlessness; Sleep; Tension; Worrying; Irritability/anger; Detachment from others; Emotional numbing; Guilt/shame. Pt was oriented  times 5. Pt was cooperative and engaged. Pt denies SI/HI/AVH.   Patient shared a progress report on Spinal Cord Stimulator. The patient reports ongoing significant pain. She has surgery scheduled for June 27th and expresses hope regarding the upcoming procedure. Reflected on com;ications rleated to diagnosis of Complex Regional Pain Syndrome.   The patient mentions that she has been cutting her lorazepam  dosage in half and is coping through meditation and positive affirmations regarding self-acceptance. She identifies a tendency toward perfectionism and has set goals to be "softer on myself" and to accept both herself and her current situation.   She notes that negative feelings feel more restrictive and manifest in her abdomen, whereas she used to cope by running. She expresses confusion about how to handle her emotions since she cannot run at the moment.   The patient has worked with her clinician to create a target sequence plan addressing her negative belief of "I  am not enough." During her last session, she became tearful while reflecting on her efforts to reinforce the positive affirmation "I am lovable as I am." She also reflected on memories related to religious and medical trauma stemming from her amputation. Additionally, she contemplated the physical and relational implications of her surgical procedures. The patient shared feelings of inadequacy across various aspects of her life and frequently compares herself to who she was before the surgery.  For her next EMDR session, the patient expressed a desire to try out eye movement next session.   Suicidal/Homicidal: Nowithout intent/plan  Therapist Response: Clinician utilized active and supportive reflection to create a safe space for patient to process recent life events. Clinician assessed for current stressors, symptoms, safety since last session.  Continued EMDR treatment with patient by resourcing and identifying negative cognitions for her TSP.   Plan: Return again in 2 weeks.  Diagnosis: Adjustment disorder with anxious mood  Current mild episode of major depressive disorder, unspecified whether recurrent (HCC)   Collaboration of Care: AEB psychiatrist can access notes and cln. Will review psychiatrists' notes. Check in with the patient and will see LCSW per availability. Patient agreed with treatment recommendations.   Patient/Guardian was advised Release of Information must be obtained prior to any record release in order to collaborate their care with an outside provider. Patient/Guardian was advised if they have not already done so to contact the registration department to sign all necessary forms in order for us  to release information regarding their care.   Consent: Patient/Guardian gives verbal consent for treatment and assignment of benefits for services provided during this visit. Patient/Guardian expressed understanding and agreed to proceed.   Marvin Slot, LCSW 05/10/2024

## 2024-05-11 ENCOUNTER — Telehealth (INDEPENDENT_AMBULATORY_CARE_PROVIDER_SITE_OTHER): Admitting: Psychiatry

## 2024-05-11 ENCOUNTER — Encounter: Payer: Self-pay | Admitting: Psychiatry

## 2024-05-11 DIAGNOSIS — F4322 Adjustment disorder with anxiety: Secondary | ICD-10-CM | POA: Diagnosis not present

## 2024-05-11 DIAGNOSIS — G4700 Insomnia, unspecified: Secondary | ICD-10-CM

## 2024-05-11 MED ORDER — LORAZEPAM 1 MG PO TABS: 1.0000 mg | ORAL_TABLET | Freq: Every day | ORAL | 1 refills | Status: DC | PRN

## 2024-05-17 ENCOUNTER — Encounter: Payer: Self-pay | Admitting: Physical Medicine and Rehabilitation

## 2024-05-17 ENCOUNTER — Ambulatory Visit (INDEPENDENT_AMBULATORY_CARE_PROVIDER_SITE_OTHER): Admitting: Licensed Clinical Social Worker

## 2024-05-17 DIAGNOSIS — F4322 Adjustment disorder with anxiety: Secondary | ICD-10-CM | POA: Diagnosis not present

## 2024-05-17 DIAGNOSIS — F320 Major depressive disorder, single episode, mild: Secondary | ICD-10-CM | POA: Diagnosis not present

## 2024-05-17 NOTE — Progress Notes (Signed)
 THERAPIST PROGRESS NOTE  Virtual Visit via Video Note  I connected with Ashley Brandt on 05/17/24 at 11:00 AM EDT by a video enabled telemedicine application and verified that I am speaking with the correct person using two identifiers.  Location: Patient: Address on file  Provider: [facility]   I discussed the limitations of evaluation and management by telemedicine and the availability of in person appointments. The patient expressed understanding and agreed to proceed.    I discussed the assessment and treatment plan with the patient. The patient was provided an opportunity to ask questions and all were answered. The patient agreed with the plan and demonstrated an understanding of the instructions.   The patient was advised to call back or seek an in-person evaluation if the symptoms worsen or if the condition fails to improve as anticipated.  I provided 60 minutes of non-face-to-face time during this encounter.   Marvin Slot, LCSW   Session Time: 11-12pm  Participation Level: Active  Behavioral Response: CasualAlertEuthymic  Type of Therapy: Individual Therapy  Treatment Goals addressed:  Problem: BH CCP Acute or Chronic Trauma Reaction         Dates: Start:  03/09/24           Disciplines: Interdisciplinary, Counselor, PROVIDER                        Goal: LTG: Elimination of maladaptive behaviors and thinking patterns which interfere with resolution of trauma as evidenced by self report     Dates: Start:  03/09/24    Expected End:  09/09/24       Disciplines: Interdisciplinary, PROVIDER                         Goal: LTG: Recall traumatic events without becoming overwhelmed with negative emotions     Dates: Start:  03/09/24    Expected End:  09/09/24       Disciplines: Interdisciplinary, PROVIDER                         Goal: STG: Pt reports she would like to learn how to set boundaries with her mother around religious beliefs     Dates: Start:   03/09/24    Expected End:  09/09/24       Disciplines: Interdisciplinary, PROVIDER                         Goal: LTG: "Address childhood trauma centered around religion."     Dates: Start:  03/09/24    Expected End:  09/09/24       Disciplines: Interdisciplinary, PROVIDER                         Goal: LTG - Misc 1     Dates: Start:  03/09/24    Expected End:  09/09/24       Description: "Address PTSD related to all my medical stuff."     Disciplines: Counselor         ProgressTowards Goals: Progressing  Interventions: Supportive and Other: EMDR  Summary: Ashley Brandt is a 53 y.o. female who presents with sxs to include but not limited to Change in energy/activity; Difficulty Concentrating; Fatigue; Hopelessness; Increase/decrease in appetite; Irritability; Tearfulness; Worthlessness; Restlessness; Sleep; Tension; Worrying; Irritability/anger; Detachment from others; Emotional numbing; Guilt/shame. Pt was oriented times 5.  Pt was cooperative and engaged. Pt denies SI/HI/AVH.   Patient began EMDR by processing her [place] memory on her target sequence plan.  Patient decreased objective unit of distress from 4 to 0 for the negative belief "I am trapped."  Further instill the belief "I can survive."  Patient acknowledged controllable factors regarding her physical disability following an amputation.  Patient acknowledged the impact she feels her disability has had on personal relationships and feeling abandoned.  Clinician challenged patient to acknowledge further opportunities should she separate herself from unhealthy relationships as well as efforts she can make to break from personalizing others behaviors.  Suicidal/Homicidal: Nowithout intent/plan  Therapist Response: Clinician utilized active and supportive reflection to create a safe space for patient to process recent life events. Clinician assessed for current stressors, symptoms, safety since last session. Continued EMDR  treatment with patient by processing her TSP.   Plan: Return again in 2 weeks.  Diagnosis: Adjustment disorder with anxious mood  Current mild episode of major depressive disorder, unspecified whether recurrent (HCC)   Collaboration of Care:  [facility] psychiatrist can access notes and cln. Will review psychiatrists' notes. Check in with the patient and will see LCSW per availability. Patient agreed with treatment recommendations.   Patient/Guardian was advised Release of Information must be obtained prior to any record release in order to collaborate their care with an outside provider. Patient/Guardian was advised if they have not already done so to contact the registration department to sign all necessary forms in order for us  to release information regarding their care.   Consent: Patient/Guardian gives verbal consent for treatment and assignment of benefits for services provided during this visit. Patient/Guardian expressed understanding and agreed to proceed.   Marvin Slot, LCSW 05/17/2024

## 2024-05-23 ENCOUNTER — Encounter: Admitting: Physical Medicine and Rehabilitation

## 2024-05-24 ENCOUNTER — Encounter: Attending: Physical Medicine and Rehabilitation | Admitting: Physical Medicine and Rehabilitation

## 2024-05-24 ENCOUNTER — Ambulatory Visit (INDEPENDENT_AMBULATORY_CARE_PROVIDER_SITE_OTHER): Admitting: Licensed Clinical Social Worker

## 2024-05-24 DIAGNOSIS — F320 Major depressive disorder, single episode, mild: Secondary | ICD-10-CM | POA: Diagnosis not present

## 2024-05-24 DIAGNOSIS — G5771 Causalgia of right lower limb: Secondary | ICD-10-CM | POA: Insufficient documentation

## 2024-05-24 DIAGNOSIS — F4322 Adjustment disorder with anxiety: Secondary | ICD-10-CM | POA: Diagnosis not present

## 2024-05-24 NOTE — Progress Notes (Signed)
 Subjective:    Patient ID: Ashley Brandt, female    DOB: Aug 20, 1971, 53 y.o.   MRN: [id number]  HPI An audio/video tele-health visit is felt to be the most appropriate encounter for this patient at this time. This is a follow up tele-visit via phone. The patient is at home. MD is at office. Prior to scheduling this appointment, our staff discussed the limitations of evaluation and management by telemedicine and the availability of in-person appointments. The patient expressed understanding and agreed to proceed.   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  The patient states that she has 2 types of pain 1 that is on the bottom of the foot right around the area of the surgery the other area is more in the middle of the foot.  She really has few symptoms at the ankle or above.  She notes mild swelling in the right foot sometimes discoloration as well.  It is very hypersensitive to touch particularly on the plantar surface. 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  She was started on Lyrica  and had some side effects and did not wish to continue taking it.  The patient also had a behavioral health evaluation 04/02/2021 diagnosed with insomnia and adjustment disorder with mixed anxiety depressed mood.  She tried Ambien  and melatonin.  She has been on Cymbalta  30 mg up to 60 mg a day.  She is now on gabapentin  600 mg 3 times per day.  The patient has had a second opinion with orthopedic surgeon.  The patient has had an arterial Doppler on 04/23/2021 which was normal.  She is scheduling surgery at [facility] with a peripheral neurosurgeon with plans for resection of neuroma as well as wrapping the end of the neuroma with abdominal epidermis to prevent reforming  -she has always been active and started to run a lot more during the  pandemic. 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 She has had more pain and less function.   -she wanted a neurosurgeon to place her spinal cord stimulator  -she asks about compounding medication  CRPS has gotten considerably worse, she is currently 3 weeks post-op spinal cord stimulator placement, she does find it helps, but knows it will take time to get used it   -she currently feels a burning pain in her residual limb  -she had muscle reintigration  -has tried ketamine infusions before, she did not love doing them but she did feel that they helped, she is not a fan of tripping and she did feel that when she was coming out of the infusion  -sometimes she feels a burning pain in her foot as well  Pain interferes with sleep at night  -prior to amputation she has tried bisphosphonate infusions, lumbar sympathetic nerve block, gabapentin , nortriptyline      CLINICAL DATA:  Severe plantar foot pain since foot surgery 3-4 weeks ago for removal of Morton's neuroma (on 01/24/2021).   EXAM: MRI OF THE RIGHT FOREFOOT WITHOUT CONTRAST   TECHNIQUE: Multiplanar, multisequence MR imaging of the right forefoot was performed. No intravenous contrast was administered.   COMPARISON:  Office foot radiographs 11/12/2020. No previous MRI available.   FINDINGS: Bones/Joint/Cartilage   Mild degenerative changes of the  1st metatarsophalangeal joint with a small subchondral cyst in the 1st metatarsal head and a small joint effusion. No other significant arthropathic changes. There is no evidence of acute fracture, dislocation or avascular necrosis.   Ligaments   The Lisfranc ligament is intact. The collateral ligaments of the metatarsophalangeal joints appear intact.   Muscles and Tendons   There is focal susceptibility artifact within the plantar soft tissues at the level of the 2nd and 3rd metatarsal  necks, presumably postsurgical. There is mild surrounding soft tissue edema as well as trace fluid within the flexor tendon sheaths of the 2nd and 3rd digits. The interosseous muscles are mildly edematous. There is additional ill-defined low signal between the 2nd and 3rd metatarsal necks, also likely postsurgical. No drainable fluid collection.   Soft tissues   The plantar soft tissues are deformed by a capsule which was placed along the plantar aspect of the 3rd metatarsal head. As above, there is underlying soft tissue edema of and presumed postsurgical susceptibility artifact. No drainable fluid collection or soft tissue mass identified.   IMPRESSION: 1. Nonspecific soft tissue edema and susceptibility artifact in the plantar forefoot soft tissues, attributed to recent surgery. Possible mild flexor tenosynovitis of the 2nd and 3rd rays. 2. No drainable fluid collection. 3. First MTP joint degenerative changes and small effusion. No acute osseous findings.     Electronically Signed   By: Elmon Hagedorn M.D.   On: 04/08/2021 09:41 Pain Inventory Average Pain 7 Pain Right Now 5 My pain is sharp, burning, and stabbing  In the last 24 hours, has pain interfered with the following? General activity 7 Relation with others 8 Enjoyment of life 8 What TIME of day is your pain at its worst? evening Sleep (in general) Fair  Pain is worse with: walking, sitting, and standing Pain improves with: rest, pacing activities, and medication Relief from Meds: 6  walk without assistance how many minutes can you walk? 5-10 ability to climb steps?  yes do you drive?  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/21 I need assistance with the following:  meal prep, household duties, and shopping  trouble walking dizziness anxiety  New pt  New pt    Family History  Problem Relation Age of Onset   Depression Mother    Diabetes Mellitus II Mother     Heart Problems Father    Social History   Socioeconomic History   Marital status: Married    Spouse name: Not on file   Number of children: Not on file   Years of education: Not on file   Highest education level: Not on file  Occupational History   Not on file  Tobacco Use   Smoking status: Never   Smokeless tobacco: Never  Vaping Use   Vaping status: Never Used  Substance and Sexual Activity   Alcohol  use: Yes    Comment: social    Drug use: No   Sexual activity: Yes    Birth control/protection: Abstinence, None  Other Topics Concern   Not on file  Social History Narrative   Not on file   Social Drivers of Health   Financial Resource Strain: Low Risk  (11/02/2023)   Received from [facility]   Overall Financial Resource Strain (CARDIA)    Difficulty of Paying Living Expenses: Not hard at all  Food Insecurity: No Food Insecurity (11/02/2023)   Received from [facility]   Hunger Vital Sign  Worried About Programme researcher, broadcasting/film/video in the Last Year: Never true    Ran Out of Food in the Last Year: Never true  Transportation Needs: No Transportation Needs (11/02/2023)   Received from [facility] - Transportation    In the past 12 months, has lack of transportation kept you from medical appointments or from getting medications?: No    Lack of Transportation (Non-Medical): No  Physical Activity: Inactive (09/02/2021)   Received from [facility]   Exercise Vital Sign    Days of Exercise per Week: 0 days    Minutes of Exercise per Session: 0 min  Stress: Stress Concern Present (09/02/2021)   Received from [facility] - Occupational Stress Questionnaire    Feeling of Stress : Very much  Social Connections: Unknown (04/23/2022)   Received from [facility]   Social Network    Social Network: Not on file   Past Surgical  History:  Procedure Laterality Date   BREAST REDUCTION SURGERY  2006   CESAREAN SECTION     CESAREAN SECTION  2000   Past Medical History:  Diagnosis Date   Allergy    There were no vitals taken for this visit.  Opioid Risk Score:   Fall Risk Score:  `1  Depression screen PHQ 2/9     03/09/2024    1:11 PM 08/28/2023    9:56 AM 06/11/2021    2:53 PM 01/20/2016   11:27 AM  Depression screen PHQ 2/9  Decreased Interest  1 1 0  Down, Depressed, Hopeless  1 1 0  PHQ - 2 Score  2 2 0  Altered sleeping   1   Tired, decreased energy   1   Change in appetite   0   Feeling bad or failure about yourself    1   Trouble concentrating   1   Moving slowly or fidgety/restless   1   Suicidal thoughts   0   PHQ-9 Score   7   Difficult doing work/chores   Very difficult      Information is confidential and restricted. Go to Review Flowsheets to unlock data.     Review of Systems  Musculoskeletal:  Positive for gait problem.       Foot pain  Neurological:  Positive for dizziness.  All other systems reviewed and are negative.      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, vasomotor symptoms fairly mild -continue nortriptyline , change to night -d/c cymbalta   -continue gabapentin , refilled  -discussed that she had spinal cord stimulator placed by NSGY 3 weeks ago, discussed risks of tolerance over time  -discussed Scrambler therapy, recommended that she discuss this with her neurosurgeon as well, discussed cost of the therapy  -discussed that she is pursuing osseointegration in [place]  -discussed risks and benefits of ketamine troches and infusions  -discussed that she had a flare this weekend  -discussed of her her CRPS, her flare this weekend, ketamine troches versus infusions, discussed that she does not want to go to [facility] in [place], discussed Qutenza, discussed it as a  back-up option if surgery is ineffective  Prescribed Ketamine 20%, Baclofen  2%, Cyclobenzaprine 2%, Ketoprofen 10%, Gabapentin  20%, Bupivacaine  1%, Amitryptiline 5%, clonidine 0.2% compounded cream  Dispense  240 g  Apply 1-2 g to affected area 3-4 times per day   -Discussed Qutenza as an option for neuropathic pain control. Discussed that this is a capsaicin patch, stronger than capsaicin cream. Discussed that it is currently approved for diabetic peripheral neuropathy and post-herpetic neuralgia, but that it has also shown benefit in treating other forms of neuropathy. Provided patient with link to site to learn more about the patch: https://www.clark.biz/. Discussed that the patch would be placed in office and benefits usually last 3 months. Discussed that unintended exposure to capsaicin can cause severe irritation of eyes, mucous membranes, respiratory tract, and skin, but that Qutenza is a local treatment and does not have the systemic side effects of other nerve medications. Discussed that there may be pain, itching, erythema, and decreased sensory function associated with the application of Qutenza. Side effects usually subside within 1 week. A cold pack of analgesic medications can help with these side effects. Blood pressure can also be increased due to pain associated with administration of the patch.   -Provided with a pain relief journal and discussed that it contains foods and lifestyle tips to naturally help to improve pain. Discussed that these lifestyle strategies are also very good for health unlike some medications which can have negative side effects. Discussed that the act of keeping a journal can be therapeutic and helpful to realize patterns what helps to trigger and alleviate pain.     2) Anxiety: -referred to psychiatry -prescribed buspar  -continue lorazepam   3) Insomnia: -continue CBD gummies -continue meditation -Try to go outside near sunrise -Get exercise during  the day.  -Turn off all devices an hour before bedtime.  -Teas that can benefit: chamomile, valerian root, Brahmi (Bacopa) -Can consider over the counter melatonin, magnesium, and/or L-theanine. Melatonin is an anti-oxidant with multiple health benefits. Magnesium is involved in greater than 300 enzymatic reactions in the body and most of us  are deficient as our soil is often depleted. There are 7 different types of magnesium- Bioptemizer's is a supplement with all 7 types, and each has unique benefits. Magnesium can also help with constipation and anxiety.  -Pistachios naturally increase the production of melatonin -Cozy Earth bamboo bed sheets are free from toxic chemicals.  -Tart cherry juice or a tart cherry supplement can improve sleep and soreness post-workout  4) PTSD: -discussed benefits of EMDR therapy with Hildegard Low   11 minutes spent in discussion of her her CRPS, her flare this weekend, ketamine troches versus infusions, discussed that she does not want to go to [facility] in [place], discussed Qutenza, discussed it as a back-up option if surgery is ineffective

## 2024-05-24 NOTE — Progress Notes (Signed)
 THERAPIST PROGRESS NOTE  Virtual Visit via Video Note  I connected with Ashley Brandt on 05/24/24 at  8:00 AM EDT by a video enabled telemedicine application and verified that I am speaking with the correct person using two identifiers.  Location: Patient: Address on file  Provider: [facility]   I discussed the limitations of evaluation and management by telemedicine and the availability of in person appointments. The patient expressed understanding and agreed to proceed.   I discussed the assessment and treatment plan with the patient. The patient was provided an opportunity to ask questions and all were answered. The patient agreed with the plan and demonstrated an understanding of the instructions.   The patient was advised to call back or seek an in-person evaluation if the symptoms worsen or if the condition fails to improve as anticipated.  I provided 57 minutes of non-face-to-face time during this encounter.   Marvin Slot, LCSW   Session Time: 8-8:57am  Participation Level: Active  Behavioral Response: CasualAlertEuthymic  Type of Therapy: Individual Therapy  Treatment Goals addressed:  Problem: BH CCP Acute or Chronic Trauma Reaction         Dates: Start:  03/09/24           Disciplines: Interdisciplinary, Counselor, PROVIDER                        Goal: LTG: Elimination of maladaptive behaviors and thinking patterns which interfere with resolution of trauma as evidenced by self report     Dates: Start:  03/09/24    Expected End:  09/09/24       Disciplines: Interdisciplinary, PROVIDER                         Goal: LTG: Recall traumatic events without becoming overwhelmed with negative emotions     Dates: Start:  03/09/24    Expected End:  09/09/24       Disciplines: Interdisciplinary, PROVIDER                         Goal: STG: Pt reports she would like to learn how to set boundaries with her mother around religious beliefs     Dates: Start:   03/09/24    Expected End:  09/09/24       Disciplines: Interdisciplinary, PROVIDER                         Goal: LTG: "Address childhood trauma centered around religion."     Dates: Start:  03/09/24    Expected End:  09/09/24       Disciplines: Interdisciplinary, PROVIDER                         Goal: LTG - Misc 1     Dates: Start:  03/09/24    Expected End:  09/09/24       Description: "Address PTSD related to all my medical stuff."     Disciplines: Counselor         ProgressTowards Goals: Progressing  Interventions: Supportive and Other: EMDR  Summary: Ashley Brandt is a 53 y.o. female who presents with sxs to include but not limited to Change in energy/activity; Difficulty Concentrating; Fatigue; Hopelessness; Increase/decrease in appetite; Irritability; Tearfulness; Worthlessness; Restlessness; Sleep; Tension; Worrying; Irritability/anger; Detachment from others; Emotional numbing; Guilt/shame. Pt was oriented times 5.  Pt was cooperative and engaged. Pt denies SI/HI/AVH.   The patient utilized therapy sessions to process recent medication adjustments and cope with a pain flare-up. We discussed how she relies on her support system during this challenging time and acknowledged her spouse's progress and ability to provide support and kindness while she experiences discomfort. The patient continues to recognize feelings of guilt regarding the emotional burden she believes she places on others as they help her through this journey.  During the session, the patient engaged in EMDR therapy to process past memories related to acceptance from her friends. She successfully decreased her subjective units of distress from a score of 4 to 0. The patient affirmed that self-acceptance is her primary goal, embracing the belief that "I am likable as I am."  Suicidal/Homicidal: Nowithout intent/plan  Therapist Response: Clinician utilized active and supportive reflection to create a safe  space for patient to process recent life events. Clinician assessed for current stressors, symptoms, safety since last session. Continued EMDR treatment with patient by processing her TSP.   Plan: Return again in 2 weeks.  Diagnosis: Adjustment disorder with anxious mood  Current mild episode of major depressive disorder, unspecified whether recurrent (HCC)   Collaboration of Care: AEB psychiatrist can access notes and cln. Will review psychiatrists' notes. Check in with the patient and will see LCSW per availability. Patient agreed with treatment recommendations.   Patient/Guardian was advised Release of Information must be obtained prior to any record release in order to collaborate their care with an outside provider. Patient/Guardian was advised if they have not already done so to contact the registration department to sign all necessary forms in order for us  to release information regarding their care.   Consent: Patient/Guardian gives verbal consent for treatment and assignment of benefits for services provided during this visit. Patient/Guardian expressed understanding and agreed to proceed.   Marvin Slot, LCSW 05/24/2024

## 2024-05-27 ENCOUNTER — Other Ambulatory Visit: Payer: Self-pay | Admitting: Physical Medicine and Rehabilitation

## 2024-05-27 ENCOUNTER — Encounter: Admitting: Physical Medicine and Rehabilitation

## 2024-05-27 DIAGNOSIS — G5771 Causalgia of right lower limb: Secondary | ICD-10-CM

## 2024-05-27 MED ORDER — JOURNAVX 50 MG PO TABS: 1.0000 | ORAL_TABLET | Freq: Two times a day (BID) | ORAL | 0 refills | Status: AC | PRN

## 2024-05-30 ENCOUNTER — Ambulatory Visit (INDEPENDENT_AMBULATORY_CARE_PROVIDER_SITE_OTHER): Admitting: Licensed Clinical Social Worker

## 2024-05-30 DIAGNOSIS — G4700 Insomnia, unspecified: Secondary | ICD-10-CM

## 2024-05-30 DIAGNOSIS — F4322 Adjustment disorder with anxiety: Secondary | ICD-10-CM

## 2024-05-30 DIAGNOSIS — F320 Major depressive disorder, single episode, mild: Secondary | ICD-10-CM | POA: Diagnosis not present

## 2024-05-30 NOTE — Progress Notes (Signed)
 THERAPIST PROGRESS NOTE  Virtual Visit via Video Note  I connected with Ashley Brandt on 05/30/24 at  2:00 PM EDT by a video enabled telemedicine application and verified that I am speaking with the correct person using two identifiers.  Location: Patient: Address on file  Provider: [facility]   I discussed the limitations of evaluation and management by telemedicine and the availability of in person appointments. The patient expressed understanding and agreed to proceed.  I discussed the assessment and treatment plan with the patient. The patient was provided an opportunity to ask questions and all were answered. The patient agreed with the plan and demonstrated an understanding of the instructions.   The patient was advised to call back or seek an in-person evaluation if the symptoms worsen or if the condition fails to improve as anticipated.  I provided 58 minutes of non-face-to-face time during this encounter.   Marvin Slot, LCSW   Session Time: 2-2:58pm  Participation Level: Active  Behavioral Response: CasualAlertEuthymic  Type of Therapy: Individual Therapy  Treatment Goals addressed:  Problem: BH CCP Acute or Chronic Trauma Reaction         Dates: Start:  03/09/24           Disciplines: Interdisciplinary, Counselor, PROVIDER                        Goal: LTG: Elimination of maladaptive behaviors and thinking patterns which interfere with resolution of trauma as evidenced by self report     Dates: Start:  03/09/24    Expected End:  09/09/24       Disciplines: Interdisciplinary, PROVIDER                         Goal: LTG: Recall traumatic events without becoming overwhelmed with negative emotions     Dates: Start:  03/09/24    Expected End:  09/09/24       Disciplines: Interdisciplinary, PROVIDER                         Goal: STG: Pt reports she would like to learn how to set boundaries with her mother around religious beliefs     Dates: Start:   03/09/24    Expected End:  09/09/24       Disciplines: Interdisciplinary, PROVIDER                         Goal: LTG: "Address childhood trauma centered around religion."     Dates: Start:  03/09/24    Expected End:  09/09/24       Disciplines: Interdisciplinary, PROVIDER                         Goal: LTG - Misc 1     Dates: Start:  03/09/24    Expected End:  09/09/24       Description: "Address PTSD related to all my medical stuff."     Disciplines: Counselor           ProgressTowards Goals: Progressing  Interventions: Supportive and Other: EMDR  Summary: Ashley Brandt is a 53 y.o. female who presents with sxs to include but not limited to Change in energy/activity; Difficulty Concentrating; Fatigue; Hopelessness; Increase/decrease in appetite; Irritability; Tearfulness; Worthlessness; Restlessness; Sleep; Tension; Worrying; Irritability/anger; Detachment from others; Emotional numbing; Guilt/shame. Pt was oriented times  5. Pt was cooperative and engaged. Pt denies SI/HI/AVH.    Patient reports she is housebound due to chronic pain. She feels hopeless after looking into alternative treatment options and is feeling overwhelmed. There is anxiety surrounding an upcoming procedure. She celebrated driving for the first time in a long time while living with [facility].  Patient continued EMDR by focusing on chronic pain protocol.  Patient identifies unhelpful cognition of I cannot connect as a result of her chronic pain.  Should and patient work together to work towards desensitizing her pain and decreasing the severity/presentation of chronic pain.  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  Suicidal/Homicidal: Nowithout  intent/plan  Therapist Response: Clinician utilized active and supportive reflection to create a safe space for patient to process recent life events. Clinician assessed for current stressors, symptoms, safety since last session. Continued EMDR treatment.   Plan: Return again in 2 weeks.  Diagnosis: Adjustment disorder with anxious mood  Current mild episode of major depressive disorder, unspecified whether recurrent (HCC)  Insomnia, unspecified type   Collaboration of Care: AEB psychiatrist can access notes and cln. Will review psychiatrists' notes. Check in with the patient and will see LCSW per availability. Patient agreed with treatment recommendations.   Patient/Guardian was advised Release of Information must be obtained prior to any record release in order to collaborate their care with an outside provider. Patient/Guardian was advised if they have not already done so to contact the registration department to sign all necessary forms in order for us  to release information regarding their care.   Consent: Patient/Guardian gives verbal consent for treatment and assignment of benefits for services provided during this visit. Patient/Guardian expressed understanding and agreed to proceed.   Marvin Slot, LCSW 05/30/2024

## 2024-06-01 ENCOUNTER — Encounter: Payer: Self-pay | Admitting: Physical Medicine and Rehabilitation

## 2024-06-03 ENCOUNTER — Other Ambulatory Visit: Payer: Self-pay | Admitting: Physical Medicine and Rehabilitation

## 2024-06-03 DIAGNOSIS — Z789 Other specified health status: Secondary | ICD-10-CM

## 2024-06-07 DIAGNOSIS — Z89511 Acquired absence of right leg below knee: Secondary | ICD-10-CM | POA: Diagnosis not present

## 2024-06-07 DIAGNOSIS — G90521 Complex regional pain syndrome I of right lower limb: Secondary | ICD-10-CM | POA: Diagnosis not present

## 2024-06-07 DIAGNOSIS — Z7189 Other specified counseling: Secondary | ICD-10-CM | POA: Diagnosis not present

## 2024-06-07 DIAGNOSIS — Z01818 Encounter for other preprocedural examination: Secondary | ICD-10-CM | POA: Diagnosis not present

## 2024-06-07 DIAGNOSIS — G894 Chronic pain syndrome: Secondary | ICD-10-CM | POA: Diagnosis not present

## 2024-06-07 DIAGNOSIS — M89761 Major osseous defect, right lower leg: Secondary | ICD-10-CM | POA: Diagnosis not present

## 2024-06-07 DIAGNOSIS — G5771 Causalgia of right lower limb: Secondary | ICD-10-CM | POA: Diagnosis not present

## 2024-06-13 DIAGNOSIS — G546 Phantom limb syndrome with pain: Secondary | ICD-10-CM | POA: Diagnosis not present

## 2024-06-13 DIAGNOSIS — Z9689 Presence of other specified functional implants: Secondary | ICD-10-CM | POA: Diagnosis not present

## 2024-06-13 DIAGNOSIS — G90521 Complex regional pain syndrome I of right lower limb: Secondary | ICD-10-CM | POA: Diagnosis not present

## 2024-06-14 ENCOUNTER — Ambulatory Visit (INDEPENDENT_AMBULATORY_CARE_PROVIDER_SITE_OTHER): Admitting: Licensed Clinical Social Worker

## 2024-06-14 DIAGNOSIS — F320 Major depressive disorder, single episode, mild: Secondary | ICD-10-CM

## 2024-06-14 DIAGNOSIS — F4322 Adjustment disorder with anxiety: Secondary | ICD-10-CM

## 2024-06-14 DIAGNOSIS — G4700 Insomnia, unspecified: Secondary | ICD-10-CM

## 2024-06-14 NOTE — Progress Notes (Signed)
 THERAPIST PROGRESS NOTE  Virtual Visit via Video Note  I connected with Ashley Brandt on 06/14/24 at 10:00 AM EDT by a video enabled telemedicine application and verified that I am speaking with the correct person using two identifiers.  Location: Patient: Address on file  Provider: [facility]   I discussed the limitations of evaluation and management by telemedicine and the availability of in person appointments. The patient expressed understanding and agreed to proceed.   I discussed the assessment and treatment plan with the patient. The patient was provided an opportunity to ask questions and all were answered. The patient agreed with the plan and demonstrated an understanding of the instructions.   The patient was advised to call back or seek an in-person evaluation if the symptoms worsen or if the condition fails to improve as anticipated.  I provided 58 minutes of non-face-to-face time during this encounter.   Ashley Brandt, Ashley Brandt   Session Time: 10-10:58am  Participation Level: Active  Behavioral Response: CasualAlertEuthymic  Type of Therapy: Individual Therapy  Treatment Goals addressed:  Active     Anxiety     LTG: Ashley Brandt Ashley score less than 5 on the Generalized Anxiety Disorder 7 Scale (GAD-7)      Start:  03/09/24    Expected End:  09/09/24         STG: Ashley Brandt Ashley reduce frequency of avoidant Brandt by 50% as evidenced by self-Brandt in therapy sessions     Start:  03/09/24    Expected End:  09/09/24         Work with patient individually to identify the major components of a recent episode of anxiety: physical symptoms, major thoughts and images, and major Brandt they experienced     Start:  03/09/24         Perform motivational interviewing regarding physical activity     Start:  03/09/24         Coping Skills      Start:  03/09/24       Ashley work with the Brandt using CBT/DBT techniques to help the Brandt verbalize an understanding  of the cognitive, physiological, and behavioral components of anxiety and its treatment. This Ashley be done by using worksheets, interactive activities, CBT/ABC thought logs, modeling, homework, role playing and journaling. Ashley Brandt to learn and implement coping skills that result in a reduction of anxiety and improve daily functioning per Brandt self-Brandt 3 out of 5 documented sessions.         BH CCP Acute or Chronic Brandt Reaction     LTG: Ashley Brandt     Start:  03/09/24    Expected End:  09/09/24         LTG: Recall traumatic events without becoming overwhelmed with negative emotions     Start:  03/09/24    Expected End:  09/09/24         STG: Brandt reports she would like to learn how to set boundaries with her mother around religious beliefs     Start:  03/09/24    Expected End:  09/09/24         LTG: Address childhood Brandt centered around religion.     Start:  03/09/24    Expected End:  09/09/24         LTG - Misc 1     Start:  03/09/24  Expected End:  09/09/24      Address PTSD related to all my medical stuff.       Provide Ashley Brandt with education on Brandt-oriented therapy     Start:  03/09/24         Cooperate with Brandt-focused psychotherapy techniques to reduce emotional reaction to the traumatic event      Start:  03/09/24         Teach Ashley Brandt coping strategies (e.g., writing down thoughts and feelings in a journal; taking deep, slow breaths; calling a support person to talk about memories) to deal with Brandt memories and sudden emotional reactions without becoming emotionally n     Start:  03/09/24         Educate Ashley Brandt on Brandt influenced cognitive distortions     Start:  03/09/24         Encourage Ashley Brandt to identify 2 Brandt related cognitive distortions     Start:  03/09/24            ProgressTowards  Goals: Progressing  Interventions: Supportive and Other: EMDR  Summary: Ashley Brandt is a 53 y.o. female who presents with sxs to include but not limited to Change in energy/activity; Difficulty Concentrating; Fatigue; Hopelessness; Increase/decrease in appetite; Irritability; Tearfulness; Worthlessness; Restlessness; Sleep; Tension; Worrying; Irritability/anger; Detachment from others; Emotional numbing; Guilt/shame. Brandt was oriented times 5. Brandt was cooperative and engaged. Brandt denies SI/HI/AVH.    Reflected on the upcoming procedure to address chronic pain. She expressed cautious optimism about meeting with pain management providers. Discussed ways she is advocating for her care during and after the procedure. Noted that her dependence on her Brandt is a barrier to setting personal boundaries due to her disability. Cln and patient identified the negative beilef I have no control.   After surgery, she intends to volunteer and prioritize friendships with women who share similar values. Cln assessed the values within her new friendships. She has a desire to pursue creativity in her life while also supporting loved ones dealing with substance dependency.   Clinician and patient continued EMDR by processing previous discord with in personal relationships as a result of the physical health of a loved one.  Patient reports her subjective units of distress score decreased from 3 to 0. Patient instill the belief I can control what I can working towards self acceptance.  Suicidal/Homicidal: Nowithout intent/plan  Therapist Response: Clinician utilized active and supportive reflection to create a safe space for patient to process recent life events. Clinician assessed for current stressors, symptoms, safety since last session. Continued EMDR treatment.  Worked with patient on reframing negative cognitions and instilling self acceptance.  Plan: Return again in 2 weeks.  Diagnosis: Adjustment  disorder with anxious mood  Current mild episode of major depressive disorder, unspecified whether recurrent (HCC)  Insomnia, unspecified type   Collaboration of Care: AEB psychiatrist can access notes and cln. Ashley review psychiatrists' notes. Check in with the patient and Ashley see Ashley Brandt per availability. Patient agreed with treatment recommendations.   Patient/Guardian was advised Release of Information must be obtained prior to any record release in order to collaborate their care with an outside provider. Patient/Guardian was advised if they have not already done so to contact the registration department to sign all necessary forms in order for us  to release information regarding their care.   Consent: Patient/Guardian gives verbal consent for treatment and assignment of benefits for services provided during this visit. Patient/Guardian expressed understanding and agreed to proceed.  Ashley Brandt, Ashley Brandt 06/14/2024

## 2024-06-16 ENCOUNTER — Ambulatory Visit: Admitting: Physical Medicine and Rehabilitation

## 2024-06-17 DIAGNOSIS — D500 Iron deficiency anemia secondary to blood loss (chronic): Secondary | ICD-10-CM | POA: Diagnosis not present

## 2024-06-17 DIAGNOSIS — R110 Nausea: Secondary | ICD-10-CM | POA: Diagnosis not present

## 2024-06-17 DIAGNOSIS — G8928 Other chronic postprocedural pain: Secondary | ICD-10-CM | POA: Diagnosis not present

## 2024-06-17 DIAGNOSIS — M89761 Major osseous defect, right lower leg: Secondary | ICD-10-CM | POA: Diagnosis not present

## 2024-06-17 DIAGNOSIS — G8918 Other acute postprocedural pain: Secondary | ICD-10-CM | POA: Diagnosis not present

## 2024-06-17 DIAGNOSIS — Y835 Amputation of limb(s) as the cause of abnormal reaction of the patient, or of later complication, without mention of misadventure at the time of the procedure: Secondary | ICD-10-CM | POA: Diagnosis not present

## 2024-06-17 DIAGNOSIS — R34 Anuria and oliguria: Secondary | ICD-10-CM | POA: Diagnosis not present

## 2024-06-17 DIAGNOSIS — Z9682 Presence of neurostimulator: Secondary | ICD-10-CM | POA: Diagnosis not present

## 2024-06-17 DIAGNOSIS — G5771 Causalgia of right lower limb: Secondary | ICD-10-CM | POA: Diagnosis not present

## 2024-06-17 DIAGNOSIS — K5903 Drug induced constipation: Secondary | ICD-10-CM | POA: Diagnosis not present

## 2024-06-17 DIAGNOSIS — G90521 Complex regional pain syndrome I of right lower limb: Secondary | ICD-10-CM | POA: Diagnosis not present

## 2024-06-17 DIAGNOSIS — M79671 Pain in right foot: Secondary | ICD-10-CM | POA: Diagnosis not present

## 2024-06-17 DIAGNOSIS — F419 Anxiety disorder, unspecified: Secondary | ICD-10-CM | POA: Diagnosis not present

## 2024-06-17 DIAGNOSIS — T402X5A Adverse effect of other opioids, initial encounter: Secondary | ICD-10-CM | POA: Diagnosis not present

## 2024-06-17 DIAGNOSIS — Z89511 Acquired absence of right leg below knee: Secondary | ICD-10-CM | POA: Diagnosis not present

## 2024-06-17 DIAGNOSIS — Z7982 Long term (current) use of aspirin: Secondary | ICD-10-CM | POA: Diagnosis not present

## 2024-06-17 DIAGNOSIS — G43909 Migraine, unspecified, not intractable, without status migrainosus: Secondary | ICD-10-CM | POA: Diagnosis not present

## 2024-06-17 DIAGNOSIS — Z8616 Personal history of COVID-19: Secondary | ICD-10-CM | POA: Diagnosis not present

## 2024-06-17 DIAGNOSIS — E7800 Pure hypercholesterolemia, unspecified: Secondary | ICD-10-CM | POA: Diagnosis not present

## 2024-06-27 DIAGNOSIS — T8789 Other complications of amputation stump: Secondary | ICD-10-CM | POA: Diagnosis not present

## 2024-06-27 DIAGNOSIS — Z8616 Personal history of COVID-19: Secondary | ICD-10-CM | POA: Diagnosis not present

## 2024-06-27 DIAGNOSIS — G5771 Causalgia of right lower limb: Secondary | ICD-10-CM | POA: Diagnosis not present

## 2024-06-27 DIAGNOSIS — Z9181 History of falling: Secondary | ICD-10-CM | POA: Diagnosis not present

## 2024-06-27 DIAGNOSIS — Z89511 Acquired absence of right leg below knee: Secondary | ICD-10-CM | POA: Diagnosis not present

## 2024-06-27 DIAGNOSIS — G894 Chronic pain syndrome: Secondary | ICD-10-CM | POA: Diagnosis not present

## 2024-06-27 DIAGNOSIS — F418 Other specified anxiety disorders: Secondary | ICD-10-CM | POA: Diagnosis not present

## 2024-06-27 DIAGNOSIS — E7800 Pure hypercholesterolemia, unspecified: Secondary | ICD-10-CM | POA: Diagnosis not present

## 2024-06-28 ENCOUNTER — Ambulatory Visit (INDEPENDENT_AMBULATORY_CARE_PROVIDER_SITE_OTHER): Admitting: Licensed Clinical Social Worker

## 2024-06-28 ENCOUNTER — Ambulatory Visit: Admitting: Licensed Clinical Social Worker

## 2024-06-28 DIAGNOSIS — F320 Major depressive disorder, single episode, mild: Secondary | ICD-10-CM | POA: Diagnosis not present

## 2024-06-28 DIAGNOSIS — G4700 Insomnia, unspecified: Secondary | ICD-10-CM

## 2024-06-28 DIAGNOSIS — F4322 Adjustment disorder with anxiety: Secondary | ICD-10-CM

## 2024-06-28 NOTE — Progress Notes (Signed)
 THERAPIST PROGRESS NOTE  Virtual Visit via Video Note  I connected with Ronalda Walpole Hendon on 06/28/24 at 11:00 AM EDT by a video enabled telemedicine application and verified that I am speaking with the correct person using two identifiers.  Location: Patient: Address on file  Provider: [facility]   I discussed the limitations of evaluation and management by telemedicine and the availability of in person appointments. The patient expressed understanding and agreed to proceed.  I discussed the assessment and treatment plan with the patient. The patient was provided an opportunity to ask questions and all were answered. The patient agreed with the plan and demonstrated an understanding of the instructions.   The patient was advised to call back or seek an in-person evaluation if the symptoms worsen or if the condition fails to improve as anticipated.  I provided 53 minutes of non-face-to-face time during this encounter.   [name], LCSW   Session Time: 11-11:53am  Participation Level: Active  Behavioral Response: CasualAlertEuthymic  Type of Therapy: Individual Therapy  Treatment Goals addressed:    ProgressTowards Goals: Progressing  Interventions: CBT, Strength-based, Supportive, and Reframing  Summary: JASLYNNE DAHAN is a 53 y.o. female who presents with sxs to include but not limited to Change in energy/activity; Difficulty Concentrating; Fatigue; Hopelessness; Increase/decrease in appetite; Irritability; Tearfulness; Worthlessness; Restlessness; Sleep; Tension; Worrying; Irritability/anger; Detachment from others; Emotional numbing; Guilt/shame. Pt was oriented times 5. Pt was cooperative and engaged. Pt denies SI/HI/AVH.    The patient presented to today's session feeling hopeful and optimistic following a recent procedure. She reported that her mood has been improving despite experiencing flare-ups of chronic pain. The patient shared that she has been  practicing mindfulness through meditation and gratitude, which has led to less severe feelings of hopelessness, difficulty concentrating, and excessive worry. Throughout the session, her mood remained optimistic; she was engaged, and her speech was soft and low in volume, maintaining an appropriate cadence.  The patient reflected on her ability to accept her current physical disability and reframe her perspective by focusing on her current abilities. She mentioned a recent encounter with a friend, where they connected on a deeper level. During this conversation, she shared her own trauma experiences and recognized that her value extends beyond her physical condition.  Suicidal/Homicidal: Nowithout intent/plan  Therapist Response: Clinician utilized active and supportive reflection to create a safe space for patient to process recent life events. Clinician assessed for current stressors, symptoms, safety since last session.   The clinician helped the patient process her feelings following her recent procedure. Together, they highlighted her growth and the shift in her perspective since beginning therapy. The clinician celebrated the patient's efforts to reframe negative thoughts and practiced gratitude during moments of hopelessness. Practiced Emotional Freedom Techniques (EFT) tapping to address feelings of hopelessness. The clinician explored the patient's feelings of hopelessness in detail and utilized Socratic questioning to engage her with past experiences that may trigger these feelings. Discussed ways the patient is finding meaning and purpose in her current situation.  The patient demonstrated marked improvement in her depressive and trauma symptoms. However, she expressed a desire to continue addressing her feelings of inadequacy stemming from her medical trauma. The patient is making progress in addressing her feelings of powerlessness related to her medical trauma by becoming an advocate for  herself through her various medical needs and procedures.  Plan: Moving forward, the clinician will continue to utilize Eye Movement Desensitization and Reprocessing (EMDR) therapy to help the patient  learn coping skills for managing her trauma symptoms and reframing negative thoughts related to her medical trauma history. For the upcoming week, the therapist has asked the patient to continue practicing gratitude. A follow-up session is recommended in two weeks.  Diagnosis: Adjustment disorder with anxious mood  Current mild episode of major depressive disorder, unspecified whether recurrent (HCC)  Insomnia, unspecified type    Collaboration of Care: AEB psychiatrist can access notes and cln. Will review psychiatrists' notes. Check in with the patient and will see LCSW per availability. Patient agreed with treatment recommendations.   Patient/Guardian was advised Release of Information must be obtained prior to any record release in order to collaborate their care with an outside provider. Patient/Guardian was advised if they have not already done so to contact the registration department to sign all necessary forms in order for us  to release information regarding their care.   Consent: Patient/Guardian gives verbal consent for treatment and assignment of benefits for services provided during this visit. Patient/Guardian expressed understanding and agreed to proceed.   [name], LCSW 06/28/2024

## 2024-06-29 DIAGNOSIS — Z89511 Acquired absence of right leg below knee: Secondary | ICD-10-CM | POA: Diagnosis not present

## 2024-06-29 DIAGNOSIS — T8789 Other complications of amputation stump: Secondary | ICD-10-CM | POA: Diagnosis not present

## 2024-06-29 DIAGNOSIS — E7800 Pure hypercholesterolemia, unspecified: Secondary | ICD-10-CM | POA: Diagnosis not present

## 2024-06-29 DIAGNOSIS — G894 Chronic pain syndrome: Secondary | ICD-10-CM | POA: Diagnosis not present

## 2024-06-29 DIAGNOSIS — Z8616 Personal history of COVID-19: Secondary | ICD-10-CM | POA: Diagnosis not present

## 2024-06-29 DIAGNOSIS — F418 Other specified anxiety disorders: Secondary | ICD-10-CM | POA: Diagnosis not present

## 2024-06-29 DIAGNOSIS — G5771 Causalgia of right lower limb: Secondary | ICD-10-CM | POA: Diagnosis not present

## 2024-06-29 DIAGNOSIS — Z9181 History of falling: Secondary | ICD-10-CM | POA: Diagnosis not present

## 2024-07-01 ENCOUNTER — Encounter: Payer: Self-pay | Admitting: Physical Medicine and Rehabilitation

## 2024-07-01 DIAGNOSIS — Z89511 Acquired absence of right leg below knee: Secondary | ICD-10-CM | POA: Diagnosis not present

## 2024-07-01 DIAGNOSIS — G894 Chronic pain syndrome: Secondary | ICD-10-CM | POA: Diagnosis not present

## 2024-07-01 DIAGNOSIS — E7800 Pure hypercholesterolemia, unspecified: Secondary | ICD-10-CM | POA: Diagnosis not present

## 2024-07-01 DIAGNOSIS — Z9181 History of falling: Secondary | ICD-10-CM | POA: Diagnosis not present

## 2024-07-01 DIAGNOSIS — T8789 Other complications of amputation stump: Secondary | ICD-10-CM | POA: Diagnosis not present

## 2024-07-01 DIAGNOSIS — G5771 Causalgia of right lower limb: Secondary | ICD-10-CM | POA: Diagnosis not present

## 2024-07-01 DIAGNOSIS — F418 Other specified anxiety disorders: Secondary | ICD-10-CM | POA: Diagnosis not present

## 2024-07-01 DIAGNOSIS — Z8616 Personal history of COVID-19: Secondary | ICD-10-CM | POA: Diagnosis not present

## 2024-07-02 NOTE — Progress Notes (Unsigned)
 Virtual Visit via Video Note  I connected with Ashley Brandt on 07/06/24 at  4:00 PM EDT by a video enabled telemedicine application and verified that I am speaking with the correct person using two identifiers.  Location: Patient: home Provider: home office Persons participated in the visit- patient, provider    I discussed the limitations of evaluation and management by telemedicine and the availability of in person appointments. The patient expressed understanding and agreed to proceed.   I discussed the assessment and treatment plan with the patient. The patient was provided an opportunity to ask questions and all were answered. The patient agreed with the plan and demonstrated an understanding of the instructions.   The patient was advised to call back or seek an in-person evaluation if the symptoms worsen or if the condition fails to improve as anticipated.    Katheren Sleet, MD    [facility] MD/PA/NP OP Progress Note  07/06/2024 5:21 PM Ashley Brandt  MRN:  [id number]  Chief Complaint:  Chief Complaint  Patient presents with   Follow-up   HPI:  - since the last visit, she underwent  REVISION AMPUTATION TIBIA INSERTION OSSEOINTEGRATION IMPLANT, TIBIA 06/17/2024   This is a follow-up appointment for adjustment disorder, insomnia.  She states that she is experiencing flareup of CRPS.  She would be seen by a pain management.  She will get scramble therapy on July 28.  She states that she is determined, feels more hopeful than ever.  Although the pain is limiting, things are going well.  She enjoyed July 4, the day after discharge.  Her friend and her son visited her.  She was able to keep the pain in the background.  She is reading the book, mind your body.  She is also journaling, and is doing meditation for self compassion.  She wonders that she needs to get through the hard part.  She reports experiencing emotions such as grief and the rage.  She states that she never  grieved the loss of her father.  She has been running away from scary feelings.  However, she feels like she needs to uncover, and process rather than going to the easy part.  She talks about her upbringing of the close relationship with her mother, yet who did not understand her as a sensitive person.  Although she feels anger towards her, she feels compassionate.  Provided psychoeducation regarding acceptance and commitment therapy.  She also talks about her anxiety against alcohol .  She talks about her 2 friends, who moved to [place]. They got together, and she felt the alcohol  use was getting out of control.  Her husband ended up being brought to the ED after the accident while riding a scooter.  She was upset about this and she viewed herself as not the fun one.  She now reflects that she does not need to control the other people.  She tends to sleep late around midnight, and sleeps through 9 AM.  Although she wishes to sleep sooner, it is difficult as CRPS pain is the worst lately.  She does not use any delta 8 or 9, and she continues to take clonazepam for insomnia.  She agrees with the plans as outlined below.   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 She considers her mother to be the best example of a [place]. She describes her childhood as generally positive, though there were challenges related to religion. Growing up, her family  attended a fundamentalist church, and she recalls feeling conflicted during that time. She moved to [place] for her husband work,    Visit Diagnosis:    ICD-10-CM   1. Adjustment disorder with anxious mood  F43.22     2. Insomnia, unspecified type  G47.00       Past Psychiatric History: Please see initial evaluation for full details. I have reviewed the history. No updates at this time.     Past Medical History:  Past Medical History:  Diagnosis Date   Allergy     Past Surgical History:  Procedure Laterality Date   BREAST REDUCTION SURGERY  2006   CESAREAN SECTION     CESAREAN SECTION  2000    Family Psychiatric History: Please see initial evaluation for full details. I have reviewed the history. No updates at this time.     Family History:  Family History  Problem Relation Age of Onset   Depression Mother    Diabetes Mellitus II Mother    Heart Problems Father     Social History:  Social History   Socioeconomic History   Marital status: Married    Spouse name: Not on file   Number of children: Not on file   Years of education: Not on file   Highest education level: Not on file  Occupational History   Not on file  Tobacco Use   Smoking status: Never   Smokeless tobacco: Never  Vaping Use   Vaping status: Never Used  Substance and Sexual Activity   Alcohol  use: Yes    Comment: social    Drug use: No   Sexual activity: Yes    Birth control/protection: Abstinence, None  Other Topics Concern   Not on file  Social History Narrative   Not on file   Social Drivers of Health   Financial Resource Strain: Low Risk  (11/02/2023)   Received from [facility]   Overall Financial Resource Strain (CARDIA)    Difficulty of Paying Living Expenses: Not hard at all  Food Insecurity: No Food Insecurity (06/20/2024)   Received from [facility]   Food Insecurity    Within the past 12 months, the food you bought just didn't last and you  didn't have money to get more.: Never true  Transportation Needs: No Transportation Needs (06/20/2024)   Received from [facility]   Transportation    In the past 12 months, has the lack of transportation kept you from medical appointments, meetings, work, or from getting things needed for daily living?: No  Physical Activity: Inactive (09/02/2021)   Received from [facility]   Exercise Vital Sign    On average, how many days per week do you engage in moderate to strenuous exercise (like a brisk walk)?: 0 days    On average, how many minutes do you engage in exercise at  this level?: 0 min  Stress: Stress Concern Present (09/02/2021)   Received from [facility] - Occupational Stress Questionnaire    Feeling of Stress : Very much  Social Connections: Unknown (04/23/2022)   [facility] from [facility]   Social Network    Social Network: Not on file    Allergies:  Allergies  Allergen Reactions   Dust Mite Mixed Allergen Ext [Mite (D. Farinae)]     Metabolic Disorder Labs: No results found for: HGBA1C, MPG No results found for: PROLACTIN No results found for: CHOL, TRIG, HDL, CHOLHDL, VLDL, LDLCALC No results found for: TSH  Therapeutic Level Labs: No results found for: LITHIUM No results found for: VALPROATE No results found for: CBMZ  Current Medications: Current Outpatient Medications  Medication Sig Dispense Refill   Ascorbic Acid (VITAMIN C) 1000 MG tablet Take 1,000 mg by mouth daily.     baclofen  (LIORESAL ) 10 MG tablet Take 1 tablet (10 mg total) by mouth 2 (two) times daily as needed for muscle spasms. 180 each 3   busPIRone  (BUSPAR ) 5 MG tablet Take 1 tablet (5 mg total) by mouth 3 (three) times daily as needed. 90 tablet 3   cholecalciferol (VITAMIN D3) 25 MCG (1000 UNIT) tablet Take 5,000 Units by mouth daily.     gabapentin  (NEURONTIN ) 100 MG capsule Take 1 capsule (100 mg total)  by mouth 3 (three) times daily. 90 capsule 2   gabapentin  (NEURONTIN ) 800 MG tablet Take 1 tablet (800 mg total) by mouth 3 (three) times daily. 270 tablet 3   LORazepam  (ATIVAN ) 1 MG tablet Take 1 tablet (1 mg total) by mouth daily as needed for anxiety. 30 tablet 1   magnesium 30 MG tablet Take 30 mg by mouth 2 (two) times daily. 120mg      Multiple Vitamin (MULTIVITAMIN) tablet Take 1 tablet by mouth daily.     nortriptyline  (PAMELOR ) 25 MG capsule Take 1 capsule (25 mg total) by mouth 2 (two) times daily. 180 capsule 3   Omega-3 Fatty Acids (FISH OIL) 1000 MG CAPS Take 3,600 mg by mouth daily.     Suzetrigine  (JOURNAVX ) 50 MG TABS Take 1 tablet by mouth 2 (two) times daily as needed. 60 tablet 0   vitamin B-12 (CYANOCOBALAMIN) 1000 MCG tablet Take 1,000 mcg by mouth daily. 500 mcg     No current facility-administered medications for this visit.     Musculoskeletal: Strength & Muscle Tone: N/A Gait & Station: N/A Patient leans: N/A  Psychiatric Specialty Exam: Review of Systems  Psychiatric/Behavioral:  Positive for sleep disturbance. Negative for agitation, behavioral problems, confusion, decreased concentration, dysphoric mood, hallucinations, self-injury and suicidal ideas. The patient is nervous/anxious. The patient is not hyperactive.   All other systems reviewed and are negative.   There were no vitals taken for this visit.There is no height or weight on file to calculate BMI.  General Appearance: Well Groomed  Eye Contact:  Good  Speech:  Clear and Coherent  Volume:  Normal  Mood:  in pain  Affect:  Appropriate, Congruent, and Full Range  Thought Process:  Coherent  Orientation:  Full (Time, Place, and Person)  Thought Content: Logical   Suicidal Thoughts:  No  Homicidal Thoughts:  No  Memory:  Immediate;   Good  Judgement:  Good  Insight:  Good  Psychomotor Activity:  Normal  Concentration:  Concentration: Good and Attention Span: Good  Recall:  Good  Fund of  Knowledge: Good  Language: Good  Akathisia:  No  Handed:  Right  AIMS (if indicated): not done  Assets:  Communication Skills Desire for Improvement  ADL's:  Intact  Cognition: WNL  Sleep:  Poor   Screenings: GAD-7    Flowsheet Row Counselor from 03/09/2024 in [facility]  Total GAD-7 Score 11   PHQ2-9    Flowsheet Row Counselor from 03/09/2024 in [facility] Visit from 08/28/2023 in [facility] Visit from 06/11/2021 in [facility] Office Visit from 01/20/2016 in Primary Care at [facility] Total Score 6 2 2  0  PHQ-9 Total Score 9 -- 7 --   Flowsheet Row Counselor from 03/09/2024 in [facility] ED from 03[facility]22/2023 in [facility] Emergency Department at [facility] UC from 06/17/2022 in [facility] at [facility] RISK CATEGORY No Risk No Risk No Risk     Assessment and Plan:  MOLLEE NEER is a 53 y.o. 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   1. Adjustment disorder with anxious mood She reports health anxiety, influenced by her father's death in his 6s and her mother's history of diabetes. Psychologically, she describes experiencing internal conflict related to her upbringing in a fundamentalist church.  History: no prior mental health history prior to BKA.  She is more hopeful with the upcoming scramble therapy, pain management, although she reports difficulty due to some flareup of CRPS on today's evaluation.  She reports good benefit from EMDR, and is interested in ACT.  Psychoeducation was provided regarding the nature and goals of this treatment.  She is willing to discuss this further with Ms. Perkins to explore  pursuing this treatment. Given her strong willingness to pursue therapy, pharmacological treatment will not be initiated at this time especially given she is already on nortriptyline . Noted that although she may benefit from prazosin, she tends to have low blood pressure, which precludes its use.    2. Insomnia, unspecified type - UDS positive for cannabinoid, 11/2023. On Sound Asleep, which contains L-Theanine 100mg , Full Spectrum Hemp Extract (from hemp extract aerial parts) 58mg , Cannabidiol (CBD) (hemp extract aerial parts) 50mg , THC (from hemp extract aerial parts) 5mg , Additional Minor Cannabinoids(from hemp extract aerial parts) 3mg , Melatonin 3mg    Slightly worsening in the setting of flareup of pain.  She has been abstinent from delta 9.  Will continue current dose of clonazepam to target insomnia. She is aware of the potential long-term risk of dependence, tolerance, and risk of respiratory suppression with concomitant use of opioid, gabapentin .  She has demonstrated the ability to discontinue its use when not needed.     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 we discussed concerns regarding long-term THC use, and she expressed understanding.  Will continue motivational interview.     Plan Continue lorazepam  1 mg daily as needed for insomnia, anxiety  Next appointment- 9/10 at 4 pm, video - on gabapentin  800 mg TID, nortriptyline  25 mg twice a day   Past trials of medication: duloxetine , Mirtazapine (increase in appetite), trazodone (visual changes).  Lyrica    The patient demonstrates the following risk factors for suicide: Chronic risk factors for suicide include: chronic pain. Acute risk factors for suicide include:  loss (financial, interpersonal, professional). Protective factors for this patient include: positive social support, responsibility to others (children, family), coping skills, and hope for the future. Considering these factors, the overall suicide risk at  this point appears to be low. Patient is appropriate for outpatient follow up.   A total of 30 minutes was spent on the following activities during the encounter date, which includes but is not limited to: preparing to see the patient (e.g., reviewing tests and records), obtaining and/or reviewing separately obtained history, performing a medically necessary examination or evaluation, counseling and educating the patient, family, or caregiver, ordering medications, tests, or procedures, referring and communicating with other healthcare professionals (when not reported separately), documenting clinical information in the electronic or paper health record, independently interpreting test or lab results and communicating these results to the family or caregiver, and coordinating care (when not reported separately).     Collaboration of Care: Collaboration of Care: Other reviewed notes in Epic  Patient/Guardian was advised Release of Information must be obtained prior to any record release in order to collaborate their care with an outside provider. Patient/Guardian was advised if they have not already done so to contact the registration department to sign all necessary forms in order for us  to release information regarding their care.   Consent: Patient/Guardian gives verbal consent for treatment and assignment of benefits for services provided during this visit. Patient/Guardian expressed understanding and agreed to proceed.    Katheren Sleet, MD 07/06/2024, 5:21 PM

## 2024-07-04 ENCOUNTER — Other Ambulatory Visit: Payer: Self-pay | Admitting: Physical Medicine and Rehabilitation

## 2024-07-04 MED ORDER — GABAPENTIN 100 MG PO CAPS: 100.0000 mg | ORAL_CAPSULE | Freq: Three times a day (TID) | ORAL | 2 refills | Status: AC

## 2024-07-05 DIAGNOSIS — Z89511 Acquired absence of right leg below knee: Secondary | ICD-10-CM | POA: Diagnosis not present

## 2024-07-05 DIAGNOSIS — Z9181 History of falling: Secondary | ICD-10-CM | POA: Diagnosis not present

## 2024-07-05 DIAGNOSIS — Z8616 Personal history of COVID-19: Secondary | ICD-10-CM | POA: Diagnosis not present

## 2024-07-05 DIAGNOSIS — G5771 Causalgia of right lower limb: Secondary | ICD-10-CM | POA: Diagnosis not present

## 2024-07-05 DIAGNOSIS — F418 Other specified anxiety disorders: Secondary | ICD-10-CM | POA: Diagnosis not present

## 2024-07-05 DIAGNOSIS — T8789 Other complications of amputation stump: Secondary | ICD-10-CM | POA: Diagnosis not present

## 2024-07-05 DIAGNOSIS — G894 Chronic pain syndrome: Secondary | ICD-10-CM | POA: Diagnosis not present

## 2024-07-05 DIAGNOSIS — E7800 Pure hypercholesterolemia, unspecified: Secondary | ICD-10-CM | POA: Diagnosis not present

## 2024-07-06 ENCOUNTER — Encounter: Payer: Self-pay | Admitting: Psychiatry

## 2024-07-06 ENCOUNTER — Encounter (HOSPITAL_BASED_OUTPATIENT_CLINIC_OR_DEPARTMENT_OTHER): Attending: Internal Medicine | Admitting: Internal Medicine

## 2024-07-06 ENCOUNTER — Telehealth (INDEPENDENT_AMBULATORY_CARE_PROVIDER_SITE_OTHER): Admitting: Psychiatry

## 2024-07-06 DIAGNOSIS — F4322 Adjustment disorder with anxiety: Secondary | ICD-10-CM

## 2024-07-06 DIAGNOSIS — Z89511 Acquired absence of right leg below knee: Secondary | ICD-10-CM | POA: Diagnosis not present

## 2024-07-06 DIAGNOSIS — G4700 Insomnia, unspecified: Secondary | ICD-10-CM

## 2024-07-06 DIAGNOSIS — G5771 Causalgia of right lower limb: Secondary | ICD-10-CM | POA: Insufficient documentation

## 2024-07-06 NOTE — Patient Instructions (Signed)
 Continue lorazepam  1 mg daily as needed for insomnia, anxiety  Next appointment- 9/10 at 4 pm

## 2024-07-07 DIAGNOSIS — G90521 Complex regional pain syndrome I of right lower limb: Secondary | ICD-10-CM | POA: Diagnosis not present

## 2024-07-08 DIAGNOSIS — G894 Chronic pain syndrome: Secondary | ICD-10-CM | POA: Diagnosis not present

## 2024-07-08 DIAGNOSIS — T8789 Other complications of amputation stump: Secondary | ICD-10-CM | POA: Diagnosis not present

## 2024-07-08 DIAGNOSIS — G5771 Causalgia of right lower limb: Secondary | ICD-10-CM | POA: Diagnosis not present

## 2024-07-08 DIAGNOSIS — F418 Other specified anxiety disorders: Secondary | ICD-10-CM | POA: Diagnosis not present

## 2024-07-08 DIAGNOSIS — Z9181 History of falling: Secondary | ICD-10-CM | POA: Diagnosis not present

## 2024-07-08 DIAGNOSIS — E7800 Pure hypercholesterolemia, unspecified: Secondary | ICD-10-CM | POA: Diagnosis not present

## 2024-07-08 DIAGNOSIS — Z8616 Personal history of COVID-19: Secondary | ICD-10-CM | POA: Diagnosis not present

## 2024-07-08 DIAGNOSIS — Z89511 Acquired absence of right leg below knee: Secondary | ICD-10-CM | POA: Diagnosis not present

## 2024-07-11 ENCOUNTER — Ambulatory Visit
Admission: RE | Admit: 2024-07-11 | Discharge: 2024-07-11 | Disposition: A | Discharge status: Home / Self Care | Source: Ambulatory Visit | Attending: Orthopedic Surgery | Admitting: Orthopedic Surgery

## 2024-07-11 ENCOUNTER — Other Ambulatory Visit: Payer: Self-pay | Admitting: Orthopedic Surgery

## 2024-07-11 DIAGNOSIS — Z9713 Presence of artificial right leg (complete) (partial): Secondary | ICD-10-CM | POA: Diagnosis not present

## 2024-07-11 DIAGNOSIS — Z89511 Acquired absence of right leg below knee: Secondary | ICD-10-CM

## 2024-07-12 ENCOUNTER — Encounter: Attending: Physical Medicine and Rehabilitation | Admitting: Physical Medicine and Rehabilitation

## 2024-07-12 ENCOUNTER — Ambulatory Visit (INDEPENDENT_AMBULATORY_CARE_PROVIDER_SITE_OTHER): Admitting: Licensed Clinical Social Worker

## 2024-07-12 DIAGNOSIS — F320 Major depressive disorder, single episode, mild: Secondary | ICD-10-CM | POA: Diagnosis not present

## 2024-07-12 DIAGNOSIS — F4322 Adjustment disorder with anxiety: Secondary | ICD-10-CM

## 2024-07-12 DIAGNOSIS — Z89511 Acquired absence of right leg below knee: Secondary | ICD-10-CM | POA: Diagnosis not present

## 2024-07-12 DIAGNOSIS — Z8616 Personal history of COVID-19: Secondary | ICD-10-CM | POA: Diagnosis not present

## 2024-07-12 DIAGNOSIS — E7800 Pure hypercholesterolemia, unspecified: Secondary | ICD-10-CM | POA: Diagnosis not present

## 2024-07-12 DIAGNOSIS — G5771 Causalgia of right lower limb: Secondary | ICD-10-CM | POA: Diagnosis not present

## 2024-07-12 DIAGNOSIS — F418 Other specified anxiety disorders: Secondary | ICD-10-CM | POA: Diagnosis not present

## 2024-07-12 DIAGNOSIS — Z9181 History of falling: Secondary | ICD-10-CM | POA: Diagnosis not present

## 2024-07-12 DIAGNOSIS — T8789 Other complications of amputation stump: Secondary | ICD-10-CM | POA: Diagnosis not present

## 2024-07-12 DIAGNOSIS — G894 Chronic pain syndrome: Secondary | ICD-10-CM | POA: Diagnosis not present

## 2024-07-12 NOTE — Progress Notes (Signed)
 Subjective:    Patient ID: Ashley Brandt, female    DOB: 04/08/1971, 53 y.o.   MRN: [id number]  HPI An audio/video tele-health visit is felt to be the most appropriate encounter for this patient at this time. This is a follow up tele-visit via phone. The patient is at home. MD is at office. Prior to scheduling this appointment, our staff discussed the limitations of evaluation and management by telemedicine and the availability of in-person appointments. The patient expressed understanding and agreed to proceed.  CC: RIght foot /toe pain  62) CRPS 53 year old female referred by her primary care physician for the evaluation of right foot and toe pain.  The patient states that she has 2 types of pain 1 that is on the bottom of the foot right around the area of the surgery the other area is more in the middle of the foot.  She really has few symptoms at the ankle or above.  She notes mild swelling in the right foot sometimes discoloration as well.  It is very hypersensitive to touch particularly on the plantar surface. 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  She was started on Lyrica  and had some side effects and did not wish to continue taking it.  The patient also had a behavioral health evaluation 04/02/2021 diagnosed with insomnia and adjustment disorder with mixed anxiety depressed mood.  She tried Ambien  and melatonin.  She has been on Cymbalta  30 mg up to 60 mg a day.  She is now on gabapentin  600 mg 3 times per day.  The patient has had a second opinion with orthopedic surgeon.  The patient has had an arterial Doppler on 04/23/2021 which was normal.  She is scheduling surgery at [facility] with a peripheral neurosurgeon with plans for resection of neuroma as well as wrapping the end of the neuroma with abdominal epidermis to prevent reforming  -she has always been active and started to run a lot more during the  pandemic. 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 She has had more pain and less function.   -she wanted a neurosurgeon to place her spinal cord stimulator  -she asks about compounding medication  CRPS has gotten considerably worse, she is currently 3 weeks post-op spinal cord stimulator placement, she does find it helps, but knows it will take time to get used it   -she currently feels a burning pain in her residual limb  -she had muscle reintigration  -has tried ketamine infusions before, she did not love doing them but she did feel that they helped, she is not a fan of tripping and she did feel that when she was coming out of the infusion  -sometimes she feels a burning pain in her foot as well  Pain interferes with sleep at night  -prior to amputation she has tried bisphosphonate infusions, lumbar sympathetic nerve block, gabapentin , nortriptyline   -she underwent IO surgery and pain worsened after nerve block wore off -she is to start scrambler therapy next week -she will be in [place] for 2 weeks for scrambler therapy -she wants to wean off the gabapentin  but not at this time given her current pain levels -she has been doing journaling and self compassion medication     CLINICAL DATA:  Severe plantar foot pain since foot surgery 3-4 weeks ago for  removal of Morton's neuroma (on 01/24/2021).   EXAM: MRI OF THE RIGHT FOREFOOT WITHOUT CONTRAST   TECHNIQUE: Multiplanar, multisequence MR imaging of the right forefoot was performed. No intravenous contrast was administered.   COMPARISON:  Office foot radiographs 11/12/2020. No previous MRI available.   FINDINGS: Bones/Joint/Cartilage   Mild degenerative changes of the 1st metatarsophalangeal joint with a small subchondral cyst in the 1st metatarsal head and a small joint effusion. No other significant arthropathic changes. There is no  evidence of acute fracture, dislocation or avascular necrosis.   Ligaments   The Lisfranc ligament is intact. The collateral ligaments of the metatarsophalangeal joints appear intact.   Muscles and Tendons   There is focal susceptibility artifact within the plantar soft tissues at the level of the 2nd and 3rd metatarsal necks, presumably postsurgical. There is mild surrounding soft tissue edema as well as trace fluid within the flexor tendon sheaths of the 2nd and 3rd digits. The interosseous muscles are mildly edematous. There is additional ill-defined low signal between the 2nd and 3rd metatarsal necks, also likely postsurgical. No drainable fluid collection.   Soft tissues   The plantar soft tissues are deformed by a capsule which was placed along the plantar aspect of the 3rd metatarsal head. As above, there is underlying soft tissue edema of and presumed postsurgical susceptibility artifact. No drainable fluid collection or soft tissue mass identified.   IMPRESSION: 1. Nonspecific soft tissue edema and susceptibility artifact in the plantar forefoot soft tissues, attributed to recent surgery. Possible mild flexor tenosynovitis of the 2nd and 3rd rays. 2. No drainable fluid collection. 3. First MTP joint degenerative changes and small effusion. No acute osseous findings.     Electronically Signed   By: Elsie Perone M.D.   On: 04/08/2021 09:41 Pain Inventory Average Pain 7 Pain Right Now 5 My pain is sharp, burning, and stabbing  In the last 24 hours, has pain interfered with the following? General activity 7 Relation with others 8 Enjoyment of life 8 What TIME of day is your pain at its worst? evening Sleep (in general) Fair  Pain is worse with: walking, sitting, and standing Pain improves with: rest, pacing activities, and medication Relief from Meds: 6  walk without assistance how many minutes can you walk? 5-10 ability to climb steps?  yes do you  drive?  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/21 I need assistance with the following:  meal prep, household duties, and shopping  trouble walking dizziness anxiety  New pt  New pt    Family History  Problem Relation Age of Onset   Depression Mother    Diabetes Mellitus II Mother    Heart Problems Father    Social History   Socioeconomic History   Marital status: Married    Spouse name: Not on file   Number of children: Not on file   Years of education: Not on file   Highest education level: Not on file  Occupational History   Not on file  Tobacco Use   Smoking status: Never   Smokeless tobacco: Never  Vaping Use   Vaping status: Never Used  Substance and Sexual Activity   Alcohol  use: Yes    Comment: social    Drug use: No   Sexual activity: Yes    Birth control/protection: Abstinence, None  Other Topics Concern   Not on file  Social History Narrative   Not on file   Social Drivers of  Health   Financial Resource Strain: Low Risk  (11/02/2023)   Received from [facility]   Overall Financial Resource Strain (CARDIA)    Difficulty of Paying Living Expenses: Not hard at all  Food Insecurity: No Food Insecurity (06/20/2024)   Received from [facility]   Food Insecurity    Within the past 12 months, the food you bought just didn't last and you didn't have money to get more.: Never true  Transportation Needs: No Transportation Needs (06/20/2024)   Received from [facility]   Transportation    In the past 12 months, has the lack of transportation kept you from medical appointments, meetings, work, or from getting things needed for daily living?: No  Physical Activity: Inactive (09/02/2021)   Received from [facility]   Exercise Vital Sign    On average, how many days per week do you engage in moderate to strenuous exercise (like a brisk walk)?: 0 days    On average, how many  minutes do you engage in exercise at this level?: 0 min  Stress: Stress Concern Present (09/02/2021)   Received from [facility] - Occupational Stress Questionnaire    Feeling of Stress : Very much  Social Connections: Unknown (04/23/2022)   Received from [facility]   Social Network    Social Network: Not on file   Past Surgical History:  Procedure Laterality Date   BREAST REDUCTION SURGERY  2006   CESAREAN SECTION     CESAREAN SECTION  2000   Past Medical History:  Diagnosis Date   Allergy    There were no vitals taken for this visit.  Opioid Risk Score:   Fall Risk Score:  `1  Depression screen PHQ 2/9     03/09/2024    1:11 PM 08/28/2023    9:56 AM 06/11/2021    2:53 PM 01/20/2016   11:27 AM  Depression screen PHQ 2/9  Decreased Interest  1 1 0  Down, Depressed, Hopeless  1 1 0  PHQ - 2 Score  2 2 0  Altered sleeping   1   Tired, decreased energy   1   Change in appetite   0   Feeling bad or failure about yourself    1   Trouble concentrating   1   Moving slowly or fidgety/restless   1   Suicidal thoughts   0   PHQ-9 Score   7   Difficult doing work/chores   Very difficult      Information is confidential and restricted. Go to Review Flowsheets to unlock data.     Review of Systems  Musculoskeletal:  Positive for gait problem.       Foot pain  Neurological:  Positive for dizziness.  All other systems reviewed and are negative.      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, vasomotor symptoms fairly mild -continue nortriptyline , change to night -d/c cymbalta   -continue gabapentin , refilled  -discussed that she had spinal cord stimulator placed by [facility] 3 weeks ago, discussed risks of tolerance over time  -discussed Scrambler therapy, recommended that she discuss this with her neurosurgeon as well, discussed cost  of the therapy  -discussed that she is pursuing osseointegration in [place]  -discussed risks and benefits of ketamine troches and infusions  -discussed  that she had a flare this weekend  -discussed of her her CRPS, her flare this weekend, ketamine troches versus infusions, discussed that she does not want to go to [facility], discussed Qutenza, discussed it as a back-up option if surgery is ineffective  Prescribed Ketamine 20%, Baclofen  2%, Cyclobenzaprine 2%, Ketoprofen 10%, Gabapentin  20%, Bupivacaine  1%, Amitryptiline 5%, clonidine 0.2% compounded cream  Dispense 240 g  Apply 1-2 g to affected area 3-4 times per day   -Discussed Qutenza as an option for neuropathic pain control. Discussed that this is a capsaicin patch, stronger than capsaicin cream. Discussed that it is currently approved for diabetic peripheral neuropathy and post-herpetic neuralgia, but that it has also shown benefit in treating other forms of neuropathy. Provided patient with link to site to learn more about the patch: https://www.clark.biz/. Discussed that the patch would be placed in office and benefits usually last 3 months. Discussed that unintended exposure to capsaicin can cause severe irritation of eyes, mucous membranes, respiratory tract, and skin, but that Qutenza is a local treatment and does not have the systemic side effects of other nerve medications. Discussed that there may be pain, itching, erythema, and decreased sensory function associated with the application of Qutenza. Side effects usually subside within 1 week. A cold pack of analgesic medications can help with these side effects. Blood pressure can also be increased due to pain associated with administration of the patch.   -Provided with a pain relief journal and discussed that it contains foods and lifestyle tips to naturally help to improve pain. Discussed that these lifestyle strategies are also very good for health unlike some  medications which can have negative side effects. Discussed that the act of keeping a journal can be therapeutic and helpful to realize patterns what helps to trigger and alleviate pain.     2) Anxiety: -referred to psychiatry -prescribed buspar  -continue lorazepam   3) Insomnia: -continue CBD gummies -continue meditation -Try to go outside near sunrise -Get exercise during the day.  -Turn off all devices an hour before bedtime.  -Teas that can benefit: chamomile, valerian root, Brahmi (Bacopa) -Can consider over the counter melatonin, magnesium, and/or L-theanine. Melatonin is an anti-oxidant with multiple health benefits. Magnesium is involved in greater than 300 enzymatic reactions in the body and most of us  are deficient as our soil is often depleted. There are 7 different types of magnesium- Bioptemizer's is a supplement with all 7 types, and each has unique benefits. Magnesium can also help with constipation and anxiety.  -Pistachios naturally increase the production of melatonin -Cozy Earth bamboo bed sheets are free from toxic chemicals.  -Tart cherry juice or a tart cherry supplement can improve sleep and soreness post-workout  4) PTSD: -discussed benefits of EMDR therapy with Tawni Husband   17 minutes spent in discussion of her pain, her plan for scrambler therapy next week, that she will be staying in [place] for 2 weeks to receive this treatment, that she would like to stay onf gabapentin  at this time given her current levels of pain post-surgery, discussed that she has started pain journaling and self compassion meditation and that she has ready Dr. Bo books

## 2024-07-12 NOTE — Progress Notes (Signed)
 THERAPIST PROGRESS NOTE  Virtual Visit via Video Note  I connected with Ashley Brandt on 07/12/24 at  1:00 PM EDT by a video enabled telemedicine application and verified that I am speaking with the correct person using two identifiers.  Location: Patient: Address on file Provider: Providers Address   I discussed the limitations of evaluation and management by telemedicine and the availability of in person appointments. The patient expressed understanding and agreed to proceed.   I discussed the assessment and treatment plan with the patient. The patient was provided an opportunity to ask questions and all were answered. The patient agreed with the plan and demonstrated an understanding of the instructions.   The patient was advised to call back or seek an in-person evaluation if the symptoms worsen or if the condition fails to improve as anticipated.  I provided 60 minutes of non-face-to-face time during this encounter.   Ashley Brandt Husband, LCSW   Session Time: 1:01pm-2:02pm  Participation Level: Active  Behavioral Response: CasualAlertEuthymic  Type of Therapy: Individual Therapy  Treatment Goals addressed:  Active     Anxiety     LTG: Elvena will score less than 5 on the Generalized Anxiety Disorder 7 Scale (GAD-7)      Start:  03/09/24    Expected End:  09/09/24         STG: [name] will reduce frequency of avoidant behaviors by 50% as evidenced by self-report in therapy sessions     Start:  03/09/24    Expected End:  09/09/24         Work with patient individually to identify the major components of a recent episode of anxiety: physical symptoms, major thoughts and images, and major behaviors they experienced     Start:  03/09/24         Perform motivational interviewing regarding physical activity     Start:  03/09/24         Coping Skills      Start:  03/09/24       Will work with the pt using CBT/DBT techniques to help the pt verbalize  an understanding of the cognitive, physiological, and behavioral components of anxiety and its treatment. This will be done by using worksheets, interactive activities, CBT/ABC thought logs, modeling, homework, role playing and journaling. Will work with pt to learn and implement coping skills that result in a reduction of anxiety and improve daily functioning per pt self-report 3 out of 5 documented sessions.         BH CCP Acute or Chronic Trauma Reaction     LTG: Elimination of maladaptive behaviors and thinking patterns which interfere with resolution of trauma as evidenced by self report     Start:  03/09/24    Expected End:  09/09/24         LTG: Recall traumatic events without becoming overwhelmed with negative emotions     Start:  03/09/24    Expected End:  09/09/24         STG: Pt reports she would like to learn how to set boundaries with her mother around religious beliefs     Start:  03/09/24    Expected End:  09/09/24         LTG: Address childhood trauma centered around religion.     Start:  03/09/24    Expected End:  09/09/24         LTG - Misc 1     Start:  03/09/24  Expected End:  09/09/24      Address PTSD related to all my medical stuff.       Provide Silvia with education on trauma-oriented therapy     Start:  03/09/24         Cooperate with trauma-focused psychotherapy techniques to reduce emotional reaction to the traumatic event      Start:  03/09/24         Teach [name] coping strategies (e.g., writing down thoughts and feelings in a journal; taking deep, slow breaths; calling a support person to talk about memories) to deal with trauma memories and sudden emotional reactions without becoming emotionally n     Start:  03/09/24         Educate [name] on trauma influenced cognitive distortions     Start:  03/09/24         Encourage Tashari to identify 2 trauma related cognitive distortions     Start:  03/09/24             ProgressTowards Goals: Progressing  Interventions: Supportive, Reframing, and Other: EMDR  Summary: Ashley Brandt is a 53 y.o. female who presents with sxs to include but not limited to Change in energy/activity; Difficulty Concentrating; Fatigue; Hopelessness; Increase/decrease in appetite; Irritability; Tearfulness; Worthlessness; Restlessness; Sleep; Tension; Worrying; Irritability/anger; Guilt/shame. Pt was oriented times 5. Pt was cooperative and engaged. Pt denies SI/HI/AVH.    The patient reported successfully using EFT tapping to manage her pain flare and anxiety. She reflected on her recent psychiatry appointment, expressing that she feels EMDR has contributed to her avoidance behaviors.   The patient discussed a past situation that caused her significant regret and guilt, considering how this experience affected her entire family. She questioned her intentions and mentioned that she often criticizes herself emotionally due to feelings of regret. She acknowledged that this situation has negatively impacted her self-esteem and her ability to trust herself. The patient identified how messages from her childhood have distorted her ability to recognize red flags in relationships. We explored the importance of giving herself grace and assessed the potential benefits of journaling and conversing with her younger self. The patient reflected on her personal growth throughout this process. The patient asked to continue to process this experience as she works towards forgiving herself.   Suicidal/Homicidal: Nowithout intent/plan  Therapist Response: Clinician utilized active and supportive reflection to create a safe space for patient to process recent life events. Clinician assessed for current stressors, symptoms, safety since last session. The cln worked with the patient through socratic questioning to better understand her journey towards forgiveness following a past situation. Assisted  patient in identifying exercises she can engage in to connect with that younger version of herself and begin to address negative internal dialogue.   Plan: Return again in 2 weeks.  Diagnosis: Adjustment disorder with anxious mood  Current mild episode of major depressive disorder, unspecified whether recurrent (HCC)   Collaboration of Care: AEB psychiatrist can access notes and cln. Will review psychiatrists' notes. Check in with the patient and will see LCSW per availability. Patient agreed with treatment recommendations.   Patient/Guardian was advised Release of Information must be obtained prior to any record release in order to collaborate their care with an outside provider. Patient/Guardian was advised if they have not already done so to contact the registration department to sign all necessary forms in order for us  to release information regarding their care.   Consent: Patient/Guardian gives verbal consent for treatment and  assignment of benefits for services provided during this visit. Patient/Guardian expressed understanding and agreed to proceed.   Ashley Brandt Husband, LCSW 07/12/2024

## 2024-07-18 DIAGNOSIS — G894 Chronic pain syndrome: Secondary | ICD-10-CM | POA: Diagnosis not present

## 2024-07-21 ENCOUNTER — Encounter: Payer: Self-pay | Admitting: Physical Medicine and Rehabilitation

## 2024-07-22 ENCOUNTER — Other Ambulatory Visit: Payer: Self-pay | Admitting: Physical Medicine and Rehabilitation

## 2024-07-22 MED ORDER — NORTRIPTYLINE HCL 10 MG PO CAPS: 20.0000 mg | ORAL_CAPSULE | Freq: Every day | ORAL | 2 refills | Status: AC

## 2024-07-25 ENCOUNTER — Encounter (HOSPITAL_BASED_OUTPATIENT_CLINIC_OR_DEPARTMENT_OTHER): Admitting: General Surgery

## 2024-07-26 ENCOUNTER — Ambulatory Visit: Admitting: Licensed Clinical Social Worker

## 2024-08-09 ENCOUNTER — Ambulatory Visit (INDEPENDENT_AMBULATORY_CARE_PROVIDER_SITE_OTHER): Admitting: Licensed Clinical Social Worker

## 2024-08-09 ENCOUNTER — Other Ambulatory Visit: Payer: Self-pay | Admitting: Psychiatry

## 2024-08-09 DIAGNOSIS — T8789 Other complications of amputation stump: Secondary | ICD-10-CM | POA: Diagnosis not present

## 2024-08-09 DIAGNOSIS — G894 Chronic pain syndrome: Secondary | ICD-10-CM | POA: Diagnosis not present

## 2024-08-09 DIAGNOSIS — F4322 Adjustment disorder with anxiety: Secondary | ICD-10-CM

## 2024-08-09 DIAGNOSIS — G4700 Insomnia, unspecified: Secondary | ICD-10-CM | POA: Diagnosis not present

## 2024-08-09 DIAGNOSIS — Z8616 Personal history of COVID-19: Secondary | ICD-10-CM | POA: Diagnosis not present

## 2024-08-09 DIAGNOSIS — E7800 Pure hypercholesterolemia, unspecified: Secondary | ICD-10-CM | POA: Diagnosis not present

## 2024-08-09 DIAGNOSIS — Z9181 History of falling: Secondary | ICD-10-CM | POA: Diagnosis not present

## 2024-08-09 DIAGNOSIS — F418 Other specified anxiety disorders: Secondary | ICD-10-CM | POA: Diagnosis not present

## 2024-08-09 DIAGNOSIS — Z89511 Acquired absence of right leg below knee: Secondary | ICD-10-CM | POA: Diagnosis not present

## 2024-08-09 DIAGNOSIS — G5771 Causalgia of right lower limb: Secondary | ICD-10-CM | POA: Diagnosis not present

## 2024-08-09 NOTE — Progress Notes (Addendum)
 THERAPIST PROGRESS NOTE  Virtual Visit via Video Note  I connected with Ashley Brandt on 08/09/24 at  1:00 PM EDT by a video enabled telemedicine application and verified that I am speaking with the correct person using two identifiers.  Location: Patient: Address on file  Provider: Providers  Address   I discussed the limitations of evaluation and management by telemedicine and the availability of in person appointments. The patient expressed understanding and agreed to proceed.  I discussed the assessment and treatment plan with the patient. The patient was provided an opportunity to ask questions and all were answered. The patient agreed with the plan and demonstrated an understanding of the instructions.   The patient was advised to call back or seek an in-person evaluation if the symptoms worsen or if the condition fails to improve as anticipated.  I provided 51 minutes of non-face-to-face time during this encounter.   Ashley KATHEE Husband, LCSW   Session Time: 1-1:51pm  Participation Level: Active  Behavioral Response: CasualAlertEuthymic  Type of Therapy: Individual Therapy  Treatment Goals addressed:  Active     Anxiety     LTG: Ashley Brandt will score less than 5 on the Generalized Anxiety Disorder 7 Scale (GAD-7)      Start:  03/09/24    Expected End:  09/09/24         STG: Ashley Brandt will reduce frequency of avoidant behaviors by 50% as evidenced by self-Brandt in therapy sessions     Start:  03/09/24    Expected End:  09/09/24         Work with patient individually to identify the major components of a recent episode of anxiety: physical symptoms, major thoughts and images, and major behaviors they experienced     Start:  03/09/24         Perform motivational interviewing regarding physical activity     Start:  03/09/24         Coping Skills      Start:  03/09/24       Will work with the pt using CBT/DBT techniques to help the pt verbalize  an understanding of the cognitive, physiological, and behavioral components of anxiety and its treatment. This will be done by using worksheets, interactive activities, CBT/ABC thought logs, modeling, homework, role playing and journaling. Will work with pt to learn and implement coping skills that result in a reduction of anxiety and improve daily functioning per pt self-Brandt 3 out of 5 documented sessions.         BH CCP Acute or Chronic Trauma Reaction     LTG: Ashley Brandt     Start:  03/09/24    Expected End:  09/09/24         LTG: Recall traumatic events without becoming overwhelmed with negative emotions     Start:  03/09/24    Expected End:  09/09/24         STG: Pt reports she would like to learn how to set boundaries with her mother around religious beliefs     Start:  03/09/24    Expected End:  09/09/24         LTG: Address childhood trauma centered around religion.     Start:  03/09/24    Expected End:  09/09/24         LTG - Misc 1     Start:  03/09/24  Expected End:  09/09/24      Address PTSD related to all my medical stuff.       Provide Ashley Brandt with education on trauma-oriented therapy     Start:  03/09/24         Cooperate with trauma-focused psychotherapy techniques to reduce emotional reaction to the traumatic event      Start:  03/09/24         Teach Ashley Brandt coping strategies (e.g., writing down thoughts and feelings in a journal; taking deep, slow breaths; calling a support person to talk about memories) to deal with trauma memories and sudden emotional reactions without becoming emotionally n     Start:  03/09/24         Educate Ashley Brandt on trauma influenced cognitive distortions     Start:  03/09/24         Encourage Ashley Brandt to identify 2 trauma related cognitive distortions     Start:  03/09/24             ProgressTowards Goals: Progressing  Interventions: CBT, Supportive, and Reframing  Summary: Ashley Brandt is a 53 y.o. female who presents with sxs to include but not limited to Change in energy/activity; Difficulty Concentrating; Fatigue; Hopelessness; Increase/decrease in appetite; Irritability; Tearfulness; Worthlessness; Restlessness; Sleep; Tension; Worrying; Irritability/anger; Guilt/shame. Pt was oriented times 5. Pt was cooperative and engaged. Pt denies SI/HI/AVH.     The patient provided updates regarding her recent treatment for Complex Regional Pain Syndrome (CRPS). She discussed her exploration of alternative treatment options for coping with her pain, as well as the connection between her pain and unresolved feelings related to her past experiences.  The clinician and the patient explored the idea of processing her past through journaling as a way to work toward forgiveness of herself and others. The patient expressed a desire to continue finding healing for her inner child, who feels silenced due to her religious trauma. We discussed her understanding that she cannot hide from her shame; instead, she needs to work on sitting with it and moving toward acceptance.   We also explored how the patient defines forgiveness and challenged the common perception that forgiveness is always linked to reconciliation. Additionally, she reflected on her coping mechanisms and how she is confronting self-loathing.    Suicidal/Homicidal: Nowithout intent/plan  Therapist Response: Clinician utilized active and supportive reflection to create a safe space for patient to process recent life events. Clinician assessed for current stressors, symptoms, safety since last session. Explored connections between the patients process of forgiveness and working though shame. Defined significant processes and identified intentions she has as she continues to work through coping with uncomfortable  feelings.   Plan: Return again in 2 weeks.  Diagnosis: Adjustment disorder with anxious mood  Insomnia, unspecified type   Collaboration of Care: AEB psychiatrist can access notes and cln. Will review psychiatrists' notes. Check in with the patient and will see LCSW per availability. Patient agreed with treatment recommendations.   Patient/Guardian was advised Release of Information must be obtained prior to any record release in order to collaborate their care with an outside provider. Patient/Guardian was advised if they have not already done so to contact the registration department to sign all necessary forms in order for us  to release information regarding their care.   Consent: Patient/Guardian gives verbal consent for treatment and assignment of benefits for services provided during this visit. Patient/Guardian expressed understanding and agreed to proceed.   Ashley KATHEE Husband, LCSW 08/09/2024

## 2024-08-16 ENCOUNTER — Encounter: Payer: Self-pay | Admitting: Licensed Clinical Social Worker

## 2024-08-23 ENCOUNTER — Telehealth: Admitting: Psychiatry

## 2024-08-23 DIAGNOSIS — Z9181 History of falling: Secondary | ICD-10-CM | POA: Diagnosis not present

## 2024-08-23 DIAGNOSIS — Z8616 Personal history of COVID-19: Secondary | ICD-10-CM | POA: Diagnosis not present

## 2024-08-23 DIAGNOSIS — G894 Chronic pain syndrome: Secondary | ICD-10-CM | POA: Diagnosis not present

## 2024-08-23 DIAGNOSIS — F418 Other specified anxiety disorders: Secondary | ICD-10-CM | POA: Diagnosis not present

## 2024-08-23 DIAGNOSIS — E7800 Pure hypercholesterolemia, unspecified: Secondary | ICD-10-CM | POA: Diagnosis not present

## 2024-08-23 DIAGNOSIS — T8789 Other complications of amputation stump: Secondary | ICD-10-CM | POA: Diagnosis not present

## 2024-08-23 DIAGNOSIS — Z89511 Acquired absence of right leg below knee: Secondary | ICD-10-CM | POA: Diagnosis not present

## 2024-08-23 DIAGNOSIS — G5771 Causalgia of right lower limb: Secondary | ICD-10-CM | POA: Diagnosis not present

## 2024-08-24 ENCOUNTER — Ambulatory Visit (INDEPENDENT_AMBULATORY_CARE_PROVIDER_SITE_OTHER): Admitting: Licensed Clinical Social Worker

## 2024-08-24 DIAGNOSIS — F4322 Adjustment disorder with anxiety: Secondary | ICD-10-CM

## 2024-08-24 DIAGNOSIS — G4700 Insomnia, unspecified: Secondary | ICD-10-CM | POA: Diagnosis not present

## 2024-08-24 NOTE — Progress Notes (Signed)
 THERAPIST PROGRESS NOTE  Virtual Visit via Video Note  I connected with Ashley Brandt on 08/24/24 at  1:00 PM EDT by a video enabled telemedicine application and verified that I am speaking with the correct person using two identifiers.  Location: Patient: Address on file  Provider: [facility]   I discussed the limitations of evaluation and management by telemedicine and the availability of in person appointments. The patient expressed understanding and agreed to proceed.  I discussed the assessment and treatment plan with the patient. The patient was provided an opportunity to ask questions and all were answered. The patient agreed with the plan and demonstrated an understanding of the instructions.   The patient was advised to call back or seek an in-person evaluation if the symptoms worsen or if the condition fails to improve as anticipated.  I provided 56 minutes of non-face-to-face time during this encounter.   Ashley KATHEE Husband, LCSW   Session Time: 1-1:56pm  Participation Level: Active  Behavioral Response: CasualAlertEuthymic  Type of Therapy: Individual Therapy  Treatment Goals addressed:  Active     Anxiety     LTG: Ashley Brandt will score less than 5 on the Generalized Anxiety Disorder 7 Scale (GAD-7)      Start:  03/09/24    Expected End:  09/09/24         STG: Ashley Brandt will reduce frequency of avoidant behaviors by 50% as evidenced by self-report in therapy sessions     Start:  03/09/24    Expected End:  09/09/24         Work with patient individually to identify the major components of a recent episode of anxiety: physical symptoms, major thoughts and images, and major behaviors they experienced     Start:  03/09/24         Perform motivational interviewing regarding physical activity     Start:  03/09/24         Coping Skills      Start:  03/09/24       Will work with the pt using CBT/DBT techniques to help the pt verbalize an understanding of  the cognitive, physiological, and behavioral components of anxiety and its treatment. This will be done by using worksheets, interactive activities, CBT/ABC thought logs, modeling, homework, role playing and journaling. Will work with pt to learn and implement coping skills that result in a reduction of anxiety and improve daily functioning per pt self-report 3 out of 5 documented sessions.         BH CCP Acute or Chronic Trauma Reaction     LTG: Elimination of maladaptive behaviors and thinking patterns which interfere with resolution of trauma as evidenced by self report     Start:  03/09/24    Expected End:  09/09/24         LTG: Recall traumatic events without becoming overwhelmed with negative emotions     Start:  03/09/24    Expected End:  09/09/24         STG: Pt reports she would like to learn how to set boundaries with her mother around religious beliefs     Start:  03/09/24    Expected End:  09/09/24         LTG: Address childhood trauma centered around religion.     Start:  03/09/24    Expected End:  09/09/24         LTG - Misc 1     Start:  03/09/24  Expected End:  09/09/24      Address PTSD related to all my medical stuff.       Provide Ashley Brandt with education on trauma-oriented therapy     Start:  03/09/24         Cooperate with trauma-focused psychotherapy techniques to reduce emotional reaction to the traumatic event      Start:  03/09/24         Teach Ashley Brandt coping strategies (e.g., writing down thoughts and feelings in a journal; taking deep, slow breaths; calling a support person to talk about memories) to deal with trauma memories and sudden emotional reactions without becoming emotionally n     Start:  03/09/24         Educate Ashley Brandt on trauma influenced cognitive distortions     Start:  03/09/24         Encourage Ashley Brandt to identify 2 trauma related cognitive distortions     Start:  03/09/24            ProgressTowards Goals:  Progressing  Interventions: CBT, Strength-based, Supportive, and Reframing  Summary: Ashley Brandt is a 53 y.o. female who presents with sxs to include but not limited to Change in energy/activity; Difficulty Concentrating; Fatigue; Hopelessness; Increase/decrease in appetite; Irritability; Tearfulness; Worthlessness; Restlessness; Sleep; Tension; Worrying; Irritability/anger; Guilt/shame. Pt was oriented times 5. Pt was cooperative and engaged. Pt denies SI/HI/AVH.   Patient continues to report chronic pain. Reflected on ways in which she has managed to cope with her chronic pain through trial and error.   Cln explored patients understanding of various treatment modalities for chronic pain with the pt expressing a desire to begin brain spotting. Cln and patient explored this desire further.   The patient processed feelings about her journey to finding natural ways to improve her neural networks and the connection to her chronic pain.  She recalls her recent experience where she was led to confront ways in which her chronic pain has shaped her life specifically with her identifying with the pain.  Reflected on the journey of finding herself again and reports more good days versus bad days.  Shares the realization that she often has shrunk her emotional bandwidth to help others feel safe identifying she no longer feels this is effective for her emotional wellbeing.  Explored ways in which she is utilizing healthy boundaries to ensure her emotional safety.  Lastly, patient reflected on realizations she has engaged in regarding actively reframing social support as threats to safety and love.   Clinician reflected on patient's growth.  Clinician and patient explored intentions and ways in which patient can tap into a positive mindset regarding an upcoming treatment option at the end of the month for her chronic pain.  Suicidal/Homicidal: Nowithout intent/plan  Therapist Response: Clinician utilized  active and supportive reflection to create a safe space for patient to process recent life events. Clinician assessed for current stressors, symptoms, safety since last session.  Clinician worked with patient on reflecting on newfound realizations and changes she enforcing.  Reflected on ways in which chronic pain has impacted and shaped not only the patient's life but her personal identity.  Recall that coping skills patient can utilize to address the state of her parasympathetic nervous system.  Assisted patient in identifying ways in which she can maintain a positive perspective going into upcoming treatment.  Plan: Return again in 2 weeks.  Diagnosis: Adjustment disorder with anxious mood  Insomnia, unspecified type   Collaboration of Care: AEB  psychiatrist can access notes and cln. Will review psychiatrists' notes. Check in with the patient and will see LCSW per availability. Patient agreed with treatment recommendations.   Patient/Guardian was advised Release of Information must be obtained prior to any record release in order to collaborate their care with an outside provider. Patient/Guardian was advised if they have not already done so to contact the registration department to sign all necessary forms in order for us  to release information regarding their care.   Consent: Patient/Guardian gives verbal consent for treatment and assignment of benefits for services provided during this visit. Patient/Guardian expressed understanding and agreed to proceed.   Ashley KATHEE Husband, LCSW 08/24/2024

## 2024-08-26 NOTE — Progress Notes (Signed)
 Virtual Visit via Video Note  I connected with Ashley Brandt on 08/31/24 at  4:00 PM EDT by a video enabled telemedicine application and verified that I am speaking with the correct person using two identifiers.  Location: Patient: home Provider: home office Persons participated in the visit- patient, provider    I discussed the limitations of evaluation and management by telemedicine and the availability of in person appointments. The patient expressed understanding and agreed to proceed.   I discussed the assessment and treatment plan with the patient. The patient was provided an opportunity to ask questions and all were answered. The patient agreed with the plan and demonstrated an understanding of the instructions.   The patient was advised to call back or seek an in-person evaluation if the symptoms worsen or if the condition fails to improve as anticipated.  Katheren Sleet, MD    [facility] MD/PA/NP OP Progress Note  08/31/2024 5:11 PM Ashley Brandt  MRN:  [id number]  Chief Complaint:  Chief Complaint  Patient presents with   Follow-up   HPI:  This is a follow-up appointment for anxiety, insomnia.  She states that she is experiencing challenges.  The surgery put her into flare of pain.  She is seen neurorehabilitation, and has found it helpful especially for phantom pain.  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  She did it several years ago with limited benefit.  She also did not like the way she felt.  However, she feels desperate.  Scramble therapy did not work, although she was hoping that it would work especially because it was not an invasive.  She feels like she just need to suck it up.  On the positive note, she has been able to walk with a crutch.  She does not have any bone pain.  She has been busy at work which has been good.  She describes her mood as okay.  Although she is super hopeful, she feels super frustrated.  She would like to work on  tapering down the lorazepam  and asks if she can be prescribed half tablet.  She did have worsening in anxiety when she tried to take lower dose of lorazepam .  It was unsettling feeling.  She tried to do deep breathing and guided meditation.  She denies this anxiety during the day except today related to starting ketamine treatment.  She denies feeling depressed.  She denies SI, HI, hallucinations.  She has been taking lower dose of nortriptyline  and she believes she feels better that way.   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 She considers her mother to be the best example of a [place]. She describes her childhood as generally positive, though there were challenges related to religion. Growing up, her family attended a fundamentalist church, and she recalls feeling conflicted during that time. She moved to [place] for her husband work,    Visit Diagnosis:  ICD-10-CM   1. Adjustment disorder with anxious mood  F43.22     2. Insomnia, unspecified type  G47.00       Past Psychiatric History: Please see initial evaluation for full details. I have reviewed the history. No updates at this time.     Past Medical History:  Past Medical History:  Diagnosis Date   Allergy     Past Surgical History:  Procedure Laterality Date   BREAST REDUCTION SURGERY  2006   CESAREAN SECTION     CESAREAN SECTION  2000    Family Psychiatric History: Please see initial evaluation for full details. I have reviewed the  history. No updates at this time.     Family History:  Family History  Problem Relation Age of Onset   Depression Mother    Diabetes Mellitus II Mother    Heart Problems Father     Social History:  Social History   Socioeconomic History   Marital status: Married    Spouse name: Not on file   Number of children: Not on file   Years of education: Not on file   Highest education level: Not on file  Occupational History   Not on file  Tobacco Use   Smoking status: Never   Smokeless tobacco: Never  Vaping Use   Vaping status: Never Used  Substance and Sexual Activity   Alcohol  use: Yes    Comment: social    Drug use: No   Sexual activity: Yes    Birth control/protection: Abstinence, None  Other Topics Concern   Not on file  Social History Narrative   Not on file   Social Drivers of Health   Financial Resource Strain: Low Risk  (11/02/2023)   Received from [facility]   Overall Financial Resource Strain (CARDIA)    Difficulty of Paying Living Expenses: Not hard at all  Food Insecurity: No Food Insecurity (06/20/2024)   Received from [facility]   Food Insecurity    Within the past 12 months, the food you bought just didn't last and you didn't have money to get more.: Never true  Transportation Needs: No Transportation Needs (06/20/2024)   Received from [facility]   Transportation    In the past 12 months, has the lack of transportation kept you from medical appointments, meetings, work, or from getting things needed for daily living?: No  Physical Activity: Inactive (09/02/2021)   Received from [facility]   Exercise Vital Sign    On average, how many days per week do you engage in moderate to strenuous exercise (like a brisk walk)?: 0 days    On average, how many minutes do you engage in exercise at this level?: 0 min  Stress: Stress Concern Present (09/02/2021)   Received from [facility]  of Occupational Health - Occupational Stress Questionnaire    Feeling of Stress : Very much  Social Connections: Unknown (04/23/2022)   Received from [facility]   Social Network    Social Network: Not on file    Allergies:  Allergies  Allergen Reactions   Dust Mite Mixed Allergen Ext [Mite (D. Farinae)]     Metabolic Disorder Labs: No results found for: HGBA1C, MPG No results found for: PROLACTIN No results found for: CHOL, TRIG, HDL, CHOLHDL, VLDL, LDLCALC No results found for: TSH  Therapeutic Level Labs: No results found for: LITHIUM No results found for: VALPROATE No results found for: CBMZ  Current Medications: Current Outpatient Medications  Medication Sig Dispense Refill   [START ON 09/08/2024] LORazepam  (ATIVAN ) 0.5 MG tablet Take 1-2 tablets (0.5-1 mg total) by mouth at bedtime as needed for anxiety or sleep. 60 tablet 1   Ascorbic Acid (VITAMIN C) 1000 MG tablet Take 1,000 mg by mouth daily.     baclofen  (LIORESAL ) 10 MG tablet Take 1 tablet (10 mg total) by mouth 2 (two) times daily as needed for muscle spasms. 180 each 3   busPIRone  (BUSPAR ) 5 MG tablet Take 1 tablet (5 mg total) by mouth 3 (three) times daily as needed. 90 tablet 3   cholecalciferol (VITAMIN D3) 25 MCG (1000 UNIT) tablet Take 5,000 Units by mouth daily.     gabapentin  (NEURONTIN ) 100 MG capsule Take 1 capsule (100 mg total) by mouth 3 (three) times daily. 90 capsule 2   gabapentin  (NEURONTIN ) 800 MG tablet Take 1 tablet (800 mg total) by mouth 3 (three) times daily. 270 tablet 3   LORazepam  (ATIVAN ) 1 MG tablet Take 1 tablet (1 mg total) by mouth daily as needed for anxiety. 30 tablet 0   magnesium 30 MG tablet Take 30 mg by mouth 2 (two) times daily. 120mg      Multiple Vitamin (MULTIVITAMIN) tablet Take 1 tablet by mouth daily.     nortriptyline  (PAMELOR ) 10 MG capsule Take 2 capsules (20 mg total) by mouth at bedtime. 180 capsule 2   Omega-3 Fatty Acids (FISH OIL) 1000  MG CAPS Take 3,600 mg by mouth daily.     Suzetrigine  (JOURNAVX ) 50 MG TABS Take 1 tablet by mouth 2 (two) times daily as needed. 60 tablet 0   vitamin B-12 (CYANOCOBALAMIN) 1000 MCG tablet Take 1,000 mcg by mouth daily. 500 mcg     No current facility-administered medications for this visit.     Musculoskeletal: Strength & Muscle Tone: N/A Gait & Station: N/A Patient leans: N/A  Psychiatric Specialty Exam: Review of Systems  Psychiatric/Behavioral:  Positive for sleep disturbance. Negative for agitation, behavioral problems, confusion, decreased concentration, dysphoric mood, hallucinations, self-injury and suicidal ideas. The patient is nervous/anxious. The patient is not hyperactive.   All other systems reviewed and are negative.   There were no vitals taken for this visit.There is no height or weight on file to calculate BMI.  General Appearance: Well Groomed  Eye Contact:  Good  Speech:  Clear and Coherent  Volume:  Normal  Mood:  okay  Affect:  Appropriate, Congruent, and calm  Thought Process:  Coherent  Orientation:  Full (Time, Place, and Person)  Thought Content: Logical   Suicidal Thoughts:  No  Homicidal Thoughts:  No  Memory:  Immediate;   Good  Judgement:  Good  Insight:  Good  Psychomotor Activity:  Normal  Concentration:  Concentration: Good and Attention Span: Good  Recall:  Good  Fund of Knowledge: Good  Language: Good  Akathisia:  No  Handed:  Right  AIMS (if indicated): not done  Assets:  Communication Skills Desire for Improvement  ADL's:  Intact  Cognition: WNL  Sleep:  Fair   Screenings: GAD-7    Advertising copywriter from 03/09/2024 in [facility]  Total GAD-7 Score 11   PHQ2-9    Flowsheet Row Counselor from 03/09/2024 in [facility] Office Visit from 08/28/2023 in [facility] Office Visit from 06/11/2021 in [facility]  Physical Medicine and Rehabilitation Office Visit from 01/20/2016 in Primary Care  at [facility] Total Score 6 2 2  0  PHQ-9 Total Score 9 -- 7 --   Flowsheet Row Counselor from 03/09/2024 in [facility] ED from 03[facility]22/2023 in [facility] Department at [facility] UC from 06/17/2022 in [facility] at [facility] RISK CATEGORY No Risk No Risk No Risk     Assessment and Plan:  ENNIS DELPOZO is a 53 y.o. 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   1. Adjustment disorder with anxious mood She reports health anxiety, influenced by her father's death in his 20s and her mother's history of diabetes. Psychologically, she describes experiencing internal conflict related to her upbringing in a fundamentalist church.  History: no prior mental health history prior to BKA.  She reports slight worsening in anxiety related to starting a process of starting ketamine treatment for CRPS.  On a positive note, she has been able to start walking with a reduced phantom pain.  She reports reduction of nortriptyline , and feels better since being on the lower dose.  Psychoeducation is provided regarding a possible relapse in her mood symptoms.  Noted that although she may benefit from prazosin, the current blood pressure precludes its use.  She will greatly benefit from a ACT; she will continue to see Ms. Perkins for therapy.   2. Insomnia, unspecified type - UDS positive for cannabinoid, 11/2023. On Sound Asleep, which contains L-Theanine 100mg , Full Spectrum Hemp Extract (from hemp extract aerial parts) 58mg , Cannabidiol (CBD) (hemp extract aerial parts) 50mg , THC (from hemp extract aerial parts) 5mg , Additional Minor Cannabinoids(from hemp extract aerial parts) 3mg , Melatonin 3mg     She reports worsening in insomnia  related to anxiety when she tried to taper down lorazepam .  She may be experiencing some physical dependence given she denies any anxiety during the day.  Will plan to slowly reduce the dose to mitigate risk of withdrawal symptoms.  She is willing to try this. She is aware of the potential long-term risk of dependence, tolerance, and risk of respiratory suppression with concomitant use of opioid, gabapentin .   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 We discussed concerns regarding long-term THC use, and she expressed understanding.  Will continue motivational interview.     Plan Decrease lorazepam  0.5-1 mg daily as needed for insomnia, anxiety (previously taking 1 mg at night) Next appointment- 11/12 at 4 pm, video - on gabapentin  800 mg TID, nortriptyline  10 mg daily   Past trials of medication: duloxetine  (did not feel like herself), Mirtazapine (increase in appetite), trazodone (visual changes).  Lyrica , amitriptyline   The patient demonstrates the following risk factors for suicide: Chronic risk factors for suicide include: chronic pain. Acute risk factors for suicide include: loss (financial, interpersonal, professional). Protective factors for this patient include: positive social support, responsibility to others (children, family), coping skills, and hope for the future. Considering these factors, the overall suicide risk at this point appears to be low. Patient is appropriate for outpatient follow up.   A total of 30 minutes was spent on the following activities during the encounter date, which includes but is not limited to: preparing to see the patient (e.g., reviewing tests and records), obtaining and/or reviewing separately obtained history, performing a medically necessary examination or evaluation, counseling and educating  the patient, family, or caregiver, ordering medications, tests, or procedures, referring and communicating with other healthcare  professionals (when not reported separately), documenting clinical information in the electronic or paper health record, independently interpreting test or lab results and communicating these results to the family or caregiver, and coordinating care (when not reported separately).   Collaboration of Care: Collaboration of Care: Other reviewed notes in Epic  Patient/Guardian was advised Release of Information must be obtained prior to any record release in order to collaborate their care with an outside provider. Patient/Guardian was advised if they have not already done so to contact the registration department to sign all necessary forms in order for us  to release information regarding their care.   Consent: Patient/Guardian gives verbal consent for treatment and assignment of benefits for services provided during this visit. Patient/Guardian expressed understanding and agreed to proceed.    Katheren Sleet, MD 08/31/2024, 5:11 PM

## 2024-08-29 ENCOUNTER — Ambulatory Visit
Admission: RE | Admit: 2024-08-29 | Discharge: 2024-08-29 | Disposition: A | Discharge status: Home / Self Care | Source: Ambulatory Visit | Attending: Orthopedic Surgery | Admitting: Orthopedic Surgery

## 2024-08-29 ENCOUNTER — Other Ambulatory Visit: Payer: Self-pay | Admitting: Orthopedic Surgery

## 2024-08-29 DIAGNOSIS — Z899 Acquired absence of limb, unspecified: Secondary | ICD-10-CM | POA: Diagnosis not present

## 2024-08-29 DIAGNOSIS — Z89511 Acquired absence of right leg below knee: Secondary | ICD-10-CM

## 2024-08-29 NOTE — Progress Notes (Signed)
 rescheduled

## 2024-08-31 ENCOUNTER — Encounter: Payer: Self-pay | Admitting: Psychiatry

## 2024-08-31 ENCOUNTER — Telehealth (INDEPENDENT_AMBULATORY_CARE_PROVIDER_SITE_OTHER): Admitting: Psychiatry

## 2024-08-31 DIAGNOSIS — G4700 Insomnia, unspecified: Secondary | ICD-10-CM | POA: Diagnosis not present

## 2024-08-31 DIAGNOSIS — F4322 Adjustment disorder with anxiety: Secondary | ICD-10-CM

## 2024-08-31 MED ORDER — LORAZEPAM 0.5 MG PO TABS: 0.5000 mg | ORAL_TABLET | Freq: Every evening | ORAL | 1 refills | Status: AC | PRN

## 2024-09-01 ENCOUNTER — Encounter: Payer: Self-pay | Admitting: Physical Medicine and Rehabilitation

## 2024-09-05 ENCOUNTER — Ambulatory Visit: Admitting: Licensed Clinical Social Worker

## 2024-09-05 ENCOUNTER — Encounter: Admitting: Physical Medicine and Rehabilitation

## 2024-09-14 DIAGNOSIS — G90521 Complex regional pain syndrome I of right lower limb: Secondary | ICD-10-CM | POA: Diagnosis not present

## 2024-09-19 ENCOUNTER — Encounter: Payer: Self-pay | Admitting: Licensed Clinical Social Worker

## 2024-09-19 ENCOUNTER — Ambulatory Visit: Admitting: Licensed Clinical Social Worker

## 2024-09-19 DIAGNOSIS — G4700 Insomnia, unspecified: Secondary | ICD-10-CM | POA: Diagnosis not present

## 2024-09-19 DIAGNOSIS — F4322 Adjustment disorder with anxiety: Secondary | ICD-10-CM | POA: Diagnosis not present

## 2024-09-19 NOTE — Progress Notes (Signed)
 THERAPIST PROGRESS NOTE  Progress Review  Virtual Visit via Video Note  I connected with Ashley Brandt on 09/19/24 at  1:00 PM EDT by a video enabled telemedicine application and verified that I am speaking with the correct person using two identifiers.  Location: Patient: Address on file Provider: Providers Address   I discussed the limitations of evaluation and management by telemedicine and the availability of in person appointments. The patient expressed understanding and agreed to proceed.   I discussed the assessment and treatment plan with the patient. The patient was provided an opportunity to ask questions and all were answered. The patient agreed with the plan and demonstrated an understanding of the instructions.   The patient was advised to call back or seek an in-person evaluation if the symptoms worsen or if the condition fails to improve as anticipated.  I provided 60 minutes of non-face-to-face time during this encounter.   Ashley Brandt Husband, LCSW   Session Time: 1-2pm  Participation Level: Active  Behavioral Response: CasualAlertEuthymic  Type of Therapy: Individual Therapy  Treatment Goals addressed:  Active     Anxiety     LTG: Marnee will score less than 5 on the Generalized Anxiety Disorder 7 Scale (GAD-7)  (Progressing)     Start:  03/09/24    Expected End:  12/19/24       Goal Note     09/19/24: I feel like my anxiety has improved. Last week, my anxiety was noticeable. I could feel my breath being shallow and I was sensitive to unexpected noises.          STG: Kenadie will reduce frequency of avoidant behaviors by 50% as evidenced by self-report in therapy sessions (Progressing)     Start:  03/09/24    Expected End:  12/19/24       Goal Note     09/19/24: Reports complication from CRPS treatment that led to dissociative tendencies and feelings of loneliness. Shares this has improved since 09/14/24.         Work with patient  individually to identify the major components of a recent episode of anxiety: physical symptoms, major thoughts and images, and major behaviors they experienced     Start:  03/09/24         Perform motivational interviewing regarding physical activity     Start:  03/09/24         Coping Skills      Start:  03/09/24       Will work with the pt using CBT/DBT techniques to help the pt verbalize an understanding of the cognitive, physiological, and behavioral components of anxiety and its treatment. This will be done by using worksheets, interactive activities, CBT/ABC thought logs, modeling, homework, role playing and journaling. Will work with pt to learn and implement coping skills that result in a reduction of anxiety and improve daily functioning per pt self-report 3 out of 5 documented sessions.         BH CCP Acute or Chronic Trauma Reaction     LTG: Elimination of maladaptive behaviors and thinking patterns which interfere with resolution of trauma as evidenced by self report (Progressing)     Start:  03/09/24    Expected End:  12/19/24         LTG: Recall traumatic events without becoming overwhelmed with negative emotions (Progressing)     Start:  03/09/24    Expected End:  12/19/24         STG:  Pt reports she would like to learn how to set boundaries with her mother around religious beliefs (Progressing)     Start:  03/09/24    Expected End:  12/19/24         LTG: Address childhood trauma centered around religion. 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       Provide Faven with education on trauma-oriented therapy     Start:  03/09/24         Cooperate with trauma-focused psychotherapy techniques to reduce emotional reaction to the traumatic event      Start:  03/09/24         Teach [name]  coping strategies (e.g., writing down thoughts and feelings in a journal; taking deep, slow breaths; calling a support person to talk about memories) to deal with trauma memories and sudden emotional reactions without becoming emotionally n     Start:  03/09/24         Educate [name] on trauma influenced cognitive distortions     Start:  03/09/24         Encourage Telia to identify 2 trauma related cognitive distortions     Start:  03/09/24            ProgressTowards Goals: Progressing  Interventions: Solution Focused, Strength-based, and Supportive  Summary: Ashley Brandt is a 53 y.o. female who presents with sxs to include but not limited to Change in energy/activity; Difficulty Concentrating; Fatigue; Hopelessness; Increase/decrease in appetite; Irritability; Tearfulness; Worthlessness; Restlessness; Sleep; Tension; Worrying; Irritability/anger; Guilt/shame. Pt was oriented times 5. Pt was cooperative and engaged. Pt denies SI/HI/AVH.   Cln utilized the first half of session to review patients progress. See progress notes documented above.   The clinician readministered the PHQ-9 and GAD-7 assessments. The patient's anxiety scores decreased from 11 to 5, and her depression scores also decreased from 9 to 7. The patient reported, I feel like my anxiety has improved. Last week, my anxiety was noticeable. I could feel my breath being shallow and I was sensitive to unexpected noises. She has adjusted her medications to half doses, with the goal of eventually getting off all mental health medications.  The patient mentioned that following a procedure, she has started walking again. However, she shared that she is now experiencing phantom pain, which makes it difficult for her to walk. She is currently working with a neurorehabilitation specialist to address this issue and shared that she has not walked in a year and a half.  She reported experiencing dissociative complications  from ketamine treatment, which led to strong feelings of loneliness that lasted for a week following her treatment. To cope, she practiced self-compassion, grounding techniques, and intellectualized her symptoms, journaling, using somatic techniques, practicing bilateral tapping, and engaging in breathwork.  Despite the clinician's efforts to encourage her to celebrate her physical abilities, she is experiencing cautious optimism. The patient expressed significant frustration with her pain management procedures.  She reflected on her frustrations with the medical care system, stating that she feels overwhelmed and perceives her world as shrinking. It seems like something awful has happened, and she is trying to be more present and grounded as she moves forward.  Reports a desire to work  on healing her inner child and nurturing her inner critique through EMDR.   Suicidal/Homicidal: Nowithout intent/plan  Therapist Response:  Clinician utilized active and supportive reflection to create a safe space for patient to process recent life events. Clinician assessed for current stressors, symptoms, safety since last session. The clinician readministered the PHQ-9 and GAD-7 assessments. Updated TP. Reflected on patients use of coping skills to maintain hope and pain.   Plan: Return again in 2 weeks.  Diagnosis: Adjustment disorder with anxious mood  Insomnia, unspecified type   Collaboration of Care:  AEB psychiatrist can access notes and cln. Will review psychiatrists' notes. Check in with the patient and will see LCSW per availability. Patient agreed with treatment recommendations.   Patient/Guardian was advised Release of Information must be obtained prior to any record release in order to collaborate their care with an outside provider. Patient/Guardian was advised if they have not already done so to contact the registration department to sign all necessary forms in order for us  to release  information regarding their care.   Consent: Patient/Guardian gives verbal consent for treatment and assignment of benefits for services provided during this visit. Patient/Guardian expressed understanding and agreed to proceed.   Ashley Brandt Husband, LCSW 09/19/2024

## 2024-09-21 DIAGNOSIS — G546 Phantom limb syndrome with pain: Secondary | ICD-10-CM | POA: Diagnosis not present

## 2024-09-21 DIAGNOSIS — G90521 Complex regional pain syndrome I of right lower limb: Secondary | ICD-10-CM | POA: Diagnosis not present

## 2024-09-23 ENCOUNTER — Encounter: Payer: Self-pay | Admitting: Physical Medicine and Rehabilitation

## 2024-09-26 ENCOUNTER — Encounter: Payer: Self-pay | Admitting: Physical Medicine and Rehabilitation

## 2024-09-26 ENCOUNTER — Encounter: Attending: Physical Medicine and Rehabilitation | Admitting: Physical Medicine and Rehabilitation

## 2024-09-26 VITALS — BP 100/71 | HR 79 | Ht 62.25 in | Wt 125.4 lb

## 2024-09-26 DIAGNOSIS — F411 Generalized anxiety disorder: Secondary | ICD-10-CM | POA: Insufficient documentation

## 2024-09-26 DIAGNOSIS — G5771 Causalgia of right lower limb: Secondary | ICD-10-CM | POA: Insufficient documentation

## 2024-09-26 NOTE — Progress Notes (Signed)
 Subjective:    Patient ID: Ashley Brandt, female    DOB: Nov 23, 1971, 53 y.o.   MRN: [id number]  HPI  CC: RIght foot /toe pain  50) CRPS 53 year old female referred by her primary care physician for the evaluation of right foot and toe pain.  The patient states that she has 2 types of pain 1 that is on the bottom of the foot right around the area of the surgery the other area is more in the middle of the foot.  She really has few symptoms at the ankle or above.  She notes mild swelling in the right foot sometimes discoloration as well.  It is very hypersensitive to touch particularly on the plantar surface. 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  She was started on Lyrica  and had some side effects and did not wish to continue taking it.  The patient also had a behavioral health evaluation 04/02/2021 diagnosed with insomnia and adjustment disorder with mixed anxiety depressed mood.  She tried Ambien  and melatonin.  She has been on Cymbalta  30 mg up to 60 mg a day.  She is now on gabapentin  600 mg 3 times per day.  The patient has had a second opinion with orthopedic surgeon.  The patient has had an arterial Doppler on 04/23/2021 which was normal.  She is scheduling surgery at [facility] with a peripheral neurosurgeon with plans for resection of neuroma as well as wrapping the end of the neuroma with abdominal epidermis to prevent reforming  -using the torsion foot in PT has been   -she does not like the ketamine but she does feel that it helped her to walk, she feels dissociated for 1 week after- she felt disconnected from herself and her family  -she started walking and has found the torsion foot very helpful  -scrambler was ineffective  -she has always been active and started to run a lot more during the pandemic. 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 She has had more pain and less function.   -she wanted a neurosurgeon to place her spinal cord stimulator  -she asks about compounding medication  CRPS has gotten considerably worse, she is currently 3 weeks post-op spinal cord stimulator placement, she does find it helps, but knows it will take time to get used it   -she currently feels a burning pain in her residual limb  -she had muscle reintigration  -has tried ketamine infusions before, she did not love doing them but she did feel that they helped, she is not a fan of tripping and she did feel that when she was coming out of the infusion  -sometimes she feels a burning pain in her foot as well  Pain interferes with sleep at night  -prior to amputation she has tried bisphosphonate infusions, lumbar sympathetic nerve block, gabapentin , nortriptyline   -she underwent IO surgery and pain worsened after nerve block wore off -she is to start scrambler therapy next week -she will be in [place] for 2 weeks for scrambler therapy -she wants to wean off the gabapentin  but not at this time given her current pain levels -she has been doing journaling and self compassion medication     CLINICAL DATA:  Severe plantar foot pain since foot surgery 3-4 weeks ago for removal of Morton's neuroma (on 01/24/2021).  EXAM: MRI OF THE RIGHT FOREFOOT WITHOUT CONTRAST   TECHNIQUE: Multiplanar, multisequence MR imaging of the right forefoot was performed. No intravenous contrast was administered.   COMPARISON:  Office foot radiographs 11/12/2020. No previous MRI available.   FINDINGS: Bones/Joint/Cartilage   Mild degenerative changes of the 1st metatarsophalangeal joint with a small subchondral cyst in the 1st metatarsal head and a small joint effusion. No other significant arthropathic changes. There is no evidence of acute fracture, dislocation or avascular necrosis.   Ligaments   The Lisfranc ligament is intact.  The collateral ligaments of the metatarsophalangeal joints appear intact.   Muscles and Tendons   There is focal susceptibility artifact within the plantar soft tissues at the level of the 2nd and 3rd metatarsal necks, presumably postsurgical. There is mild surrounding soft tissue edema as well as trace fluid within the flexor tendon sheaths of the 2nd and 3rd digits. The interosseous muscles are mildly edematous. There is additional ill-defined low signal between the 2nd and 3rd metatarsal necks, also likely postsurgical. No drainable fluid collection.   Soft tissues   The plantar soft tissues are deformed by a capsule which was placed along the plantar aspect of the 3rd metatarsal head. As above, there is underlying soft tissue edema of and presumed postsurgical susceptibility artifact. No drainable fluid collection or soft tissue mass identified.   IMPRESSION: 1. Nonspecific soft tissue edema and susceptibility artifact in the plantar forefoot soft tissues, attributed to recent surgery. Possible mild flexor tenosynovitis of the 2nd and 3rd rays. 2. No drainable fluid collection. 3. First MTP joint degenerative changes and small effusion. No acute osseous findings.     Electronically Signed   By: Elsie Perone M.D.   On: 04/08/2021 09:41 Pain Inventory Average Pain 7 Pain Right Now 5-6 My pain is intermittent and constant sharp, burning, and stabbing, aching, tingling  In the last 24 hours, has pain interfered with the following? General activity 10 Relation with others 10 Enjoyment of life 10 What TIME of day is your pain at its worst? evening and night Sleep (in general) poor to Fair  Pain is worse with: walking, sitting, and standing Pain improves with: rest, pacing activities, and medication Relief from Meds: 4  walk without assistance how many minutes can you walk? 5-10 ability to climb steps?  yes do you drive?  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: date last employed 01/31/21 I need assistance with the following:  meal prep, household duties, and shopping  trouble walking dizziness anxiety  New pt  New pt    Family History  Problem Relation Age of Onset   Depression Mother    Diabetes Mellitus II Mother    Heart Problems Father    Social History   Socioeconomic History   Marital status: Married    Spouse name: Not on file   Number of children: Not on file   Years of education: Not on file   Highest education level: Not on file  Occupational History   Not on file  Tobacco Use   Smoking status: Never   Smokeless tobacco: Never  Vaping Use   Vaping status: Never Used  Substance and Sexual Activity   Alcohol  use: Yes    Comment: social    Drug use: No   Sexual activity: Yes    Birth control/protection: Abstinence, None  Other Topics Concern   Not on file  Social History Narrative   Not on file   Social Drivers  of Health   Financial Resource Strain: Low Risk  (11/02/2023)   Received from [facility]   Overall Financial Resource Strain (CARDIA)    Difficulty of Paying Living Expenses: Not hard at all  Food Insecurity: No Food Insecurity (06/20/2024)   Received from [facility]   Food Insecurity    Within the past 12 months, the food you bought just didn't last and you didn't have money to get more.: Never true  Transportation Needs: No Transportation Needs (06/20/2024)   Received from [facility]   Transportation    In the past 12 months, has the lack of transportation kept you from medical appointments, meetings, work, or from getting things needed for daily living?: No  Physical Activity: Inactive (09/02/2021)   Received from [facility]   Exercise Vital Sign    On average, how many days per week do you engage in moderate to strenuous exercise (like a brisk walk)?: 0 days    On average, how many minutes do you engage in exercise at this  level?: 0 min  Stress: Stress Concern Present (09/02/2021)   Received from [facility] - Occupational Stress Questionnaire    Feeling of Stress : Very much  Social Connections: Unknown (04/23/2022)   Received from [facility]   Social Network    Social Network: Not on file   Past Surgical History:  Procedure Laterality Date   BREAST REDUCTION SURGERY  2006   CESAREAN SECTION     CESAREAN SECTION  2000   Past Medical History:  Diagnosis Date   Allergy    BP 100/71 (BP Location: Left Arm, Patient Position: Sitting, Cuff Size: Normal)   Pulse 79   Ht 5' 2.25 (1.581 m)   Wt 125 lb 6.4 oz (56.9 kg)   SpO2 97%   BMI 22.75 kg/m   Opioid Risk Score:   Fall Risk Score:  `1  Depression screen [facility] 2/9     09/26/2024   11:01 AM 09/19/2024    1:29 PM 03/09/2024    1:11 PM 08/28/2023    9:56 AM 06/11/2021    2:53 PM 01/20/2016   11:27 AM  Depression screen PHQ 2/9  Decreased Interest 0   1 1 0  Down, Depressed, Hopeless 0   1 1 0  PHQ - 2 Score 0   2 2 0  Altered sleeping     1   Tired, decreased energy     1   Change in appetite     0   Feeling bad or failure about yourself      1   Trouble concentrating     1   Moving slowly or fidgety/restless     1   Suicidal thoughts     0   PHQ-9 Score     7   Difficult doing work/chores     Very difficult      Information is confidential and restricted. Go to Review Flowsheets to unlock data.     Review of Systems  Musculoskeletal:  Positive for gait problem.       Foot pain  Neurological:  Positive for dizziness.  All other systems reviewed and are negative.      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, vasomotor symptoms fairly mild -continue nortriptyline ,  change to night -d/c cymbalta   -continue gabapentin , refilled  -discussed that she had spinal cord stimulator placed by NS[facility]  3 weeks ago, discussed risks of tolerance over time  -discussed Scrambler therapy, recommended that she discuss this with her neurosurgeon as well, discussed cost of the therapy  -discussed that she is pursuing osseointegration in [place]  -discussed risks and benefits of ketamine troches and infusions  -discussed that she had a flare this weekend  -discussed of her her CRPS, her flare this weekend, ketamine troches versus infusions, discussed that she does not want to go to [facility] in [place], discussed Qutenza, discussed it as a back-up option if surgery is ineffective  -discussed that Journavx  did not help  -discussed that Nucynta did not help, discussed that she hated Cymbalta  in the past  -discussed that she weaned down to 10mg  of the Pamelor   Prescribed Ketamine 20%, Baclofen  2%, Cyclobenzaprine 2%, Ketoprofen 10%, Gabapentin  20%, Bupivacaine  1%, Amitryptiline 5%, clonidine 0.2% compounded cream  Dispense 240 g  Apply 1-2 g to affected area 3-4 times per day, discussed that she used it for the first time last night  -discussed that the ketamine caused her to dissociate from her pain and family -discussed that she had 3 daily sessions of ketamine, discussed that she went to [place] because she could not find anyone in in this area.    -discussed mechanism of action of low dose naltrexone as an opioid receptor antagonist which stimulates your body's production of its own natural endogenous opioids, helping to decrease pain. Discussed that it can also decrease T cell response and thus be helpful in decreasing inflammation, and symptoms of brain fog, fatigue, anxiety, depression, and allergies. Discussed that this medication needs to be compounded at a compounding pharmacy and can more expensive. Discussed that I usually start at 1mg  and if this is not providing enough relief then I titrate upward on a monthly basis. 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 She recently underwent transtibial osseointegration surgery on 06/17/2024. Ashley Brandt was previously ambulatory with a traditional transtibial prosthesis, which is now 110 months old.  Her current prosthetic foot and alignable components no longer meet her functional needs following osseointegration. Unlike traditional socket suspension, osseointegration transfers load directly through the skeletal system, making the implant highly sensitive to repetitive vertical impact and rotational shear forces. Ashley Brandt's existing foot cannot provide adequate vertical shock absorption or torsional rotation, resulting in increased discomfort, gait hesitancy, slower walking speed on uneven terrain, and reduced overall satisfaction and quality of life.  A vertical shock and torsion foot is medically necessary to protect the osseointegration implant by minimizing repetitive impact and rotational shear forces. These features will improve comfort, confidence, and mobility on uneven surfaces, directly supporting safe community ambulation. Updated alignable components are also required to fine-tune sagittal, coronal, and transverse alignment with the new foot, ensuring proper load distribution and adaptability during rehabilitation. Together, these additions will restore functional independence, reduce implant stress, and enhance overall quality of life.  Ashley Brandt demonstrates the ability and potential for community ambulation with variable cadence, consistent with a K3 functional level. She lives independently in a multi-level home with stairs and ramps, performs household tasks, and intends to return to vocational and recreational activities requiring walking on uneven terrain. She has the cognitive ability, limb strength, range of motion, and cardiovascular health to safely use a transtibial  prosthesis. A replacement  foot with vertical shock and torsion features, along with updated alignable components, is medically necessary to support her rehabilitation, reduce pain and skin breakdown, and promote safe, independent ambulation and participation in daily and community activities.  -discussed that she was wondering about [facility]  -Discussed Qutenza as an option for neuropathic pain control. Discussed that this is a capsaicin patch, stronger than capsaicin cream. Discussed that it is currently approved for diabetic peripheral neuropathy and post-herpetic neuralgia, but that it has also shown benefit in treating other forms of neuropathy. Provided patient with link to site to learn more about the patch: https://www.clark.biz/. Discussed that the patch would be placed in office and benefits usually last 3 months. Discussed that unintended exposure to capsaicin can cause severe irritation of eyes, mucous membranes, respiratory tract, and skin, but that Qutenza is a local treatment and does not have the systemic side effects of other nerve medications. Discussed that there may be pain, itching, erythema, and decreased sensory function associated with the application of Qutenza. Side effects usually subside within 1 week. A cold pack of analgesic medications can help with these side effects. Blood pressure can also be increased due to pain associated with administration of the patch.   -Provided with a pain relief journal and discussed that it contains foods and lifestyle tips to naturally help to improve pain. Discussed that these lifestyle strategies are also very good for health unlike some medications which can have negative side effects. Discussed that the act of keeping a journal can be therapeutic and helpful to realize patterns what helps to trigger and alleviate pain.     2) Anxiety: -referred to psychiatry -prescribed buspar  -continue lorazepam   3) Insomnia: -continue CBD gummies -continue  meditation -Try to go outside near sunrise -Get exercise during the day.  -Turn off all devices an hour before bedtime.  -Teas that can benefit: chamomile, valerian root, Brahmi (Bacopa) -Can consider over the counter melatonin, magnesium, and/or L-theanine. Melatonin is an anti-oxidant with multiple health benefits. Magnesium is involved in greater than 300 enzymatic reactions in the body and most of us  are deficient as our soil is often depleted. There are 7 different types of magnesium- Bioptemizer's is a supplement with all 7 types, and each has unique benefits. Magnesium can also help with constipation and anxiety.  -Pistachios naturally increase the production of melatonin -[employer]zy [employer] bamboo bed sheets are free from toxic chemicals.  -Tart cherry juice or a tart cherry supplement can improve sleep and soreness post-workout  4) PTSD: -discussed benefits of EMDR therapy with Christina Perkins

## 2024-10-03 ENCOUNTER — Ambulatory Visit: Admitting: Licensed Clinical Social Worker

## 2024-10-03 DIAGNOSIS — F4322 Adjustment disorder with anxiety: Secondary | ICD-10-CM | POA: Diagnosis not present

## 2024-10-03 DIAGNOSIS — G4700 Insomnia, unspecified: Secondary | ICD-10-CM | POA: Diagnosis not present
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
## 2024-10-04 ENCOUNTER — Other Ambulatory Visit: Payer: Self-pay | Admitting: Physical Medicine and Rehabilitation

## 2024-10-04 ENCOUNTER — Encounter (HOSPITAL_BASED_OUTPATIENT_CLINIC_OR_DEPARTMENT_OTHER): Admitting: Physical Medicine and Rehabilitation

## 2024-10-04 DIAGNOSIS — F411 Generalized anxiety disorder: Secondary | ICD-10-CM

## 2024-10-04 DIAGNOSIS — G5771 Causalgia of right lower limb: Secondary | ICD-10-CM | POA: Diagnosis not present

## 2024-10-04 MED ORDER — BUPRENORPHINE 5 MCG/HR TD PTWK: 1.0000 | MEDICATED_PATCH | TRANSDERMAL | 0 refills | Status: DC

## 2024-10-04 NOTE — Progress Notes (Signed)
 Subjective:    Patient ID: Ashley Brandt, female    DOB: Jan 28, 1971, 53 y.o.   MRN: [id number]  HPI An audio/video tele-health visit is felt to be the most appropriate encounter for this patient at this time. This is a follow up tele-visit via phone. The patient is at home. MD is at office. Prior to scheduling this appointment, our staff discussed the limitations of evaluation and management by telemedicine and the availability of in-person appointments. The patient expressed understanding and agreed to proceed.   CC: RIght foot /toe pain  52) CRPS 53 year old female referred by her primary care physician for the evaluation of right foot and toe pain.  The patient states that she has 2 types of pain 1 that is on the bottom of the foot right around the area of the surgery the other area is more in the middle of the foot.  She really has few symptoms at the ankle or above.  She notes mild swelling in the right foot sometimes discoloration as well.  It is very hypersensitive to touch particularly on the plantar surface. 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  She was started on Lyrica  and had some side effects and did not wish to continue taking it.  The patient also had a behavioral health evaluation 04/02/2021 diagnosed with insomnia and adjustment disorder with mixed anxiety depressed mood.  She tried Ambien  and melatonin.  She has been on Cymbalta  30 mg up to 60 mg a day.  She is now on gabapentin  600 mg 3 times per day.  The patient has had a second opinion with orthopedic surgeon.  The patient has had an arterial Doppler on 04/23/2021 which was normal.  She is scheduling surgery at [facility] with a peripheral neurosurgeon with plans for resection of neuroma as well as wrapping the end of the neuroma with abdominal epidermis to prevent reforming  -using the torsion foot in PT has been   -she does not like the  ketamine but she does feel that it helped her to walk, she feels dissociated for 1 week after- she felt disconnected from herself and her family  -she started walking and has found the torsion foot very helpful  -scrambler was ineffective  -she has always been active and started to run a lot more during the pandemic. 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 She has had more pain and less function.   -she wanted a neurosurgeon to place her spinal cord stimulator  -she asks about compounding medication  CRPS has gotten considerably worse, she is currently 3 weeks post-op spinal cord stimulator placement, she does find it helps, but knows it will take time to get used it   -she currently feels a burning pain in her residual limb  -she had muscle reintigration  -has tried ketamine infusions before, she did not love doing them but she did feel that they helped, she is not a fan of tripping and she did feel that when she was coming out of the infusion  -sometimes she feels a burning pain in her foot as well  Pain interferes with sleep at night  -prior to amputation she has tried bisphosphonate infusions, lumbar sympathetic nerve block, gabapentin , nortriptyline   -she underwent IO surgery and pain worsened after nerve block wore off -she is to start  scrambler therapy next week -she will be in [place] for 2 weeks for scrambler therapy -she wants to wean off the gabapentin  but not at this time given her current pain levels -she has been doing journaling and self compassion medication  She is scheduled to have a sympathetic nerve block 1 week from thursday     CLINICAL DATA:  Severe plantar foot pain since foot surgery 3-4 weeks ago for removal of Morton's neuroma (on 01/24/2021).   EXAM: MRI OF THE RIGHT FOREFOOT WITHOUT CONTRAST   TECHNIQUE: Multiplanar, multisequence MR imaging of the right forefoot  was performed. No intravenous contrast was administered.   COMPARISON:  Office foot radiographs 11/12/2020. No previous MRI available.   FINDINGS: Bones/Joint/Cartilage   Mild degenerative changes of the 1st metatarsophalangeal joint with a small subchondral cyst in the 1st metatarsal head and a small joint effusion. No other significant arthropathic changes. There is no evidence of acute fracture, dislocation or avascular necrosis.   Ligaments   The Lisfranc ligament is intact. The collateral ligaments of the metatarsophalangeal joints appear intact.   Muscles and Tendons   There is focal susceptibility artifact within the plantar soft tissues at the level of the 2nd and 3rd metatarsal necks, presumably postsurgical. There is mild surrounding soft tissue edema as well as trace fluid within the flexor tendon sheaths of the 2nd and 3rd digits. The interosseous muscles are mildly edematous. There is additional ill-defined low signal between the 2nd and 3rd metatarsal necks, also likely postsurgical. No drainable fluid collection.   Soft tissues   The plantar soft tissues are deformed by a capsule which was placed along the plantar aspect of the 3rd metatarsal head. As above, there is underlying soft tissue edema of and presumed postsurgical susceptibility artifact. No drainable fluid collection or soft tissue mass identified.   IMPRESSION: 1. Nonspecific soft tissue edema and susceptibility artifact in the plantar forefoot soft tissues, attributed to recent surgery. Possible mild flexor tenosynovitis of the 2nd and 3rd rays. 2. No drainable fluid collection. 3. First MTP joint degenerative changes and small effusion. No acute osseous findings.     Electronically Signed   By: Elsie Perone M.D.   On: 04/08/2021 09:41 Pain Inventory Average Pain 7 Pain Right Now 5-6 My pain is intermittent and constant sharp, burning, and stabbing, aching, tingling  In the last 24  hours, has pain interfered with the following? General activity 10 Relation with others 10 Enjoyment of life 10 What TIME of day is your pain at its worst? evening and night Sleep (in general) poor to Fair  Pain is worse with: walking, sitting, and standing Pain improves with: rest, pacing activities, and medication Relief from Meds: 4  walk without assistance how many minutes can you walk? 5-10 ability to climb steps?  yes do you drive?  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/21 I need assistance with the following:  meal prep, household duties, and shopping  trouble walking dizziness anxiety  New pt  New pt    Family History  Problem Relation Age of Onset   Depression Mother    Diabetes Mellitus II Mother    Heart Problems Father    Social History   Socioeconomic History   Marital status: Married    Spouse name: Not on file   Number of children: Not on file   Years of education: Not on file   Highest education level: Not on file  Occupational History  Not on file  Tobacco Use   Smoking status: Never   Smokeless tobacco: Never  Vaping Use   Vaping status: Never Used  Substance and Sexual Activity   Alcohol  use: Yes    Comment: social    Drug use: No   Sexual activity: Yes    Birth control/protection: Abstinence, None  Other Topics Concern   Not on file  Social History Narrative   Not on file   Social Drivers of Health   Financial Resource Strain: Low Risk  (11/02/2023)   Received from [facility]   Overall Financial Resource Strain (CARDIA)    Difficulty of Paying Living Expenses: Not hard at all  Food Insecurity: No Food Insecurity (06/20/2024)   Received from [facility]   Food Insecurity    Within the past 12 months, the food you bought just didn't last and you didn't have money to get more.: Never true  Transportation Needs: No Transportation Needs (06/20/2024)   Received  from [facility]   Transportation    In the past 12 months, has the lack of transportation kept you from medical appointments, meetings, work, or from getting things needed for daily living?: No  Physical Activity: Inactive (09/02/2021)   Received from [facility]   Exercise Vital Sign    On average, how many days per week do you engage in moderate to strenuous exercise (like a brisk walk)?: 0 days    On average, how many minutes do you engage in exercise at this level?: 0 min  Stress: Stress Concern Present (09/02/2021)   Received from [facility] - Occupational Stress Questionnaire    Feeling of Stress : Very much  Social Connections: Unknown (04/23/2022)   Received from [facility]   Social Network    Social Network: Not on file   Past Surgical History:  Procedure Laterality Date   BREAST REDUCTION SURGERY  2006   CESAREAN SECTION     CESAREAN SECTION  2000   Past Medical History:  Diagnosis Date   Allergy    There were no vitals taken for this visit.  Opioid Risk Score:   Fall Risk Score:  `1  Depression screen [facility] 2/9     09/26/2024   11:01 AM 09/19/2024    1:29 PM 03/09/2024    1:11 PM 08/28/2023    9:56 AM 06/11/2021    2:53 PM 01/20/2016   11:27 AM  Depression screen PHQ 2/9  Decreased Interest 0   1 1 0  Down, Depressed, Hopeless 0   1 1 0  PHQ - 2 Score 0   2 2 0  Altered sleeping     1   Tired, decreased energy     1   Change in appetite     0   Feeling bad or failure about yourself      1   Trouble concentrating     1   Moving slowly or fidgety/restless     1   Suicidal thoughts     0   PHQ-9 Score     7   Difficult doing work/chores     Very difficult      Information is confidential and restricted. Go to Review Flowsheets to unlock data.     Review of Systems  Musculoskeletal:  Positive for gait problem.       Foot pain  Neurological:  Positive for dizziness.  All  other systems reviewed  and are negative.      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, vasomotor symptoms fairly mild -continue nortriptyline , change to night -d/c cymbalta   -discussed her pain, that she needs a new script for he compounded cream as hers has crystallized, discussed that she has weaned off the lorazepam , discussed that she has had benefit from dialudid in the past, discussed that she does use CBD products, discussed that she is interested in following with [facility] in case she needs ketamine infusions in the past  -discussed that Butrans can help, discussed belbuca as well   -continue gabapentin , refilled  -discussed that she had spinal cord stimulator placed by [facility] 3 weeks ago, discussed risks of tolerance over time  -discussed Scrambler therapy, recommended that she discuss this with her neurosurgeon as well, discussed cost of the therapy  -discussed that she is pursuing osseointegration in [place]  -discussed risks and benefits of ketamine troches and infusions -discussed that she has a spinal cord stimulator that was just reprogramed   -discussed that she had a flare this weekend  -discussed of her her CRPS, her flare this weekend, ketamine troches versus infusions, discussed that she does not want to go to [facility] in [place], discussed Qutenza, discussed it as a back-up option if surgery is ineffective  -discussed that she does not do well with steroids  -discussed that she has gastritis with NSAIDs  -discussed that Journavx  did not help  -discussed that Nucynta did not help, discussed that she hated Cymbalta  in the past  -discussed that she weaned down to 10mg  of the Pamelor   Prescribed Ketamine 20%, Baclofen  2%, Cyclobenzaprine 2%, Ketoprofen 10%, Gabapentin  20%, Bupivacaine  1%, Amitryptiline 5%, clonidine 0.2% compounded cream  Dispense 240 g  Apply 1-2 g to  affected area 3-4 times per day, discussed that she used it for the first time last night  -discussed that the ketamine caused her to dissociate from her pain and family -discussed that she had 3 daily sessions of ketamine, discussed that she went to [place] because she could not find anyone in in this area.    -discussed mechanism of action of low dose naltrexone as an opioid receptor antagonist which stimulates your body's production of its own natural endogenous opioids, helping to decrease pain. Discussed that it can also decrease T cell response and thus be helpful in decreasing inflammation, and symptoms of brain fog, fatigue, anxiety, depression, and allergies. Discussed that this medication needs to be compounded at a compounding pharmacy and can more expensive. Discussed that I usually start at 1mg  and if this is not providing enough relief then I titrate upward on a monthly basis. 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 She recently underwent transtibial osseointegration surgery on 06/17/2024. Ashley Brandt was previously ambulatory with a traditional transtibial prosthesis, which is now 34 months old.  Her current prosthetic foot and alignable components no longer meet her functional needs following osseointegration. Unlike traditional socket suspension, osseointegration transfers load directly through the skeletal system, making the implant highly sensitive to repetitive vertical impact and rotational shear forces. Ashley Brandt's existing foot cannot provide adequate vertical shock absorption or torsional rotation, resulting in increased discomfort, gait hesitancy, slower walking speed on uneven terrain, and  reduced overall satisfaction and quality of life.  A vertical shock and torsion foot is medically necessary to protect the osseointegration implant by minimizing  repetitive impact and rotational shear forces. These features will improve comfort, confidence, and mobility on uneven surfaces, directly supporting safe community ambulation. Updated alignable components are also required to fine-tune sagittal, coronal, and transverse alignment with the new foot, ensuring proper load distribution and adaptability during rehabilitation. Together, these additions will restore functional independence, reduce implant stress, and enhance overall quality of life.  Ashley Brandt demonstrates the ability and potential for community ambulation with variable cadence, consistent with a K3 functional level. She lives independently in a multi-level home with stairs and ramps, performs household tasks, and intends to return to vocational and recreational activities requiring walking on uneven terrain. She has the cognitive ability, limb strength, range of motion, and cardiovascular health to safely use a transtibial prosthesis. A replacement foot with vertical shock and torsion features, along with updated alignable components, is medically necessary to support her rehabilitation, reduce pain and skin breakdown, and promote safe, independent ambulation and participation in daily and community activities.  -discussed that she was wondering about [facility]  -Discussed Qutenza as an option for neuropathic pain control. Discussed that this is a capsaicin patch, stronger than capsaicin cream. Discussed that it is currently approved for diabetic peripheral neuropathy and post-herpetic neuralgia, but that it has also shown benefit in treating other forms of neuropathy. Provided patient with link to site to learn more about the patch: https://www.clark.biz/. Discussed that the patch would be placed in office and benefits usually last 3 months. Discussed that unintended exposure to capsaicin can cause severe irritation of eyes, mucous membranes, respiratory tract, and skin, but that  Qutenza is a local treatment and does not have the systemic side effects of other nerve medications. Discussed that there may be pain, itching, erythema, and decreased sensory function associated with the application of Qutenza. Side effects usually subside within 1 week. A cold pack of analgesic medications can help with these side effects. Blood pressure can also be increased due to pain associated with administration of the patch.   -Provided with a pain relief journal and discussed that it contains foods and lifestyle tips to naturally help to improve pain. Discussed that these lifestyle strategies are also very good for health unlike some medications which can have negative side effects. Discussed that the act of keeping a journal can be therapeutic and helpful to realize patterns what helps to trigger and alleviate pain.     2) Anxiety: -referred to psychiatry -prescribed buspar  -discussed that she has weaned off the lorazepam   3) Insomnia: -continue CBD gummies -continue meditation -Try to go outside near sunrise -Get exercise during the day.  -Turn off all devices an hour before bedtime.  -Teas that can benefit: chamomile, valerian root, Brahmi (Bacopa) -Can consider over the counter melatonin, magnesium, and/or L-theanine. Melatonin is an anti-oxidant with multiple health benefits. Magnesium is involved in greater than 300 enzymatic reactions in the body and most of us  are deficient as our soil is often depleted. There are 7 different types of magnesium- Bioptemizer's is a supplement with all 7 types, and each has unique benefits. Magnesium can also help with constipation and anxiety.  -Pistachios naturally increase the production of melatonin -Cozy Earth bamboo bed sheets are free from toxic chemicals.  -Tart cherry juice or a tart cherry supplement can improve sleep and soreness post-workout  4) PTSD: -discussed benefits of EMDR therapy  with Tawni Husband   20 minutes spent in  discussion of her pain, that she needs a new script for he compounded cream as hers has crystallized, discussed that she has weaned off the lorazepam , discussed that she has had benefit from dilaudid  in the past, discussed that she does use CBD products, discussed that she is interested in following with [facility] in case she needs ketamine infusions in the past

## 2024-10-07 ENCOUNTER — Encounter (HOSPITAL_BASED_OUTPATIENT_CLINIC_OR_DEPARTMENT_OTHER): Admitting: Physical Medicine and Rehabilitation

## 2024-10-07 DIAGNOSIS — G5771 Causalgia of right lower limb: Secondary | ICD-10-CM | POA: Diagnosis not present

## 2024-10-07 NOTE — Progress Notes (Signed)
 Subjective:    Patient ID: Ashley Brandt, female    DOB: [birth year], 53 y.o.   MRN: [id number]  HPI An audio/video tele-health visit is felt to be the most appropriate encounter for this patient at this time. This is a follow up tele-visit via phone. The patient is at home. MD is at office. Prior to scheduling this appointment, our staff discussed the limitations of evaluation and management by telemedicine and the availability of in-person appointments. The patient expressed understanding and agreed to proceed.   CC: RIght foot /toe pain  83) CRPS 53 year old female referred by her primary care physician for the evaluation of right foot and toe pain.  The patient states that she has 2 types of pain 1 that is on the bottom of the foot right around the area of the surgery the other area is more in the middle of the foot.  She really has few symptoms at the ankle or above.  She notes mild swelling in the right foot sometimes discoloration as well.  It is very hypersensitive to touch particularly on the plantar surface. 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  She was started on Lyrica  and had some side effects and did not wish to continue taking it.  The patient also had a behavioral health evaluation 04/02/2021 diagnosed with insomnia and adjustment disorder with mixed anxiety depressed mood.  She tried Ambien  and melatonin.  She has been on Cymbalta  30 mg up to 60 mg a day.  She is now on gabapentin  600 mg 3 times per day.  The patient has had a second opinion with orthopedic surgeon.  The patient has had an arterial Doppler on 04/23/2021 which was normal.  She is scheduling surgery at [facility] with a peripheral neurosurgeon with plans for resection of neuroma as well as wrapping the end of the neuroma with abdominal epidermis to prevent reforming  -using the torsion foot in PT has been   -she does not like the  ketamine but she does feel that it helped her to walk, she feels dissociated for 1 week after- she felt disconnected from herself and her family  -she started walking and has found the torsion foot very helpful  -scrambler was ineffective  -her leg was really burning this morning  -she is taking Nucynta 50mg  TID, it is being prescribed by [facility]   -she has always been active and started to run a lot more during the pandemic. 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 She has had more pain and less function.   -she wanted a neurosurgeon to place her spinal cord stimulator  -she asks about compounding medication  CRPS has gotten considerably worse, she is currently 3 weeks post-op spinal cord stimulator placement, she does find it helps, but knows it will take time to get used it   -she currently feels a burning pain in her residual limb  -she had muscle reintigration  -has tried ketamine infusions before, she did not love doing them but she did feel that they helped, she is not a fan of tripping and she did feel that when she was coming out of the infusion  -sometimes she feels a burning pain in her foot as well  Pain interferes with sleep at night  -prior to amputation she has tried bisphosphonate infusions, lumbar  sympathetic nerve block, gabapentin , nortriptyline   -she underwent IO surgery and pain worsened after nerve block wore off -she is to start scrambler therapy next week -she will be in [place] for 2 weeks for scrambler therapy -she wants to wean off the gabapentin  but not at this time given her current pain levels -she has been doing journaling and self compassion medication  She is scheduled to have a sympathetic nerve block 1 week from thursday     CLINICAL DATA:  Severe plantar foot pain since foot surgery 3-4 weeks ago for removal of Morton's neuroma (on 01/24/2021).   EXAM: MRI OF THE  RIGHT FOREFOOT WITHOUT CONTRAST   TECHNIQUE: Multiplanar, multisequence MR imaging of the right forefoot was performed. No intravenous contrast was administered.   COMPARISON:  Office foot radiographs 11/12/2020. No previous MRI available.   FINDINGS: Bones/Joint/Cartilage   Mild degenerative changes of the 1st metatarsophalangeal joint with a small subchondral cyst in the 1st metatarsal head and a small joint effusion. No other significant arthropathic changes. There is no evidence of acute fracture, dislocation or avascular necrosis.   Ligaments   The Lisfranc ligament is intact. The collateral ligaments of the metatarsophalangeal joints appear intact.   Muscles and Tendons   There is focal susceptibility artifact within the plantar soft tissues at the level of the 2nd and 3rd metatarsal necks, presumably postsurgical. There is mild surrounding soft tissue edema as well as trace fluid within the flexor tendon sheaths of the 2nd and 3rd digits. The interosseous muscles are mildly edematous. There is additional ill-defined low signal between the 2nd and 3rd metatarsal necks, also likely postsurgical. No drainable fluid collection.   Soft tissues   The plantar soft tissues are deformed by a capsule which was placed along the plantar aspect of the 3rd metatarsal head. As above, there is underlying soft tissue edema of and presumed postsurgical susceptibility artifact. No drainable fluid collection or soft tissue mass identified.   IMPRESSION: 1. Nonspecific soft tissue edema and susceptibility artifact in the plantar forefoot soft tissues, attributed to recent surgery. Possible mild flexor tenosynovitis of the 2nd and 3rd rays. 2. No drainable fluid collection. 3. First MTP joint degenerative changes and small effusion. No acute osseous findings.     Electronically Signed   By: Elsie Perone M.D.   On: 04/08/2021 09:41 Pain Inventory Average Pain 7 Pain Right Now  5-6 My pain is intermittent and constant sharp, burning, and stabbing, aching, tingling  In the last 24 hours, has pain interfered with the following? General activity 10 Relation with others 10 Enjoyment of life 10 What TIME of day is your pain at its worst? evening and night Sleep (in general) poor to Fair  Pain is worse with: walking, sitting, and standing Pain improves with: rest, pacing activities, and medication Relief from Meds: 4  walk without assistance how many minutes can you walk? 5-10 ability to climb steps?  yes do you drive?  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/21 I need assistance with the following:  meal prep, household duties, and shopping  trouble walking dizziness anxiety  New pt  New pt    Family History  Problem Relation Age of Onset   Depression Mother    Diabetes Mellitus II Mother    Heart Problems Father    Social History   Socioeconomic History   Marital status: Married    Spouse name: Not on file   Number of children: Not  on file   Years of education: Not on file   Highest education level: Not on file  Occupational History   Not on file  Tobacco Use   Smoking status: Never   Smokeless tobacco: Never  Vaping Use   Vaping status: Never Used  Substance and Sexual Activity   Alcohol  use: Yes    Comment: social    Drug use: No   Sexual activity: Yes    Birth control/protection: Abstinence, None  Other Topics Concern   Not on file  Social History Narrative   Not on file   Social Drivers of Health   Financial Resource Strain: Low Risk  (11/02/2023)   Received from [facility]   Overall Financial Resource Strain (CARDIA)    Difficulty of Paying Living Expenses: Not hard at all  Food Insecurity: No Food Insecurity (06/20/2024)   Received from [facility]   Food Insecurity    Within the past 12 months, the food you bought just didn't last and you didn't have  money to get more.: Never true  Transportation Needs: No Transportation Needs (06/20/2024)   Received from [facility]   Transportation    In the past 12 months, has the lack of transportation kept you from medical appointments, meetings, work, or from getting things needed for daily living?: No  Physical Activity: Inactive (09/02/2021)   Received from [facility]   Exercise Vital Sign    On average, how many days per week do you engage in moderate to strenuous exercise (like a brisk walk)?: 0 days    On average, how many minutes do you engage in exercise at this level?: 0 min  Stress: Stress Concern Present (09/02/2021)   Received from [facility] - Occupational Stress Questionnaire    Feeling of Stress : Very much  Social Connections: Unknown (04/23/2022)   Received from [facility]   Social Network    Social Network: Not on file   Past Surgical History:  Procedure Laterality Date   BREAST REDUCTION SURGERY  2006   CESAREAN SECTION     CESAREAN SECTION  2000   Past Medical History:  Diagnosis Date   Allergy    There were no vitals taken for this visit.  Opioid Risk Score:   Fall Risk Score:  `1  Depression screen [facility] 2/9     09/26/2024   11:01 AM 09/19/2024    1:29 PM 03/09/2024    1:11 PM 08/28/2023    9:56 AM 06/11/2021    2:53 PM 01/20/2016   11:27 AM  Depression screen PHQ 2/9  Decreased Interest 0   1 1 0  Down, Depressed, Hopeless 0   1 1 0  PHQ - 2 Score 0   2 2 0  Altered sleeping     1   Tired, decreased energy     1   Change in appetite     0   Feeling bad or failure about yourself      1   Trouble concentrating     1   Moving slowly or fidgety/restless     1   Suicidal thoughts     0   PHQ-9 Score     7   Difficult doing work/chores     Very difficult      Information is confidential and restricted. Go to Review Flowsheets to unlock data.     Review of Systems  Musculoskeletal:  Positive  for gait problem.       Foot pain  Neurological:  Positive for dizziness.  All other systems reviewed and are negative.      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, vasomotor symptoms fairly mild -continue nortriptyline , change to night -d/c cymbalta   -discussed her pain, that she is on Nucynta, discussed that she has not tried long term opioid therapy or a pain pump, discussed that she has tried a vagal nerve stimulator  -discussed her pain, that she needs a new script for he compounded cream as hers has crystallized, discussed that she has weaned off the lorazepam , discussed that she has had benefit from dialudid in the past, discussed that she does use CBD products, discussed that she is interested in following with [facility] in case she needs ketamine infusions in the past  -discussed that Butrans can help, discussed belbuca as well   -continue gabapentin , refilled  -discussed that she had spinal cord stimulator placed by [facility] 3 weeks ago, discussed risks of tolerance over time  -discussed Scrambler therapy, recommended that she discuss this with her neurosurgeon as well, discussed cost of the therapy  -discussed that she is pursuing osseointegration in [place]  -discussed risks and benefits of ketamine troches and infusions -discussed that she has a spinal cord stimulator that was just reprogramed   -discussed that she had a flare this weekend  -discussed of her her CRPS, her flare this weekend, ketamine troches versus infusions, discussed that she does not want to go to [facility] in [place], discussed Qutenza, discussed it as a back-up option if surgery is ineffective  -discussed that she does not do well with steroids  -discussed that she has gastritis with NSAIDs  -discussed that Journavx  did not help  -discussed that Nucynta did not help, discussed that she  hated Cymbalta  in the past  -discussed that she weaned down to 10mg  of the Pamelor   Prescribed Ketamine 20%, Baclofen  2%, Cyclobenzaprine 2%, Ketoprofen 10%, Gabapentin  20%, Bupivacaine  1%, Amitryptiline 5%, clonidine 0.2% compounded cream  Dispense 240 g  Apply 1-2 g to affected area 3-4 times per day, discussed that she used it for the first time last night  -discussed that the ketamine caused her to dissociate from her pain and family -discussed that she had 3 daily sessions of ketamine, discussed that she went to [place] because she could not find anyone in in this area.    -discussed mechanism of action of low dose naltrexone as an opioid receptor antagonist which stimulates your body's production of its own natural endogenous opioids, helping to decrease pain. Discussed that it can also decrease T cell response and thus be helpful in decreasing inflammation, and symptoms of brain fog, fatigue, anxiety, depression, and allergies. Discussed that this medication needs to be compounded at a compounding pharmacy and can more expensive. Discussed that I usually start at 1mg  and if this is not providing enough relief then I titrate upward on a monthly basis. 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 She recently underwent transtibial osseointegration surgery on 06/17/2024. Ashley Brandt was previously ambulatory with a traditional transtibial prosthesis, which is now 34 months old.  Her current prosthetic foot and alignable components no longer meet her  functional needs following osseointegration. Unlike traditional socket suspension, osseointegration transfers load directly through the skeletal system, making the implant highly sensitive to repetitive vertical impact and rotational shear forces. Ashley Brandt's existing foot cannot provide adequate vertical shock absorption or  torsional rotation, resulting in increased discomfort, gait hesitancy, slower walking speed on uneven terrain, and reduced overall satisfaction and quality of life.  A vertical shock and torsion foot is medically necessary to protect the osseointegration implant by minimizing repetitive impact and rotational shear forces. These features will improve comfort, confidence, and mobility on uneven surfaces, directly supporting safe community ambulation. Updated alignable components are also required to fine-tune sagittal, coronal, and transverse alignment with the new foot, ensuring proper load distribution and adaptability during rehabilitation. Together, these additions will restore functional independence, reduce implant stress, and enhance overall quality of life.  Ashley Brandt demonstrates the ability and potential for community ambulation with variable cadence, consistent with a K3 functional level. She lives independently in a multi-level home with stairs and ramps, performs household tasks, and intends to return to vocational and recreational activities requiring walking on uneven terrain. She has the cognitive ability, limb strength, range of motion, and cardiovascular health to safely use a transtibial prosthesis. A replacement foot with vertical shock and torsion features, along with updated alignable components, is medically necessary to support her rehabilitation, reduce pain and skin breakdown, and promote safe, independent ambulation and participation in daily and community activities.  -discussed that she was wondering about [facility]  -Discussed Qutenza as an option for neuropathic pain control. Discussed that this is a capsaicin patch, stronger than capsaicin cream. Discussed that it is currently approved for diabetic peripheral neuropathy and post-herpetic neuralgia, but that it has also shown benefit in treating other forms of neuropathy. Provided patient with link to site to learn  more about the patch: https://www.clark.biz/. Discussed that the patch would be placed in office and benefits usually last 3 months. Discussed that unintended exposure to capsaicin can cause severe irritation of eyes, mucous membranes, respiratory tract, and skin, but that Qutenza is a local treatment and does not have the systemic side effects of other nerve medications. Discussed that there may be pain, itching, erythema, and decreased sensory function associated with the application of Qutenza. Side effects usually subside within 1 week. A cold pack of analgesic medications can help with these side effects. Blood pressure can also be increased due to pain associated with administration of the patch.   -Provided with a pain relief journal and discussed that it contains foods and lifestyle tips to naturally help to improve pain. Discussed that these lifestyle strategies are also very good for health unlike some medications which can have negative side effects. Discussed that the act of keeping a journal can be therapeutic and helpful to realize patterns what helps to trigger and alleviate pain.     2) Anxiety: -referred to psychiatry -prescribed buspar  -discussed that she has weaned off the lorazepam   3) Insomnia: -continue CBD gummies -continue meditation -Try to go outside near sunrise -Get exercise during the day.  -Turn off all devices an hour before bedtime.  -Teas that can benefit: chamomile, valerian root, Brahmi (Bacopa) -Can consider over the counter melatonin, magnesium, and/or L-theanine. Melatonin is an anti-oxidant with multiple health benefits. Magnesium is involved in greater than 300 enzymatic reactions in the body and most of us  are deficient as our soil is often depleted. There are 7 different types of magnesium- Bioptemizer's is a supplement with all 7 types,  and each has unique benefits. Magnesium can also help with constipation and anxiety.  -Pistachios naturally increase  the production of melatonin -[employer]zy [employer] bamboo bed sheets are free from toxic chemicals.  -Tart cherry juice or a tart cherry supplement can improve sleep and soreness post-workout  4) PTSD: -discussed benefits of EMDR therapy with Tawni Husband   5 minutes spent in discussion of her pain, that she is on Nucynta, discussed that she has not tried long term opioid therapy or a pain pump, discussed that she has tried a vagal nerve stimulator, encouraged discussing with her [facility] pain team increasing dose of Nucynta

## 2024-10-17 ENCOUNTER — Ambulatory Visit: Admitting: Licensed Clinical Social Worker

## 2024-10-21 ENCOUNTER — Ambulatory Visit: Admitting: Licensed Clinical Social Worker

## 2024-10-21 DIAGNOSIS — F4322 Adjustment disorder with anxiety: Secondary | ICD-10-CM | POA: Diagnosis not present

## 2024-10-21 DIAGNOSIS — G4700 Insomnia, unspecified: Secondary | ICD-10-CM

## 2024-10-21 NOTE — Progress Notes (Signed)
 THERAPIST PROGRESS NOTE  Virtual Visit via Video Note  I connected with Ashley Brandt on 10/21/24 at 9:07am by a video enabled telemedicine application and verified that I am speaking with the correct person using two identifiers.  Location: Patient: Address on file  Provider: Providers Address   I discussed the limitations of evaluation and management by telemedicine and the availability of in person appointments. The patient expressed understanding and agreed to proceed.  I discussed the assessment and treatment plan with the patient. The patient was provided an opportunity to ask questions and all were answered. The patient agreed with the plan and demonstrated an understanding of the instructions.   The patient was advised to call back or seek an in-person evaluation if the symptoms worsen or if the condition fails to improve as anticipated.  I provided 52 minutes of non-face-to-face time during this encounter.   Ashley KATHEE Husband, LCSW   Session Time: 9:07am-10am   Participation Level: Active  Behavioral Response: CasualAlertEuthymic  Type of Therapy: Individual Therapy  Treatment Goals addressed:  Active     Anxiety     LTG: Ashley Brandt will score less than 5 on the Generalized Anxiety Disorder 7 Scale (GAD-7)  (Progressing)     Start:  03/09/24    Expected End:  12/19/24       Goal Note     09/19/24: I feel like my anxiety has improved. Last week, my anxiety was noticeable. I could feel my breath being shallow and I was sensitive to unexpected noises.          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 Shares this has improved since 09/14/24.         Work with patient individually to  identify the major components of a recent episode of anxiety: physical symptoms, major thoughts and images, and major behaviors they experienced     Start:  03/09/24         Perform motivational interviewing regarding physical activity     Start:  03/09/24         Coping Skills      Start:  03/09/24       Will work with the pt using CBT/DBT techniques to help the pt verbalize an understanding of the cognitive, physiological, and behavioral components of anxiety and its treatment. This will be done by using worksheets, interactive activities, CBT/ABC thought logs, modeling, homework, role playing and journaling. Will work with pt to learn and implement coping skills that result in a reduction of anxiety and improve daily functioning per pt self-report 3 out of 5 documented sessions.         BH CCP Acute or Chronic Trauma Reaction     LTG: Elimination of maladaptive behaviors and thinking patterns which interfere with resolution of trauma as evidenced by self report (Progressing)     Start:  03/09/24    Expected End:  12/19/24         LTG: Recall traumatic events without becoming overwhelmed with negative emotions (Progressing)     Start:  03/09/24    Expected End:  12/19/24         STG: Pt reports she would like  to learn how to set boundaries with her mother around religious beliefs (Progressing)     Start:  03/09/24    Expected End:  12/19/24         LTG: Address childhood trauma centered around religion. 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       Provide Ashley Brandt with education on trauma-oriented therapy     Start:  03/09/24         Cooperate with trauma-focused psychotherapy techniques to reduce emotional reaction to the traumatic event      Start:  03/09/24         Teach Ashley Brandt coping  strategies (e.g., writing down thoughts and feelings in a journal; taking deep, slow breaths; calling a support person to talk about memories) to deal with trauma memories and sudden emotional reactions without becoming emotionally n     Start:  03/09/24         Educate Ashley Brandt on trauma influenced cognitive distortions     Start:  03/09/24         Encourage Ashley Brandt to identify 2 trauma related cognitive distortions     Start:  03/09/24            ProgressTowards Goals: Progressing  Interventions: CBT, Supportive, and Reframing  Summary: Ashley Brandt is a 53 y.o. female who presents with sxs to include but not limited to Change in energy/activity; Difficulty Concentrating; Fatigue; Hopelessness; Increase/decrease in appetite; Irritability; Tearfulness; Worthlessness; Restlessness; Sleep; Tension; Worrying; Irritability/anger; Guilt/shame. Pt was oriented times 5. Pt was cooperative and engaged. Pt denies SI/HI/AVH.   Reports from last Saturday indicate that she had taken 9,000 steps. She has been discovering holistic approaches to manage her pain, which has improved by up to 75%. She is connecting with other patients and engaging in similar alternative treatments.  Her intention is to improve both her physical and emotional comfort. The window of tolerance has expanded due to neuromodulation drills, which have increased her ability to manage her pain and mental health. She is also dealing with lymphedema and utilizing mindfulness to control factors that help calm her parasympathetic nervous system.  She has been participating in social outings, cooking, walking around, standing, and mingling without the need to sit immediately due to pain. She is accomplishing projects around the home that she feels have been neglected, and she is able to be more present now that her chronic pain has subsided.  She has developed a new perspective, acknowledging that bad days are just a part of  life. Additionally, she has successfully weaned herself off lorazepam  since October 9th.  Suicidal/Homicidal: Nowithout intent/plan  Therapist Response: Clinician utilized active and supportive reflection to create a safe space for patient to process recent life events. Clinician assessed for current stressors, symptoms, safety since last session. Reflected on ways the patient is utilizing mindfulness and CBT to reframe negative thoughts about her pain and coping. Reflected on her ability to be present since beginning this holistic regimen.   Plan: Return again in 2 weeks.  Diagnosis: Adjustment disorder with anxious mood  Insomnia, unspecified type   Collaboration of Care: [facility] psychiatrist can access notes and cln. Will review psychiatrists' notes. Check in with the patient and  will see LCSW per availability. Patient agreed with treatment recommendations.   Patient/Guardian was advised Release of Information must be obtained prior to any record release in order to collaborate their care with an outside provider. Patient/Guardian was advised if they have not already done so to contact the registration department to sign all necessary forms in order for us  to release information regarding their care.   Consent: Patient/Guardian gives verbal consent for treatment and assignment of benefits for services provided during this visit. Patient/Guardian expressed understanding and agreed to proceed.   Ashley KATHEE Husband, LCSW 10/21/2024 b

## 2024-10-25 ENCOUNTER — Encounter: Payer: Self-pay | Admitting: Licensed Clinical Social Worker

## 2024-11-02 ENCOUNTER — Telehealth: Admitting: Psychiatry

## 2024-11-04 ENCOUNTER — Ambulatory Visit: Admitting: Licensed Clinical Social Worker

## 2024-11-07 ENCOUNTER — Ambulatory Visit (INDEPENDENT_AMBULATORY_CARE_PROVIDER_SITE_OTHER): Admitting: Licensed Clinical Social Worker

## 2024-11-07 DIAGNOSIS — G4700 Insomnia, unspecified: Secondary | ICD-10-CM | POA: Diagnosis not present

## 2024-11-07 DIAGNOSIS — F4322 Adjustment disorder with anxiety: Secondary | ICD-10-CM

## 2024-11-07 NOTE — Progress Notes (Signed)
 THERAPIST PROGRESS NOTE  Virtual Visit via Video Note  I connected with Ashley Brandt on 11/07/24 at 4:02pm by a video enabled telemedicine application and verified that I am speaking with the correct person using two identifiers.  Location: Patient: Address on file  Provider: Providers Address   I discussed the limitations of evaluation and management by telemedicine and the availability of in person appointments. The patient expressed understanding and agreed to proceed.    I discussed the assessment and treatment plan with the patient. The patient was provided an opportunity to ask questions and all were answered. The patient agreed with the plan and demonstrated an understanding of the instructions.   The patient was advised to call back or seek an in-person evaluation if the symptoms worsen or if the condition fails to improve as anticipated.  I provided 57 minutes of non-face-to-face time during this encounter.   [name], LCSW   Session Time: 4:02pm-4:59pm  Participation Level: Active  Behavioral Response: CasualAlertEuthymic  Type of Therapy: Individual Therapy  Treatment Goals addressed:  Active     Anxiety     LTG: Ashley Brandt will score less than 5 on the Generalized Anxiety Disorder 7 Scale (GAD-7)  (Progressing)     Start:  03/09/24    Expected End:  12/19/24       Goal Note     09/19/24: I feel like my anxiety has improved. Last week, my anxiety was noticeable. I could feel my breath being shallow and I was sensitive to unexpected noises.          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 Shares this has improved since 09/14/24.         Work with patient individually to  identify the major components of a recent episode of anxiety: physical symptoms, major thoughts and images, and major behaviors they experienced     Start:  03/09/24         Perform motivational interviewing regarding physical activity     Start:  03/09/24         Coping Skills      Start:  03/09/24       Will work with the pt using CBT/DBT techniques to help the pt verbalize an understanding of the cognitive, physiological, and behavioral components of anxiety and its treatment. This will be done by using worksheets, interactive activities, CBT/ABC thought logs, modeling, homework, role playing and journaling. Will work with pt to learn and implement coping skills that result in a reduction of anxiety and improve daily functioning per pt self-report 3 out of 5 documented sessions.         BH CCP Acute or Chronic Trauma Reaction     LTG: Elimination of maladaptive behaviors and thinking patterns which interfere with resolution of trauma as evidenced by self report (Progressing)     Start:  03/09/24    Expected End:  12/19/24         LTG: Recall traumatic events without becoming overwhelmed with negative emotions (Progressing)     Start:  03/09/24    Expected End:  12/19/24         STG: Pt reports she would  like to learn how to set boundaries with her mother around religious beliefs (Progressing)     Start:  03/09/24    Expected End:  12/19/24         LTG: Address childhood trauma centered around religion. 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       Provide Ashley Brandt with education on trauma-oriented therapy     Start:  03/09/24         Cooperate with trauma-focused psychotherapy techniques to reduce emotional reaction to the traumatic event      Start:  03/09/24         Teach Ashley Brandt coping  strategies (e.g., writing down thoughts and feelings in a journal; taking deep, slow breaths; calling a support person to talk about memories) to deal with trauma memories and sudden emotional reactions without becoming emotionally n     Start:  03/09/24         Educate Ashley Brandt on trauma influenced cognitive distortions     Start:  03/09/24         Encourage Ashley Brandt to identify 2 trauma related cognitive distortions     Start:  03/09/24            ProgressTowards Goals: Progressing  Interventions: CBT, Supportive, and Reframing  Summary: Ashley Brandt is a 53 y.o. female who presents with sxs to include but not limited to Change in energy/activity; Difficulty Concentrating; Fatigue; Hopelessness; Increase/decrease in appetite; Irritability; Tearfulness; Worthlessness; Restlessness; Sleep; Tension; Worrying; Irritability/anger; Guilt/shame. Pt was oriented times 5. Pt was cooperative and engaged. Pt denies SI/HI/AVH.   Following a recent holistic treatment plan for pain patient reports grief surrounding her father and mother emerged. Reports treatment was successful in provided pain relief.   She reflected on her emotional exhaustion and burnout from work, noting that she has had a hard time prioritizing herself. Her people-pleasing love language is words of affirmation, which has led to neglecting her physical training and self-care due to work commitments.   Her son is coming home, and she is walking more--almost every day. Now, she has tools for movement that help reset her brain and lower her pain.  Regarding her grief about her dad, she feels she was unable to safely express her emotions. "I lost the parent I feel an energetic match with." She reports significant progress in processing his death but struggles with feelings of being distant from her mother. Reports she has progressed in accepting her mother's perceived grief is not her burden to carry.   Her anxiety often  manifests as "a lot of internal chatter" filled with uncontrollable worry. Reflected on ways she can challenge her worry.  Using Cognitive Behavioral Therapy (CBT), she's been able to challenge her worries and move away from black-and-white thinking that paints her as a failure.   She has stopped using her wheelchair and is finding her way back to trying new things. This has helped her regain confidence.   Suicidal/Homicidal: Nowithout intent/plan  Therapist Response: Clinician utilized active and supportive reflection to create a safe space for patient to process recent life events. Clinician assessed for current stressors, symptoms, safety since last session. Reflected on ways the patient is utilizing mindfulness and CBT  to reframe negative thoughts about her pain and coping. Reflected on her ability to be present since beginning this holistic regimen.   Plan: Return again in 2 weeks.  Diagnosis: Adjustment disorder with anxious mood  Insomnia, unspecified type   Collaboration of Care: [facility]EB psychiatrist can access notes and cln. Will review psychiatrists' notes. Check in with the patient and will see LCSW per availability. Patient agreed with treatment recommendations.   Patient/Guardian was advised Release of Information must be obtained prior to any record release in order to collaborate their care with an outside provider. Patient/Guardian was advised if they have not already done so to contact the registration department to sign all necessary forms in order for us  to release information regarding their care.   Consent: Patient/Guardian gives verbal consent for treatment and assignment of benefits for services provided during this visit. Patient/Guardian expressed understanding and agreed to proceed.   [name], LCSW 11/07/2024

## 2024-11-09 DIAGNOSIS — Z79891 Long term (current) use of opiate analgesic: Secondary | ICD-10-CM | POA: Diagnosis not present

## 2024-11-09 DIAGNOSIS — G90521 Complex regional pain syndrome I of right lower limb: Secondary | ICD-10-CM | POA: Diagnosis not present

## 2024-11-15 ENCOUNTER — Encounter: Payer: Self-pay | Admitting: Licensed Clinical Social Worker

## 2024-11-16 ENCOUNTER — Encounter: Attending: Physical Medicine and Rehabilitation | Admitting: Physical Medicine and Rehabilitation

## 2024-11-16 ENCOUNTER — Encounter: Payer: Self-pay | Admitting: Physical Medicine and Rehabilitation

## 2024-11-16 DIAGNOSIS — N951 Menopausal and female climacteric states: Secondary | ICD-10-CM | POA: Diagnosis not present

## 2024-11-16 DIAGNOSIS — G5771 Causalgia of right lower limb: Secondary | ICD-10-CM | POA: Insufficient documentation

## 2024-11-17 MED ORDER — BUPRENORPHINE 5 MCG/HR TD PTWK: 1.0000 | MEDICATED_PATCH | TRANSDERMAL | 0 refills | Status: DC

## 2024-11-17 MED ORDER — PREGABALIN 25 MG PO CAPS: 25.0000 mg | ORAL_CAPSULE | Freq: Two times a day (BID) | ORAL | 3 refills | Status: AC

## 2024-11-17 NOTE — Progress Notes (Signed)
 Subjective:    Patient ID: Ashley Brandt, female    DOB: 1971-05-28, 53 y.o.   MRN: [id number]  HPI An audio/video tele-health visit is felt to be the most appropriate encounter for this patient at this time. This is a follow up tele-visit via phone. The patient is at home. MD is at office. Prior to scheduling this appointment, our staff discussed the limitations of evaluation and management by telemedicine and the availability of in-person appointments. The patient expressed understanding and agreed to proceed.   CC: RIght foot /toe pain  80) CRPS 53 year old female referred by her primary care physician for the evaluation of right foot and toe pain.  The patient states that she has 2 types of pain 1 that is on the bottom of the foot right around the area of the surgery the other area is more in the middle of the foot.  She really has few symptoms at the ankle or above.  She notes mild swelling in the right foot sometimes discoloration as well.  It is very hypersensitive to touch particularly on the plantar surface. 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  She was started on Lyrica  and had some side effects and did not wish to continue taking it.  The patient also had a behavioral health evaluation 04/02/2021 diagnosed with insomnia and adjustment disorder with mixed anxiety depressed mood.  She tried Ambien  and melatonin.  She has been on Cymbalta  30 mg up to 60 mg a day.  She is now on gabapentin  600 mg 3 times per day.  The patient has had a second opinion with orthopedic surgeon.  The patient has had an arterial Doppler on 04/23/2021 which was normal.  She is scheduling surgery at [facility] with a peripheral neurosurgeon with plans for resection of neuroma as well as wrapping the end of the neuroma with abdominal epidermis to prevent reforming  -using the torsion foot in PT has been   -she asks about Butrans   patch, Lyrica   -pain has been severe recently  -she does not like the ketamine but she does feel that it helped her to walk, she feels dissociated for 1 week after- she felt disconnected from herself and her family  -she started walking and has found the torsion foot very helpful  -scrambler was ineffective  -her leg was really burning this morning  -she is taking Nucynta 50mg  TID, it is being prescribed by [facility]   -she has always been active and started to run a lot more during the pandemic. 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 She has had more pain and less function.   -she wanted a neurosurgeon to place her spinal cord stimulator  -she asks about compounding medication  CRPS has gotten considerably worse, she is currently 3 weeks post-op spinal cord stimulator placement, she does find it helps, but knows it will take time to get used it   -she currently feels a burning pain in her residual limb  -she had muscle reintigration  -has tried ketamine infusions before, she did not love doing them but she did feel that they helped, she is not a fan of tripping and she did feel that when she was coming out of the infusion  -sometimes she feels a burning pain in her foot as well  Pain interferes with  sleep at night  -prior to amputation she has tried bisphosphonate infusions, lumbar sympathetic nerve block, gabapentin , nortriptyline   -she underwent IO surgery and pain worsened after nerve block wore off -she is to start scrambler therapy next week -she will be in [place] for 2 weeks for scrambler therapy -she wants to wean off the gabapentin  but not at this time given her current pain levels -she has been doing journaling and self compassion medication  She is scheduled to have a sympathetic nerve block 1 week from thursday     CLINICAL DATA:  Severe plantar foot pain since foot surgery 3-4 weeks  ago for removal of Morton's neuroma (on 01/24/2021).   EXAM: MRI OF THE RIGHT FOREFOOT WITHOUT CONTRAST   TECHNIQUE: Multiplanar, multisequence MR imaging of the right forefoot was performed. No intravenous contrast was administered.   COMPARISON:  Office foot radiographs 11/12/2020. No previous MRI available.   FINDINGS: Bones/Joint/Cartilage   Mild degenerative changes of the 1st metatarsophalangeal joint with a small subchondral cyst in the 1st metatarsal head and a small joint effusion. No other significant arthropathic changes. There is no evidence of acute fracture, dislocation or avascular necrosis.   Ligaments   The Lisfranc ligament is intact. The collateral ligaments of the metatarsophalangeal joints appear intact.   Muscles and Tendons   There is focal susceptibility artifact within the plantar soft tissues at the level of the 2nd and 3rd metatarsal necks, presumably postsurgical. There is mild surrounding soft tissue edema as well as trace fluid within the flexor tendon sheaths of the 2nd and 3rd digits. The interosseous muscles are mildly edematous. There is additional ill-defined low signal between the 2nd and 3rd metatarsal necks, also likely postsurgical. No drainable fluid collection.   Soft tissues   The plantar soft tissues are deformed by a capsule which was placed along the plantar aspect of the 3rd metatarsal head. As above, there is underlying soft tissue edema of and presumed postsurgical susceptibility artifact. No drainable fluid collection or soft tissue mass identified.   IMPRESSION: 1. Nonspecific soft tissue edema and susceptibility artifact in the plantar forefoot soft tissues, attributed to recent surgery. Possible mild flexor tenosynovitis of the 2nd and 3rd rays. 2. No drainable fluid collection. 3. First MTP joint degenerative changes and small effusion. No acute osseous findings.     Electronically Signed   By: Elsie Perone  M.D.   On: 04/08/2021 09:41 Pain Inventory Average Pain 7 Pain Right Now 5-6 My pain is intermittent and constant sharp, burning, and stabbing, aching, tingling  In the last 24 hours, has pain interfered with the following? General activity 10 Relation with others 10 Enjoyment of life 10 What TIME of day is your pain at its worst? evening and night Sleep (in general) poor to Fair  Pain is worse with: walking, sitting, and standing Pain improves with: rest, pacing activities, and medication Relief from Meds: 4  walk without assistance how many minutes can you walk? 5-10 ability to climb steps?  yes do you drive?  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/21 I need assistance with the following:  meal prep, household duties, and shopping  trouble walking dizziness anxiety  2) Hot flashes: -her last period was in June 2024 She has started having severe hot flashes -she had her estrogen levels checked and they were low -she has follow-up scheduled for potential hormone replacement therapy -she read that gabapentin  and nucynta can decreased estrogen  Family History  Problem Relation Age of Onset   Depression Mother    Diabetes Mellitus II Mother    Heart Problems Father    Social History   Socioeconomic History   Marital status: Married    Spouse name: Not on file   Number of children: Not on file   Years of education: Not on file   Highest education level: Not on file  Occupational History   Not on file  Tobacco Use   Smoking status: Never   Smokeless tobacco: Never  Vaping Use   Vaping status: Never Used  Substance and Sexual Activity   Alcohol  use: Yes    Comment: social    Drug use: No   Sexual activity: Yes    Birth control/protection: Abstinence, None  Other Topics Concern   Not on file  Social History Narrative   Not on file   Social Drivers of Health   Financial Resource Strain: Low Risk  (11/02/2023)   Received  [facility]   Overall Financial Resource Strain (CARDIA)    Difficulty of Paying Living Expenses: Not hard at all  Food Insecurity: No Food Insecurity (06/20/2024)   Received from [facility]   Food Insecurity    Within the past 12 months, the food you bought just didn't last and you didn't have money to get more.: Never true  Transportation Needs: No Transportation Needs (06/20/2024)   Received from [facility]   Transportation    In the past 12 months, has the lack of transportation kept you from medical appointments, meetings, work, or from getting things needed for daily living?: No  Physical Activity: Inactive (09/02/2021)   Received from [facility]   Exercise Vital Sign    On average, how many days per week do you engage in moderate to strenuous exercise (like a brisk walk)?: 0 days    On average, how many minutes do you engage in exercise at this level?: 0 min  Stress: Stress Concern Present (09/02/2021)   Received from [facility] - Occupational Stress Questionnaire    Feeling of Stress : Very much  Social Connections: Moderately Isolated (09/02/2021)   Received from [facility]   Social Connection and Isolation Panel    In a typical week, how many times do you talk on the phone with family, friends, or neighbors?: Three times a week    How often do you get together with friends or relatives?: Twice a week    How often do you attend church or religious services?: Never    Do you belong to any clubs or organizations such as church groups, unions, fraternal or athletic groups, or school groups?: No    How often do you attend meetings of the clubs or organizations you belong to?: Never    Are you married, widowed, divorced, separated, never married, or living with a partner?: Married   Past Surgical History:  Procedure Laterality Date   BREAST REDUCTION SURGERY  2006   CESAREAN  SECTION     CESAREAN SECTION  2000   Past Medical History:  Diagnosis Date   Allergy    There were no vitals taken for this visit.  Opioid Risk Score:   Fall Risk Score:  `1  Depression screen PHQ 2/9     09/26/2024   11:01 AM 09/19/2024    1:29 PM 03/09/2024    1:11 PM 08/28/2023    9:56 AM 06/11/2021  2:53 PM 01/20/2016   11:27 AM  Depression screen PHQ 2/9  Decreased Interest 0   1 1 0  Down, Depressed, Hopeless 0   1 1 0  PHQ - 2 Score 0   2 2 0  Altered sleeping     1   Tired, decreased energy     1   Change in appetite     0   Feeling bad or failure about yourself      1   Trouble concentrating     1   Moving slowly or fidgety/restless     1   Suicidal thoughts     0   PHQ-9 Score     7    Difficult doing work/chores     Very difficult      Information is confidential and restricted. Go to Review Flowsheets to unlock data.   Data saved with a previous flowsheet row definition     Review of Systems  Musculoskeletal:  Positive for gait problem.       Foot pain  Neurological:  Positive for dizziness.  All other systems reviewed and are negative.      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, vasomotor symptoms fairly mild -continue nortriptyline , change to night -d/c cymbalta  -d/c nucynta, discussed that she is on low dose, takes twice per day, does not need to wean off -prescribed lyrica  25mg  BID -discussed her desire to wean off gabapentin , discussed that she can try skipping morning 800mg  dose or I can send her 600mg  to take in the morning instead -prescribed Butrans  patch -discussed that she has read that gabapentin  and Nucynta can decrease estrogen levels  -discussed her pain, that she is on Nucynta, discussed that she has not tried long term opioid therapy or a pain pump, discussed that she has tried a vagal nerve stimulator  -discussed her pain, that  she needs a new script for he compounded cream as hers has crystallized, discussed that she has weaned off the lorazepam , discussed that she has had benefit from dialudid in the past, discussed that she does use CBD products, discussed that she is interested in following with [facility] and [facility] in case she needs ketamine infusions in the past  -discussed that Butrans  can help, discussed belbuca  as well   -continue gabapentin , refilled  -discussed that she had spinal cord stimulator placed by [facility] 3 weeks ago, discussed risks of tolerance over time  -discussed Scrambler therapy, recommended that she discuss this with her neurosurgeon as well, discussed cost of the therapy  -discussed that she is pursuing osseointegration in [place]  -discussed risks and benefits of ketamine troches and infusions -discussed that she has a spinal cord stimulator that was just reprogramed   -discussed that she had a flare this weekend  -discussed of her her CRPS, her flare this weekend, ketamine troches versus infusions, discussed that she does not want to go to [facility] in [place], discussed Qutenza, discussed it as a back-up option if surgery is ineffective  -discussed that she does not do well with steroids  -discussed that she has gastritis with NSAIDs  -discussed that Journavx  did not help  -discussed that Nucynta did not help, discussed that she hated Cymbalta  in the past  -discussed that she weaned down to 10mg  of the Pamelor   Prescribed Ketamine 20%, Baclofen  2%, Cyclobenzaprine 2%,  Ketoprofen 10%, Gabapentin  20%, Bupivacaine  1%, Amitryptiline 5%, clonidine 0.2% compounded cream  Dispense 240 g  Apply 1-2 g to affected area 3-4 times per day, discussed that she used it for the first time last night  -discussed that the ketamine caused her to dissociate from her pain and family -discussed that she had 3 daily sessions of ketamine, discussed that she went to [place] because she could not find  anyone in in this area.    -discussed mechanism of action of low dose naltrexone as an opioid receptor antagonist which stimulates your body's production of its own natural endogenous opioids, helping to decrease pain. Discussed that it can also decrease T cell response and thus be helpful in decreasing inflammation, and symptoms of brain fog, fatigue, anxiety, depression, and allergies. Discussed that this medication needs to be compounded at a compounding pharmacy and can more expensive. Discussed that I usually start at 1mg  and if this is not providing enough relief then I titrate upward on a monthly basis. 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 She recently underwent transtibial osseointegration surgery on 06/17/2024. Ashley Brandt was previously ambulatory with a traditional transtibial prosthesis, which is now 54 months old.  Her current prosthetic foot and alignable components no longer meet her functional needs following osseointegration. Unlike traditional socket suspension, osseointegration transfers load directly through the skeletal system, making the implant highly sensitive to repetitive vertical impact and rotational shear forces. Ashley Brandt's existing foot cannot provide adequate vertical shock absorption or torsional rotation, resulting in increased discomfort, gait hesitancy, slower walking speed on uneven terrain, and reduced overall satisfaction and quality of life.  A vertical shock and torsion foot is medically necessary to protect the osseointegration implant by minimizing repetitive impact and rotational shear forces. These features will improve comfort, confidence, and mobility on uneven surfaces, directly supporting safe community ambulation. Updated alignable components are also required to fine-tune sagittal, coronal, and transverse alignment with the  new foot, ensuring proper load distribution and adaptability during rehabilitation. Together, these additions will restore functional independence, reduce implant stress, and enhance overall quality of life.  Ashley Brandt demonstrates the ability and potential for community ambulation with variable cadence, consistent with a K3 functional level. She lives independently in a multi-level home with stairs and ramps, performs household tasks, and intends to return to vocational and recreational activities requiring walking on uneven terrain. She has the cognitive ability, limb strength, range of motion, and cardiovascular health to safely use a transtibial prosthesis. A replacement foot with vertical shock and torsion features, along with updated alignable components, is medically necessary to support her rehabilitation, reduce pain and skin breakdown, and promote safe, independent ambulation and participation in daily and community activities.  -discussed that she was wondering about [facility]  -Discussed Qutenza as an option for neuropathic pain control. Discussed that this is a capsaicin patch, stronger than capsaicin cream. Discussed that it is currently approved for diabetic peripheral neuropathy and post-herpetic neuralgia, but that it has also shown benefit in treating other forms of neuropathy. Provided patient with link to site to learn more about the patch: https://www.clark.biz/. Discussed that the patch would be placed in office and benefits usually last 3 months. Discussed that unintended exposure to capsaicin can cause severe irritation of eyes, mucous membranes, respiratory tract, and skin, but that Qutenza is a local treatment and does not have the systemic side effects  of other nerve medications. Discussed that there may be pain, itching, erythema, and decreased sensory function associated with the application of Qutenza. Side effects usually subside within 1 week. A cold pack of  analgesic medications can help with these side effects. Blood pressure can also be increased due to pain associated with administration of the patch.   -Provided with a pain relief journal and discussed that it contains foods and lifestyle tips to naturally help to improve pain. Discussed that these lifestyle strategies are also very good for health unlike some medications which can have negative side effects. Discussed that the act of keeping a journal can be therapeutic and helpful to realize patterns what helps to trigger and alleviate pain.     2) Anxiety: -referred to psychiatry -prescribed buspar  -discussed that she has weaned off the lorazepam   3) Insomnia: -continue CBD gummies -continue meditation -Try to go outside near sunrise -Get exercise during the day.  -Turn off all devices an hour before bedtime.  -Teas that can benefit: chamomile, valerian root, Brahmi (Bacopa) -Can consider over the counter melatonin, magnesium, and/or L-theanine. Melatonin is an anti-oxidant with multiple health benefits. Magnesium is involved in greater than 300 enzymatic reactions in the body and most of us  are deficient as our soil is often depleted. There are 7 different types of magnesium- Bioptemizer's is a supplement with all 7 types, and each has unique benefits. Magnesium can also help with constipation and anxiety.  -Pistachios naturally increase the production of melatonin -Cozy Earth bamboo bed sheets are free from toxic chemicals.  -Tart cherry juice or a tart cherry supplement can improve sleep and soreness post-workout  4) PTSD: -discussed benefits of EMDR therapy with Christina Perkins   5) Hot flashes: -discussed that black cohosh, Effexor can be beneficial -discussed that her estrogen levels are low and she has f/u scheduled for potential hormone replacement therapy -discussed that she has read gabapentin  and nucynta can lower estroge -advised d/c of Nucynta since she does not find  this helpful -advised possible wean of gabapentin  to 800mg  BID or 600/800/800mg 

## 2024-11-21 ENCOUNTER — Telehealth: Payer: Self-pay | Admitting: *Deleted

## 2024-11-21 NOTE — Telephone Encounter (Signed)
 Approved today by [facility] 2017 Approved. Effective Date: 11/21/2024 Authorization Expiration Date: 05/20/2025

## 2024-11-21 NOTE — Telephone Encounter (Signed)
 Prior auth initiated for Butrans  patch 5 mcg/hr  Ashley Brandt (Key: [id number]) Your information is being submitted to [employer].

## 2024-11-22 ENCOUNTER — Ambulatory Visit (INDEPENDENT_AMBULATORY_CARE_PROVIDER_SITE_OTHER): Admitting: Licensed Clinical Social Worker

## 2024-11-22 DIAGNOSIS — F4322 Adjustment disorder with anxiety: Secondary | ICD-10-CM

## 2024-11-22 DIAGNOSIS — G4700 Insomnia, unspecified: Secondary | ICD-10-CM

## 2024-11-22 NOTE — Progress Notes (Signed)
 THERAPIST PROGRESS NOTE  Virtual Visit via Video Note  I connected with Ashley Brandt on 11/22/24 at  1:00 PM EST by a video enabled telemedicine application and verified that I am speaking with the correct person using two identifiers.  Location: Patient: Address on file  Provider: [facility]   I discussed the limitations of evaluation and management by telemedicine and the availability of in person appointments. The patient expressed understanding and agreed to proceed.   I discussed the assessment and treatment plan with the patient. The patient was provided an opportunity to ask questions and all were answered. The patient agreed with the plan and demonstrated an understanding of the instructions.   The patient was advised to call back or seek an in-person evaluation if the symptoms worsen or if the condition fails to improve as anticipated.  I provided 58 minutes of non-face-to-face time during this encounter.   [name], LCSW   Session Time: 1-1:58pm  Participation Level: Active  Behavioral Response: CasualAlertHopeless and tearful  Type of Therapy: Individual Therapy  Treatment Goals addressed:  Active     Anxiety     LTG: Ashley Brandt will score less than 5 on the Generalized Anxiety Disorder 7 Scale (GAD-7)  (Progressing)     Start:  03/09/24    Expected End:  12/19/24       Goal Note     09/19/24: I feel like my anxiety has improved. Last week, my anxiety was noticeable. I could feel my breath being shallow and I was sensitive to unexpected noises.          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 Shares this has improved since 09/14/24.         Work with patient individually to  identify the major components of a recent episode of anxiety: physical symptoms, major thoughts and images, and major behaviors they experienced     Start:  03/09/24         Perform motivational interviewing regarding physical activity     Start:  03/09/24         Coping Skills      Start:  03/09/24       Will work with the pt using CBT/DBT techniques to help the pt verbalize an understanding of the cognitive, physiological, and behavioral components of anxiety and its treatment. This will be done by using worksheets, interactive activities, CBT/ABC thought logs, modeling, homework, role playing and journaling. Will work with pt to learn and implement coping skills that result in a reduction of anxiety and improve daily functioning per pt self-report 3 out of 5 documented sessions.         [facility] CCP Acute or Chronic Trauma Reaction     LTG: Elimination of maladaptive behaviors and thinking patterns which interfere with resolution of trauma as evidenced by self report (Progressing)     Start:  03/09/24    Expected End:  12/19/24         LTG: Recall traumatic events without becoming overwhelmed with negative emotions (Progressing)     Start:  03/09/24    Expected End:  12/19/24         STG: Pt  reports she would like to learn how to set boundaries with her mother around religious beliefs (Progressing)     Start:  03/09/24    Expected End:  12/19/24         LTG: Address childhood trauma centered around religion. 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       Provide Ashley Brandt with education on trauma-oriented therapy     Start:  03/09/24         Cooperate with trauma-focused psychotherapy techniques to reduce emotional reaction to the traumatic event      Start:  03/09/24         Teach Ashley Brandt coping  strategies (e.g., writing down thoughts and feelings in a journal; taking deep, slow breaths; calling a support person to talk about memories) to deal with trauma memories and sudden emotional reactions without becoming emotionally n     Start:  03/09/24         Educate Ashley Brandt on trauma influenced cognitive distortions     Start:  03/09/24         Encourage Ashley Brandt to identify 2 trauma related cognitive distortions     Start:  03/09/24          ProgressTowards Goals: Progressing  Interventions: Supportive, Reframing, and Other: Inner child work, grief work  Summary: Ashley Brandt is a 53 y.o. female who presents with sxs to include but not limited to Change in energy/activity; Difficulty Concentrating; Fatigue; Hopelessness; Increase/decrease in appetite; Irritability; Tearfulness; Worthlessness; Restlessness; Sleep; Tension; Worrying; Irritability/anger; Guilt/shame. Pt was oriented times 5. Pt was cooperative and engaged. Pt denies SI/HI/AVH.   The patient is grappling with heavy feelings related to pain and treatment, feeling as though her condition has worsened despite attempts to manage it. As she learns more about her body and the changes brought on by menopause, she finds that her chronic pain intensifies.  Emotional responses to this struggle are heightened by her growing frustration with medical care providers who seem unable to address her needs effectively. 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  She is starting to identify and address her triggers, yet anger towards these healthcare professionals is beneath the surface, as a result of deeper feelings of fear, grief, and despair. These emotions often manifest as an anger iceberg, where the visible anger is only a small part of her larger emotional struggles, including a fear of abandonment and the weight of feeling she has disappointed herself and others.  Clinician worked with patient to  challenge this belief system with patient becoming tearful identifying a desire to parent differently than she experienced from her mother growing up.  As part of her therapeutic homework, the patient was encouraged to write a no send letter to her mother.  Suicidal/Homicidal: Nowithout intent/plan  Therapist Response: Clinician utilized active and supportive reflection to create a safe space for patient to process recent life events. Clinician assessed for current stressors, symptoms, safety since last session.  Clinician worked with patient to reframe negative belief systems about comparisons between her role as a mother versus her own experience with her mother.  Addressed unresolved feelings of grief and ways in which patient can navigate coping with present  triggers for her grief through inner child work.  Plan: Return again in 2 weeks.  Diagnosis: Adjustment disorder with anxious mood  Insomnia, unspecified type   Collaboration of Care: [facility] psychiatrist can access notes and cln. Will review psychiatrists' notes. Check in with the patient and will see LCSW per availability. Patient agreed with treatment recommendations.   Patient/Guardian was advised Release of Information must be obtained prior to any record release in order to collaborate their care with an outside provider. Patient/Guardian was advised if they have not already done so to contact the registration department to sign all necessary forms in order for us  to release information regarding their care.   Consent: Patient/Guardian gives verbal consent for treatment and assignment of benefits for services provided during this visit. Patient/Guardian expressed understanding and agreed to proceed.   [name], LCSW 11/22/2024

## 2024-11-24 ENCOUNTER — Encounter: Attending: Physical Medicine and Rehabilitation | Admitting: Physical Medicine and Rehabilitation

## 2024-11-24 DIAGNOSIS — G5771 Causalgia of right lower limb: Secondary | ICD-10-CM | POA: Insufficient documentation

## 2024-11-24 DIAGNOSIS — N951 Menopausal and female climacteric states: Secondary | ICD-10-CM | POA: Diagnosis not present

## 2024-11-24 NOTE — Progress Notes (Signed)
 Subjective:    Patient ID: Ashley Brandt, female    DOB: 05-27-1971, 53 y.o.   MRN: [id number]  HPI An audio/video tele-health visit is felt to be the most appropriate encounter for this patient at this time. This is a follow up tele-visit via phone. The patient is at home. MD is at office. Prior to scheduling this appointment, our staff discussed the limitations of evaluation and management by telemedicine and the availability of in-person appointments. The patient expressed understanding and agreed to proceed.   CC: RIght foot /toe pain  71) CRPS 53 year old female referred by her primary care physician for the evaluation of right foot and toe pain.  The patient states that she has 2 types of pain 1 that is on the bottom of the foot right around the area of the surgery the other area is more in the middle of the foot.  She really has few symptoms at the ankle or above.  She notes mild swelling in the right foot sometimes discoloration as well.  It is very hypersensitive to touch particularly on the plantar surface. 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  She was started on Lyrica  and had some side effects and did not wish to continue taking it.  The patient also had a behavioral health evaluation 04/02/2021 diagnosed with insomnia and adjustment disorder with mixed anxiety depressed mood.  She tried Ambien  and melatonin.  She has been on Cymbalta  30 mg up to 60 mg a day.  She is now on gabapentin  600 mg 3 times per day.  The patient has had a second opinion with orthopedic surgeon.  The patient has had an arterial Doppler on 04/23/2021 which was normal.  She is scheduling surgery at [facility] with a peripheral neurosurgeon with plans for resection of neuroma as well as wrapping the end of the neuroma with abdominal epidermis to prevent reforming  -she asks whether Butrans  patch is enough to replace Nucynta  since MME of latter was higher  -using the torsion foot in PT has been   -she asks about Butrans  patch, Lyrica   -pain has been severe recently  -she does not like the ketamine but she does feel that it helped her to walk, she feels dissociated for 1 week after- she felt disconnected from herself and her family  -she started walking and has found the torsion foot very helpful  -scrambler was ineffective  -her leg was really burning this morning  -she is taking Nucynta 50mg  TID, it is being prescribed by [facility]   -she has always been active and started to run a lot more during the pandemic. 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 She has had more pain and less function.   -she wanted a neurosurgeon to place her spinal cord stimulator  -she asks about compounding medication  CRPS has gotten considerably worse, she is currently 3 weeks post-op spinal cord stimulator placement, she does find it helps, but knows it will take time to get used it   -she currently feels a burning pain in her residual limb  -she had muscle reintigration  -has tried ketamine infusions before, she did not love doing them but she did feel that they helped, she is not a fan of tripping and she did feel that when she was coming out of  the infusion  -sometimes she feels a burning pain in her foot as well  Pain interferes with sleep at night  -prior to amputation she has tried bisphosphonate infusions, lumbar sympathetic nerve block, gabapentin , nortriptyline   -she underwent IO surgery and pain worsened after nerve block wore off -she is to start scrambler therapy next week -she will be in [place] for 2 weeks for scrambler therapy -she wants to wean off the gabapentin  but not at this time given her current pain levels -she has been doing journaling and self compassion medication  She is scheduled to have a sympathetic nerve block  1 week from thursday  CLINICAL DATA:  Severe plantar foot pain since foot surgery 3-4 weeks ago for removal of Morton's neuroma (on 01/24/2021).   EXAM: MRI OF THE RIGHT FOREFOOT WITHOUT CONTRAST   TECHNIQUE: Multiplanar, multisequence MR imaging of the right forefoot was performed. No intravenous contrast was administered.   COMPARISON:  Office foot radiographs 11/12/2020. No previous MRI available.   FINDINGS: Bones/Joint/Cartilage   Mild degenerative changes of the 1st metatarsophalangeal joint with a small subchondral cyst in the 1st metatarsal head and a small joint effusion. No other significant arthropathic changes. There is no evidence of acute fracture, dislocation or avascular necrosis.   Ligaments   The Lisfranc ligament is intact. The collateral ligaments of the metatarsophalangeal joints appear intact.   Muscles and Tendons   There is focal susceptibility artifact within the plantar soft tissues at the level of the 2nd and 3rd metatarsal necks, presumably postsurgical. There is mild surrounding soft tissue edema as well as trace fluid within the flexor tendon sheaths of the 2nd and 3rd digits. The interosseous muscles are mildly edematous. There is additional ill-defined low signal between the 2nd and 3rd metatarsal necks, also likely postsurgical. No drainable fluid collection.   Soft tissues   The plantar soft tissues are deformed by a capsule which was placed along the plantar aspect of the 3rd metatarsal head. As above, there is underlying soft tissue edema of and presumed postsurgical susceptibility artifact. No drainable fluid collection or soft tissue mass identified.   IMPRESSION: 1. Nonspecific soft tissue edema and susceptibility artifact in the plantar forefoot soft tissues, attributed to recent surgery. Possible mild flexor tenosynovitis of the 2nd and 3rd rays. 2. No drainable fluid collection. 3. First MTP joint degenerative changes and  small effusion. No acute osseous findings.     Electronically Signed   By: Elsie Perone M.D.   On: 04/08/2021 09:41 Pain Inventory Average Pain 7 Pain Right Now 5-6 My pain is intermittent and constant sharp, burning, and stabbing, aching, tingling  In the last 24 hours, has pain interfered with the following? General activity 10 Relation with others 10 Enjoyment of life 10 What TIME of day is your pain at its worst? evening and night Sleep (in general) poor to Fair  Pain is worse with: walking, sitting, and standing Pain improves with: rest, pacing activities, and medication Relief from Meds: 4  walk without assistance how many minutes can you walk? 5-10 ability to climb steps?  yes do you drive?  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/21 I need assistance with the following:  meal prep, household duties, and shopping  trouble walking dizziness anxiety  2) Hot flashes: -her last period was in June 2024 She has started having severe hot flashes -she had her estrogen levels checked and they were low -she has follow-up scheduled for potential  hormone replacement therapy -she read that gabapentin  and nucynta can decreased estrogen -she had an appointment with a telehealth physician today to start hormone replacement therapy and she is very excited about this -she had low levels of several hormones -she has been sleeping very poorly at night  Family History  Problem Relation Age of Onset   Depression Mother    Diabetes Mellitus II Mother    Heart Problems Father    Social History   Socioeconomic History   Marital status: Married    Spouse name: Not on file   Number of children: Not on file   Years of education: Not on file   Highest education level: Not on file  Occupational History   Not on file  Tobacco Use   Smoking status: Never   Smokeless tobacco: Never  Vaping Use   Vaping status: Never Used  Substance and Sexual  Activity   Alcohol  use: Yes    Comment: social    Drug use: No   Sexual activity: Yes    Birth control/protection: Abstinence, None  Other Topics Concern   Not on file  Social History Narrative   Not on file   Social Drivers of Health   Financial Resource Strain: Low Risk  (11/02/2023)   Received from [facility]   Overall Financial Resource Strain (CARDIA)    Difficulty of Paying Living Expenses: Not hard at all  Food Insecurity: No Food Insecurity (06/20/2024)   Received from [facility]   Food Insecurity    Within the past 12 months, the food you bought just didn't last and you didn't have money to get more.: Never true  Transportation Needs: No Transportation Needs (06/20/2024)   Received from [facility]   Transportation    In the past 12 months, has the lack of transportation kept you from medical appointments, meetings, work, or from getting things needed for daily living?: No  Physical Activity: Inactive (09/02/2021)   Received from [facility]   Exercise Vital Sign    On average, how many days per week do you engage in moderate to strenuous exercise (like a brisk walk)?: 0 days    On average, how many minutes do you engage in exercise at this level?: 0 min  Stress: Stress Concern Present (09/02/2021)   Received from [facility] of Occupational Health - Occupational Stress Questionnaire    Feeling of Stress : Very much  Social Connections: Moderately Isolated (09/02/2021)   Received from [facility]   Social Connection and Isolation Panel    In a typical week, how many times do you talk on the phone with family, friends, or neighbors?: Three times a week    How often do you get together with friends or relatives?: Twice a week    How often do you attend church or religious services?: Never    Do you belong to any clubs or organizations such as church groups, unions, fraternal or athletic groups,  or school groups?: No    How often do you attend meetings of the clubs or organizations you belong to?: Never    Are you married, widowed, divorced, separated, never married, or living with a partner?: Married   Past Surgical History:  Procedure Laterality Date   BREAST REDUCTION SURGERY  2006   CESAREAN SECTION     CESAREAN SECTION  2000   Past Medical History:  Diagnosis Date   Allergy  There were no vitals taken for this visit.  Opioid Risk Score:   Fall Risk Score:  `1  Depression screen [facility] 2/9     09/26/2024   11:01 AM 09/19/2024    1:29 PM 03/09/2024    1:11 PM 08/28/2023    9:56 AM 06/11/2021    2:53 PM 01/20/2016   11:27 AM  Depression screen PHQ 2/9  Decreased Interest 0   1 1 0  Down, Depressed, Hopeless 0   1 1 0  PHQ - 2 Score 0   2 2 0  Altered sleeping     1   Tired, decreased energy     1   Change in appetite     0   Feeling bad or failure about yourself      1   Trouble concentrating     1   Moving slowly or fidgety/restless     1   Suicidal thoughts     0   PHQ-9 Score     7    Difficult doing work/chores     Very difficult      Information is confidential and restricted. Go to Review Flowsheets to unlock data.   Data saved with a previous flowsheet row definition     Review of Systems  Musculoskeletal:  Positive for gait problem.       Foot pain  Neurological:  Positive for dizziness.  All other systems reviewed and are negative.      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 Toribio Amen's new book  on chronic pain -d/c cymbalta  -discussed that she can try low dose of Butrans  patch and apply two patches after 1 week if this is not effective enough  -d/c nucynta, discussed that she is on low dose, takes twice per day, does not need to wean off -prescribed lyrica  25mg  BID -discussed her desire to wean off gabapentin , discussed that she can try skipping morning 800mg  dose or I can send her 600mg  to take in the morning instead -prescribed Butrans  patch -discussed that she has read that gabapentin  and Nucynta can decrease estrogen levels  -discussed her pain, that she is on Nucynta, discussed that she has not tried long term opioid therapy or a pain pump, discussed that she has tried a vagal nerve stimulator  -discussed her pain, that she needs a new script for he compounded cream as hers has crystallized, discussed that she has weaned off the lorazepam , discussed that she has had benefit from dialudid in the past, discussed that she does use CBD products, discussed that she is interested in following with [facility] and [facility] in case she needs ketamine infusions in the past  -discussed that Butrans  can help, discussed belbuca  as well   -continue gabapentin , refilled  -discussed that she had spinal cord stimulator placed by NSGY 3 weeks ago, discussed risks of tolerance over time  -discussed Scrambler therapy, recommended that she discuss this with her neurosurgeon as well, discussed cost of the therapy  -discussed that she is pursuing osseointegration in [place]  -  discussed risks and benefits of ketamine troches and infusions -discussed that she has a spinal cord stimulator that was just reprogramed   -discussed that she had a flare this weekend  -discussed of her her CRPS, her flare this weekend, ketamine troches versus infusions, discussed that she does not want to go to [facility], discussed Qutenza, discussed it as a back-up option if surgery is ineffective  -discussed that  she does not do well with steroids  -discussed that she has gastritis with NSAIDs  -discussed that Journavx  did not help  -discussed that Nucynta did not help, discussed that she hated Cymbalta  in the past  -discussed that she weaned down to 10mg  of the Pamelor   Prescribed Ketamine 20%, Baclofen  2%, Cyclobenzaprine 2%, Ketoprofen 10%, Gabapentin  20%, Bupivacaine  1%, Amitryptiline 5%, clonidine 0.2% compounded cream  Dispense 240 g  Apply 1-2 g to affected area 3-4 times per day, discussed that she used it for the first time last night  -discussed that the ketamine caused her to dissociate from her pain and family -discussed that she had 3 daily sessions of ketamine, discussed that she went to [place] because she could not find anyone in in this area.    -discussed mechanism of action of low dose naltrexone as an opioid receptor antagonist which stimulates your body's production of its own natural endogenous opioids, helping to decrease pain. Discussed that it can also decrease T cell response and thus be helpful in decreasing inflammation, and symptoms of brain fog, fatigue, anxiety, depression, and allergies. Discussed that this medication needs to be compounded at a compounding pharmacy and can more expensive. Discussed that I usually start at 1mg  and if this is not providing enough relief then I titrate upward on a monthly basis. 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 She recently underwent transtibial osseointegration surgery on 06/17/2024. Ashley Brandt was previously ambulatory with a traditional transtibial prosthesis, which is now 79 months old.  Her current prosthetic foot and alignable components no longer meet her functional needs following osseointegration. Unlike traditional socket suspension, osseointegration transfers load directly through the  skeletal system, making the implant highly sensitive to repetitive vertical impact and rotational shear forces. Ashley Brandt's existing foot cannot provide adequate vertical shock absorption or torsional rotation, resulting in increased discomfort, gait hesitancy, slower walking speed on uneven terrain, and reduced overall satisfaction and quality of life.  A vertical shock and torsion foot is medically necessary to protect the osseointegration implant by minimizing repetitive impact and rotational shear forces. These features will improve comfort, confidence, and mobility on uneven surfaces, directly supporting safe community ambulation. Updated alignable components are also required to fine-tune sagittal, coronal, and transverse alignment with the new foot, ensuring proper load distribution and adaptability during rehabilitation. Together, these additions will restore functional independence, reduce implant stress, and enhance overall quality of life.  Ashley Brandt demonstrates the ability and potential for community ambulation with variable cadence, consistent with a K3 functional level. She lives independently in a multi-level home with stairs and ramps, performs household tasks, and intends to return to vocational and recreational activities requiring walking on uneven terrain. She has the cognitive ability, limb strength, range of motion, and cardiovascular health to safely use a transtibial prosthesis. A replacement foot with vertical shock and torsion features, along with updated alignable components, is medically necessary to support her rehabilitation, reduce pain and skin breakdown,  and promote safe, independent ambulation and participation in daily and community activities.  -discussed that she was wondering about [facility]  -Discussed Qutenza as an option for neuropathic pain control. Discussed that this is a capsaicin patch, stronger than capsaicin cream. Discussed that it is currently  approved for diabetic peripheral neuropathy and post-herpetic neuralgia, but that it has also shown benefit in treating other forms of neuropathy. Provided patient with link to site to learn more about the patch: https://www.clark.biz/. Discussed that the patch would be placed in office and benefits usually last 3 months. Discussed that unintended exposure to capsaicin can cause severe irritation of eyes, mucous membranes, respiratory tract, and skin, but that Qutenza is a local treatment and does not have the systemic side effects of other nerve medications. Discussed that there may be pain, itching, erythema, and decreased sensory function associated with the application of Qutenza. Side effects usually subside within 1 week. A cold pack of analgesic medications can help with these side effects. Blood pressure can also be increased due to pain associated with administration of the patch.   -Provided with a pain relief journal and discussed that it contains foods and lifestyle tips to naturally help to improve pain. Discussed that these lifestyle strategies are also very good for health unlike some medications which can have negative side effects. Discussed that the act of keeping a journal can be therapeutic and helpful to realize patterns what helps to trigger and alleviate pain.     2) Anxiety: -referred to psychiatry -prescribed buspar  -discussed that she has weaned off the lorazepam   3) Insomnia: -continue CBD gummies -continue meditation -Try to go outside near sunrise -Get exercise during the day.  -Turn off all devices an hour before bedtime.  -Teas that can benefit: chamomile, valerian root, Brahmi (Bacopa) -Can consider over the counter melatonin, magnesium, and/or L-theanine. Melatonin is an anti-oxidant with multiple health benefits. Magnesium is involved in greater than 300 enzymatic reactions in the body and most of us  are deficient as our soil is often depleted. There are 7  different types of magnesium- Bioptemizer's is a supplement with all 7 types, and each has unique benefits. Magnesium can also help with constipation and anxiety.  -Pistachios naturally increase the production of melatonin -[employer]zy [employer] bamboo bed sheets are free from toxic chemicals.  -Tart cherry juice or a tart cherry supplement can improve sleep and soreness post-workout  4) PTSD: -discussed benefits of EMDR therapy with Tawni Husband   5) Hot flashes: -discussed her appointment for hormonal replacement therapy, that she was very pleased with the physician she worked with, that she is very hopeful that HRT not only improves her hot flashes but also her pain and sleep, discussed that I have heard this to be the case for other patients on HRT -discussed that black cohosh, Effexor can be beneficial -discussed that her estrogen levels are low and she has f/u scheduled for potential hormone replacement therapy -discussed that she has read gabapentin  and nucynta can lower estroge -advised d/c of Nucynta since she does not find this helpful -advised possible wean of gabapentin  to 800mg  BID or 600/800/800mg 

## 2024-11-27 NOTE — Progress Notes (Unsigned)
 Virtual Visit via Video Note  I connected with Ashley Brandt on 12/02/2024 at 11:00 AM EST by a video enabled telemedicine application and verified that I am speaking with the correct person using two identifiers.  Location: Patient: home Provider: home office Persons participated in the visit- patient, provider    I discussed the limitations of evaluation and management by telemedicine and the availability of in person appointments. The patient expressed understanding and agreed to proceed.    I discussed the assessment and treatment plan with the patient. The patient was provided an opportunity to ask questions and all were answered. The patient agreed with the plan and demonstrated an understanding of the instructions.   The patient was advised to call back or seek an in-person evaluation if the symptoms worsen or if the condition fails to improve as anticipated.   Katheren Sleet, MD    [facility] MD/PA/NP OP Progress Note  12/02/2024 12:29 PM Ashley Brandt  MRN:  [id number]  Chief Complaint:  Chief Complaint  Patient presents with   Follow-up   HPI:  This is a follow-up appointment for adjustment disorder and insomnia.  She states that she is not taking lorazepam  anymore.  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  She states that it is so frustrating that no doctor recommended to check hormone before.  She has not been able to get estrogen due to lack of supply at the pharmacy.  She states that her pain got worse again, and medication stopped working around the time she had hot flashes.  She states that her mood is better.  She is busy with work.  She sees development worker, international aid.  Although ketamine treatment helped, it did not last.  She learned how estrogen can affect the pain from pharmacologist.  Although she reports occasional mood symptoms, which is correlated with her pain.  She has been working through therapy, including addressing anger towards her mother.  She states that  her mother is a source of disappointment in her entire life.  Her mother is extremely religious.  She is recommending her to be seen by a therapist.  She has been sleeping better to some extent, and she is hoping that she will sleep better after getting estrogen.  She denies SI.  She agrees with the plans as outlined below.   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 She considers her mother to be the best example of a Christian. She describes her childhood as generally positive, though there were challenges related to religion. Growing up, her family attended a fundamentalist church, and she recalls feeling conflicted during that time. She moved to [place] for her husband work,    Visit Diagnosis:    ICD-10-CM   1. Adjustment disorder with anxious mood  F43.22     2. Insomnia, unspecified type  G47.00       Past Psychiatric History: Please see initial evaluation for  full details. I have reviewed the history. No updates at this time.     Past Medical History:  Past Medical History:  Diagnosis Date   Allergy     Past Surgical History:  Procedure Laterality Date   BREAST REDUCTION SURGERY  2006   CESAREAN SECTION     CESAREAN SECTION  2000    Family Psychiatric History: Please see initial evaluation for full details. I have reviewed the history. No updates at this time.     Family History:  Family History  Problem Relation Age of Onset   Depression Mother    Diabetes Mellitus II Mother    Heart  Problems Father     Social History:  Social History   Socioeconomic History   Marital status: Married    Spouse name: Not on file   Number of children: Not on file   Years of education: Not on file   Highest education level: Not on file  Occupational History   Not on file  Tobacco Use   Smoking status: Never   Smokeless tobacco: Never  Vaping Use   Vaping status: Never Used  Substance and Sexual Activity   Alcohol  use: Yes    Comment: social    Drug use: No   Sexual activity: Yes    Birth control/protection: Abstinence, None  Other Topics Concern   Not on file  Social History Narrative   Not on file   Social Drivers of Health   Tobacco Use: Low Risk (12/02/2024)   Patient History    Smoking Tobacco Use: Never    Smokeless Tobacco Use: Never    Passive Exposure: Not on file  Financial Resource Strain: Low Risk  (11/02/2023)   Received from [facility]   Overall Financial Resource Strain (CARDIA)    Difficulty of Paying Living Expenses: Not hard at all  Food Insecurity: No Food Insecurity (06/20/2024)   Received from [facility]   Food Insecurity    Within the past 12 months, the food you bought just didn't last and you didn't have money to get more.: Never true  Transportation Needs: No Transportation Needs (06/20/2024)   Received from [facility]   Transportation    In the past 12 months, has the lack of transportation kept you from medical appointments, meetings, work, or from getting things needed for daily living?: No  Physical Activity: Not on file  Stress: Not on file  Social Connections: Not on file  Depression (PHQ2-9): Low Risk (09/26/2024)   Depression (PHQ2-9)    PHQ-2 Score: 0  Recent Concern: Depression (PHQ2-9) - Medium Risk (09/19/2024)   Depression (PHQ2-9)    PHQ-2 Score: 7  Alcohol  Screen: Not on file  Housing: Low Risk  (11/02/2023)   [facility]   Epic    In  the last 12 months, was there a time when you were not able to pay the mortgage or rent on time?: No    In the past 12 months, how many times have you moved where you were living?: 0    At any time in the past 12 months, were you homeless or living in a shelter (including now)?: No  Utilities: No (05/31/2024)   Received from [facility]   Utilities    In the last 12 months, have you had trouble paying your electricity, gas, heating/cooling, or water bill?: No  Health Literacy: Low Risk (06/20/2024)   Received from  [facility]   Health Literacy    How comfortable are you filling out medical forms by yourself?: Extremely    How often do you need to have someone help you when you read instructions, pamphlets, or other written material from your doctor or pharmacy?: Never    Allergies:  Allergies  Allergen Reactions   Dust Mite Mixed Allergen Ext [Mite (D. Farinae)]     Metabolic Disorder Labs: No results found for: HGBA1C, MPG No results found for: PROLACTIN No results found for: CHOL, TRIG, HDL, CHOLHDL, VLDL, LDLCALC No results found for: TSH  Therapeutic Level Labs: No results found for: LITHIUM No results found for: VALPROATE No results found for: CBMZ  Current Medications: Current Outpatient Medications  Medication Sig Dispense Refill   Ascorbic Acid (VITAMIN C) 1000 MG tablet Take 1,000 mg by mouth daily.     baclofen  (LIORESAL ) 10 MG tablet Take 1 tablet (10 mg total) by mouth 2 (two) times daily as needed for muscle spasms. 180 each 3   buprenorphine  (BUTRANS ) 10 MCG/HR PTWK Place 1 patch onto the skin once a week. 4 patch 0   busPIRone  (BUSPAR ) 5 MG tablet Take 1 tablet (5 mg total) by mouth 3 (three) times daily as needed. 90 tablet 3   cholecalciferol (VITAMIN D3) 25 MCG (1000 UNIT) tablet Take 5,000 Units by mouth daily.     gabapentin  (NEURONTIN ) 100 MG capsule Take 1 capsule (100 mg total) by mouth 3 (three)  times daily. 90 capsule 2   gabapentin  (NEURONTIN ) 800 MG tablet Take 1 tablet (800 mg total) by mouth 3 (three) times daily. 270 tablet 3   magnesium 30 MG tablet Take 30 mg by mouth 2 (two) times daily. 120mg      Multiple Vitamin (MULTIVITAMIN) tablet Take 1 tablet by mouth daily.     nortriptyline  (PAMELOR ) 10 MG capsule Take 2 capsules (20 mg total) by mouth at bedtime. 180 capsule 2   Omega-3 Fatty Acids (FISH OIL) 1000 MG CAPS Take 3,600 mg by mouth daily.     pregabalin  (LYRICA ) 25 MG capsule Take 1 capsule (25 mg total) by mouth 2 (two) times daily. 180 capsule 3   Suzetrigine  (JOURNAVX ) 50 MG TABS Take 1 tablet by mouth 2 (two) times daily as needed. 60 tablet 0   vitamin B-12 (CYANOCOBALAMIN) 1000 MCG tablet Take 1,000 mcg by mouth daily. 500 mcg     No current facility-administered medications for this visit.     Musculoskeletal: Strength & Muscle Tone: N/A Gait & Station: N/A Patient leans: N/A  Psychiatric Specialty Exam: Review of Systems  There were no vitals taken for this visit.There is no height or weight on file to calculate BMI.  General Appearance: Well Groomed  Eye Contact:  Good  Speech:  Clear and Coherent  Volume:  Normal  Mood:  frustrated  Affect:  Appropriate, Congruent, and calm  Thought Process:  Coherent  Orientation:  Full (Time, Place, and Person)  Thought Content: Logical   Suicidal Thoughts:  No  Homicidal Thoughts:  No  Memory:  Immediate;   Good  Judgement:  Good  Insight:  Good  Psychomotor Activity:  Normal  Concentration:  Concentration: Good and Attention Span: Good  Recall:  Good  Fund of Knowledge: Good  Language: Good  Akathisia:  No  Handed:  Right  AIMS (if indicated): not done  Assets:  Communication Skills Desire for Improvement  ADL's:  Intact  Cognition: WNL  Sleep:  Fair  Screenings: GAD-7    Advertising Copywriter from 09/19/2024 in [facility] Counselor from 03/09/2024 in  [facility]  Total GAD-7 Score 5 11   PHQ2-9    Flowsheet Row Office Visit from 09/26/2024 in [facility] Counselor from 09/19/2024 in [facility] Counselor from 03/09/2024 in [facility] Office Visit from 08/28/2023 in [facility] Office Visit from 06/11/2021 in [facility]  PHQ-2 Total Score 0 2 6 2 2   PHQ-9 Total Score -- 7 9 -- 7   Flowsheet Row Counselor from 09/19/2024 in [facility] Counselor from 03/09/2024 in [facility] ED from 03/22/2023 in [facility] Department at [facility]  C-SSRS RISK CATEGORY No Risk No Risk No Risk     Assessment and Plan:  Ashley Brandt is a 25. 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   1. Adjustment disorder with anxious mood She reports health anxiety, influenced by her father's death in his 12s and her mother's history of diabetes. Psychologically, she describes experiencing internal conflict related to her upbringing in a fundamentalist church.  History: no prior mental health history prior to BKA.  She denies significant mood symptoms, aside from frustration related to her recently identified low estrogen level.  She was able to taper off lorazepam  without significant change in her mood.  She has been engaged in therapy.  Will not proceed with pharmacological treatment at this time.   2. Insomnia, unspecified type - UDS positive for cannabinoid, 11/2023. On Sound Asleep, which contains L-Theanine 100mg , Full Spectrum Hemp Extract (from hemp extract aerial parts) 58mg , Cannabidiol (CBD) (hemp extract  aerial parts) 50mg , THC (from hemp extract aerial parts) 5mg , Additional Minor Cannabinoids(from hemp extract aerial parts) 3mg , Melatonin 3mg      She was able to taper off lorazepam .  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 She takes Medtera, which contains CBD, meltatonin 3 mg at night with some good effect, while she is willing to taper this off.  Discussed psychoeducation about melatonin; limited data about long-term use.  It was previously discussed regarding the concern of CBD/THC use on mental health.   Plan Hold lorazepam  (was taking 0.5-1 mg as needed for insomnia, anxiety) Next appointment- as needed - on gabapentin  800 mg TID, nortriptyline  10 mg daily - on Medtera for sleep   Past trials of medication: duloxetine  (did not feel like herself), Mirtazapine (increase in appetite), trazodone (visual changes).  Lyrica , amitriptyline   The patient demonstrates the following risk factors for suicide: Chronic risk factors for suicide include: chronic pain. Acute risk factors for suicide include: loss (financial, interpersonal, professional). Protective factors for this patient include: positive social support, responsibility to others (children, family), coping skills, and hope for the future. Considering these factors, the overall suicide risk at this point appears to be low. Patient is appropriate for outpatient follow up.   A total of 24 minutes was spent on the following activities during the encounter date, which includes but is not limited to: preparing to see the patient (e.g., reviewing tests and records), obtaining and/or reviewing separately  obtained history, performing a medically necessary examination or evaluation, counseling and educating the patient, family, or caregiver, ordering medications, tests, or procedures, referring and communicating with other healthcare professionals (when not reported  separately), documenting clinical information in the electronic or paper health record, independently interpreting test or lab results and communicating these results to the family or caregiver, and coordinating care (when not reported separately).   Collaboration of Care: Collaboration of Care: Other reviewed notes in Epic  Patient/Guardian was advised Release of Information must be obtained prior to any record release in order to collaborate their care with an outside provider. Patient/Guardian was advised if they have not already done so to contact the registration department to sign all necessary forms in order for us  to release information regarding their care.   Consent: Patient/Guardian gives verbal consent for treatment and assignment of benefits for services provided during this visit. Patient/Guardian expressed understanding and agreed to proceed.    Katheren Sleet, MD 12/02/2024, 12:29 PM

## 2024-11-29 ENCOUNTER — Other Ambulatory Visit: Payer: Self-pay | Admitting: Physical Medicine and Rehabilitation

## 2024-11-29 MED ORDER — BUPRENORPHINE 10 MCG/HR TD PTWK: 1.0000 | MEDICATED_PATCH | TRANSDERMAL | 0 refills | Status: DC

## 2024-12-02 ENCOUNTER — Encounter: Payer: Self-pay | Admitting: Psychiatry

## 2024-12-02 ENCOUNTER — Telehealth: Admitting: Psychiatry

## 2024-12-02 DIAGNOSIS — G4700 Insomnia, unspecified: Secondary | ICD-10-CM

## 2024-12-02 DIAGNOSIS — F4322 Adjustment disorder with anxiety: Secondary | ICD-10-CM

## 2024-12-02 NOTE — Patient Instructions (Signed)
 Hold lorazepam  Next appointment- as needed

## 2024-12-06 ENCOUNTER — Encounter: Admitting: Physical Medicine and Rehabilitation

## 2024-12-06 DIAGNOSIS — G5771 Causalgia of right lower limb: Secondary | ICD-10-CM | POA: Diagnosis not present

## 2024-12-06 NOTE — Progress Notes (Signed)
 Subjective:    Patient ID: Ashley Brandt, female    DOB: 15-Nov-1971, 53 y.o.   MRN: [id number]  HPI An audio/video tele-health visit is felt to be the most appropriate encounter for this patient at this time. This is a follow up tele-visit via phone. The patient is at home. MD is at office. Prior to scheduling this appointment, our staff discussed the limitations of evaluation and management by telemedicine and the availability of in-person appointments. The patient expressed understanding and agreed to proceed.   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  The patient states that she has 2 types of pain 1 that is on the bottom of the foot right around the area of the surgery the other area is more in the middle of the foot.  She really has few symptoms at the ankle or above.  She notes mild swelling in the right foot sometimes discoloration as well.  It is very hypersensitive to touch particularly on the plantar surface. 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  She was started on Lyrica  and had some side effects and did not wish to continue taking it.  The patient also had a behavioral health evaluation 04/02/2021 diagnosed with insomnia and adjustment disorder with mixed anxiety depressed mood.  She tried Ambien  and melatonin.  She has been on Cymbalta  30 mg up to 60 mg a day.  She is now on gabapentin  600 mg 3 times per day.  The patient has had a second opinion with orthopedic surgeon.  The patient  has had an arterial Doppler on 04/23/2021 which was normal.  She is scheduling surgery at [facility] with a peripheral neurosurgeon with plans for resection of neuroma as well as wrapping the end of the neuroma with abdominal epidermis to prevent reforming  -she asks whether Butrans  patch is enough to replace Nucynta since MME of latter was higher  -using the torsion foot in PT has been   -she asks about Butrans  patch, Lyrica   -pain has been severe recently  -she does not like the ketamine but she does feel that it helped her to walk, she feels dissociated for 1 week after- she felt disconnected from herself and her family  -she started walking and has found the torsion foot very helpful  -scrambler was ineffective  -her leg was really burning this morning  -she is taking Nucynta 50mg  TID, it is being prescribed by [facility]   -she has always been active and started to run a lot more during the pandemic. 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 She has had more pain and less function.   -she wanted a neurosurgeon to place her spinal cord stimulator  -she asks about compounding medication  CRPS has gotten considerably worse, she is currently 3 weeks post-op spinal cord stimulator placement, she does find it helps, but knows it will take  time to get used it   -she currently feels a burning pain in her residual limb  -she had muscle reintigration  -has tried ketamine infusions before, she did not love doing them but she did feel that they helped, she is not a fan of tripping and she did feel that when she was coming out of the infusion  -sometimes she feels a burning pain in her foot as well  Pain interferes with sleep at night  -prior to amputation she has tried bisphosphonate infusions, lumbar sympathetic nerve block, gabapentin , nortriptyline   -she underwent IO surgery and pain worsened after  nerve block wore off -she is to start scrambler therapy next week -she will be in [place] for 2 weeks for scrambler therapy -she wants to wean off the gabapentin  but not at this time given her current pain levels -she has been doing journaling and self compassion medication  She is scheduled to have a sympathetic nerve block 1 week from thursday  CLINICAL DATA:  Severe plantar foot pain since foot surgery 3-4 weeks ago for removal of Morton's neuroma (on 01/24/2021).   EXAM: MRI OF THE RIGHT FOREFOOT WITHOUT CONTRAST   TECHNIQUE: Multiplanar, multisequence MR imaging of the right forefoot was performed. No intravenous contrast was administered.   COMPARISON:  Office foot radiographs 11/12/2020. No previous MRI available.   FINDINGS: Bones/Joint/Cartilage   Mild degenerative changes of the 1st metatarsophalangeal joint with a small subchondral cyst in the 1st metatarsal head and a small joint effusion. No other significant arthropathic changes. There is no evidence of acute fracture, dislocation or avascular necrosis.   Ligaments   The Lisfranc ligament is intact. The collateral ligaments of the metatarsophalangeal joints appear intact.   Muscles and Tendons   There is focal susceptibility artifact within the plantar soft tissues at the level of the 2nd and 3rd metatarsal necks, presumably postsurgical. There is mild surrounding soft tissue edema as well as trace fluid within the flexor tendon sheaths of the 2nd and 3rd digits. The interosseous muscles are mildly edematous. There is additional ill-defined low signal between the 2nd and 3rd metatarsal necks, also likely postsurgical. No drainable fluid collection.   Soft tissues   The plantar soft tissues are deformed by a capsule which was placed along the plantar aspect of the 3rd metatarsal head. As above, there is underlying soft tissue edema of and presumed postsurgical susceptibility artifact. No drainable fluid  collection or soft tissue mass identified.   IMPRESSION: 1. Nonspecific soft tissue edema and susceptibility artifact in the plantar forefoot soft tissues, attributed to recent surgery. Possible mild flexor tenosynovitis of the 2nd and 3rd rays. 2. No drainable fluid collection. 3. First MTP joint degenerative changes and small effusion. No acute osseous findings.     Electronically Signed   By: Elsie Perone M.D.   On: 04/08/2021 09:41 Pain Inventory Average Pain 7 Pain Right Now 5-6 My pain is intermittent and constant sharp, burning, and stabbing, aching, tingling  In the last 24 hours, has pain interfered with the following? General activity 10 Relation with others 10 Enjoyment of life 10 What TIME of day is your pain at its worst? evening and night Sleep (in general) poor to Fair  Pain is worse with: walking, sitting, and standing Pain improves with: rest, pacing activities, and medication Relief from Meds: 4  walk without assistance how many minutes can you walk? 5-10 ability to climb steps?  yes do you drive?  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 I need assistance with the following:  meal prep, household duties, and shopping  trouble walking dizziness anxiety  2) Hot flashes: -her last period was in June 2024 She has started having severe hot flashes -she had her estrogen levels checked and they were low -she has follow-up scheduled for potential hormone replacement therapy -she read that gabapentin  and nucynta can decreased estrogen -she had an appointment with a telehealth physician today to start hormone replacement therapy and she is very excited about this -she had low levels of several hormones -she has been sleeping very poorly at night  Family History  Problem Relation Age of Onset   Depression Mother    Diabetes Mellitus II Mother    Heart Problems Father    Social History   Socioeconomic History    Marital status: Married    Spouse name: Not on file   Number of children: Not on file   Years of education: Not on file   Highest education level: Not on file  Occupational History   Not on file  Tobacco Use   Smoking status: Never   Smokeless tobacco: Never  Vaping Use   Vaping status: Never Used  Substance and Sexual Activity   Alcohol  use: Yes    Comment: social    Drug use: No   Sexual activity: Yes    Birth control/protection: Abstinence, None  Other Topics Concern   Not on file  Social History Narrative   Not on file   Social Drivers of Health   Tobacco Use: Low Risk (12/02/2024)   Patient History    Smoking Tobacco Use: Never    Smokeless Tobacco Use: Never    Passive Exposure: Not on file  Financial Resource Strain: Low Risk  (11/02/2023)   Received from [facility]   Overall Financial Resource Strain (CARDIA)    Difficulty of Paying Living Expenses: Not hard at all  Food Insecurity: No Food Insecurity (06/20/2024)   Received from [facility]   Food Insecurity    Within the past 12 months, the food you bought just didn't last and you didn't have money to get more.: Never true  Transportation Needs: No Transportation Needs (06/20/2024)   Received from [facility]   Transportation    In the past 12 months, has the lack of transportation kept you from medical appointments, meetings, work, or from getting things needed for daily living?: No  Physical Activity: Not on file  Stress: Not on file  Social Connections: Not on file  Depression (PHQ2-9): Low Risk (09/26/2024)   Depression (PHQ2-9)    PHQ-2 Score: 0  Recent Concern: Depression (PHQ2-9) - Medium Risk (09/19/2024)   Depression (PHQ2-9)    PHQ-2 Score: 7  Alcohol  Screen: Not on file  Housing: Low Risk  (11/02/2023)   Received from [facility]   Epic    In the last 12 months, was there a time when you were not able to pay the mortgage or  rent on time?: No    In the past 12 months, how many times have you moved where you were living?: 0    At any time in the past 12 months, were you homeless or living in a shelter (including now)?: No  Utilities: No (05/31/2024)   Received from [facility]   Utilities    In the last 12 months, have you had trouble paying your electricity, gas,  heating/cooling, or water bill?: No  Health Literacy: Low Risk (06/20/2024)   Received from [facility]   Health Literacy    How comfortable are you filling out medical forms by yourself?: Extremely    How often do you need to have someone help you when you read instructions, pamphlets, or other written material from your doctor or pharmacy?: Never   Past Surgical History:  Procedure Laterality Date   BREAST REDUCTION SURGERY  2006   CESAREAN SECTION     CESAREAN SECTION  2000   Past Medical History:  Diagnosis Date   Allergy    There were no vitals taken for this visit.  Opioid Risk Score:   Fall Risk Score:  `1  Depression screen [facility] 2/9     09/26/2024   11:01 AM 09/19/2024    1:29 PM 03/09/2024    1:11 PM 08/28/2023    9:56 AM 06/11/2021    2:53 PM 01/20/2016   11:27 AM  Depression screen PHQ 2/9  Decreased Interest 0   1 1 0  Down, Depressed, Hopeless 0   1 1 0  PHQ - 2 Score 0   2 2 0  Altered sleeping     1   Tired, decreased energy     1   Change in appetite     0   Feeling bad or failure about yourself      1   Trouble concentrating     1   Moving slowly or fidgety/restless     1   Suicidal thoughts     0   PHQ-9 Score     7    Difficult doing work/chores     Very difficult      Information is confidential and restricted. Go to Review Flowsheets to unlock data.   Data saved with a previous flowsheet row definition     Review of Systems  Musculoskeletal:  Positive for gait problem.       Foot pain  Neurological:  Positive for dizziness.  All other systems reviewed and are negative.       Objective:   Not performed       Assessment & Plan:   CRPS 2: -continue nortriptyline , change to night -continue baclofen  10mg  BID prn for muscle spasms -discussed savella and she would like to read about this option before trying it -discussed Norleen Ledger theory of repressed rage, that often chronic pain, depression, anxiety can occur more often in people who are very kind hearted, take on the concerns of others, and like to please others -recommended Dr. Toribio Amen's new book on chronic pain -d/c cymbalta   -d/c nucynta, discussed that she is on low dose, takes twice per day, does not need to wean off  -discussed her desire to wean off gabapentin , discussed that she can try skipping morning 800mg  dose or I can send her 600mg  to take in the morning instead -prescribed Butrans  patch -discussed that she has read that gabapentin  and Nucynta can decrease estrogen levels  -discussed her pain, that she is on Nucynta, discussed that she has not tried long term opioid therapy or a pain pump, discussed that she has tried a vagal nerve stimulator  -discussed her pain, that she needs a new script for he compounded cream as hers has crystallized, discussed that she has weaned off the lorazepam , discussed that she has had benefit from dialudid in the past, discussed that she does use CBD products, discussed that she  is interested in following with [facility] and [facility] in case she needs ketamine infusions in the past  -discussed that Butrans  patch has not been helpful, discussed increasing dose but she defers at this time, discussed that she is experiencing rash from the patch but is is mild   -continue gabapentin    -discussed that she has had hallucinations in response to Lyrica  in the past  -discussed that she had spinal cord stimulator placed by [facility] 3 weeks ago, discussed risks of tolerance over time  -discussed Scrambler therapy, recommended that she discuss this with her neurosurgeon as well,  discussed cost of the therapy  -discussed that she is pursuing osseointegration in [place]  -discussed risks and benefits of ketamine troches and infusions -discussed that she has a spinal cord stimulator that was just reprogramed   -discussed that she had a flare this weekend  -discussed of her her CRPS, her flare this weekend, ketamine troches versus infusions, discussed that she does not want to go to [facility] in [place], discussed Qutenza, discussed it as a back-up option if surgery is ineffective  -discussed that she does not do well with steroids  -discussed that she has gastritis with NSAIDs  -discussed that Journavx  did not help  -discussed that Nucynta did not help, discussed that she hated Cymbalta  in the past  -discussed that she weaned down to 10mg  of the Pamelor   Prescribed Ketamine 20%, Baclofen  2%, Cyclobenzaprine 2%, Ketoprofen 10%, Gabapentin  20%, Bupivacaine  1%, Amitryptiline 5%, clonidine 0.2% compounded cream  Dispense 240 g  Apply 1-2 g to affected area 3-4 times per day, discussed that she used it for the first time last night  -discussed that the ketamine caused her to dissociate from her pain and family -discussed that she had 3 daily sessions of ketamine, discussed that she went to [place] because she could not find anyone in in this area.    -discussed mechanism of action of low dose naltrexone as an opioid receptor antagonist which stimulates your body's production of its own natural endogenous opioids, helping to decrease pain. Discussed that it can also decrease T cell response and thus be helpful in decreasing inflammation, and symptoms of brain fog, fatigue, anxiety, depression, and allergies. Discussed that this medication needs to be compounded at a compounding pharmacy and can more expensive. Discussed that I usually start at 1mg  and if this is not providing enough relief then I titrate upward on a monthly basis. 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 She recently underwent transtibial osseointegration surgery on 06/17/2024. Ashley Brandt was previously ambulatory with a traditional transtibial prosthesis, which is now 24 months old.  Her current prosthetic foot and alignable components no longer meet her functional needs following osseointegration. Unlike traditional socket suspension, osseointegration transfers load directly through the skeletal system, making the implant highly sensitive to repetitive vertical impact and rotational shear forces. Ashley Brandt existing foot cannot provide adequate vertical shock absorption or torsional rotation, resulting in increased discomfort, gait hesitancy, slower walking speed on uneven terrain, and reduced overall satisfaction and quality of life.  A vertical shock and torsion foot is medically necessary to protect the osseointegration implant by minimizing repetitive impact and rotational shear forces. These features will improve comfort, confidence, and mobility on uneven surfaces, directly supporting safe community ambulation. Updated alignable components are also required to fine-tune sagittal, coronal, and transverse alignment with the new foot,  ensuring proper load distribution and adaptability during rehabilitation. Together, these additions will restore functional independence, reduce implant stress, and enhance overall quality of life.  Ashley Brandt demonstrates the ability and potential for community ambulation with variable cadence, consistent with a K3 functional level. She lives independently in a multi-level home with stairs and ramps, performs household tasks, and intends to return to vocational and recreational activities requiring walking on uneven terrain. She has the cognitive ability, limb strength, range of motion, and cardiovascular health to safely use a  transtibial prosthesis. A replacement foot with vertical shock and torsion features, along with updated alignable components, is medically necessary to support her rehabilitation, reduce pain and skin breakdown, and promote safe, independent ambulation and participation in daily and community activities.  -discussed that she was wondering about [facility]  -Discussed Qutenza as an option for neuropathic pain control. Discussed that this is a capsaicin patch, stronger than capsaicin cream. Discussed that it is currently approved for diabetic peripheral neuropathy and post-herpetic neuralgia, but that it has also shown benefit in treating other forms of neuropathy. Provided patient with link to site to learn more about the patch: https://www.clark.biz/. Discussed that the patch would be placed in office and benefits usually last 3 months. Discussed that unintended exposure to capsaicin can cause severe irritation of eyes, mucous membranes, respiratory tract, and skin, but that Qutenza is a local treatment and does not have the systemic side effects of other nerve medications. Discussed that there may be pain, itching, erythema, and decreased sensory function associated with the application of Qutenza. Side effects usually subside within 1 week. A cold pack of analgesic medications can help with these side effects. Blood pressure can also be increased due to pain associated with administration of the patch.   -Provided with a pain relief journal and discussed that it contains foods and lifestyle tips to naturally help to improve pain. Discussed that these lifestyle strategies are also very good for health unlike some medications which can have negative side effects. Discussed that the act of keeping a journal can be therapeutic and helpful to realize patterns what helps to trigger and alleviate pain.     2) Anxiety: -referred to psychiatry -prescribed buspar  -discussed that she has weaned off  the lorazepam   3) Insomnia: -continue CBD gummies -continue meditation -Try to go outside near sunrise -Get exercise during the day.  -Turn off all devices an hour before bedtime.  -Teas that can benefit: chamomile, valerian root, Brahmi (Bacopa) -Can consider over the counter melatonin, magnesium, and/or L-theanine. Melatonin is an anti-oxidant with multiple health benefits. Magnesium is involved in greater than 300 enzymatic reactions in the body and most of us  are deficient as our soil is often depleted. There are 7 different types of magnesium- Bioptemizer's is a supplement with all 7 types, and each has unique benefits. Magnesium can also help with constipation and anxiety.  -Pistachios naturally increase the production of melatonin -[employer] bamboo bed sheets are free from toxic chemicals.  -Tart cherry juice or a tart cherry supplement can improve sleep and soreness post-workout  4) PTSD: -discussed benefits of EMDR therapy with Tawni Husband   5) Hot flashes: -discussed her appointment for hormonal replacement therapy, that she was very pleased with the physician she worked with, that she is very hopeful that HRT not only improves her hot flashes but also her pain and sleep, discussed that I have heard this to be the case for other patients on HRT -discussed that black  cohosh, Effexor can be beneficial -discussed that her estrogen levels are low and she has f/u scheduled for potential hormone replacement therapy -discussed that she has read gabapentin  and nucynta can lower estroge -advised d/c of Nucynta since she does not find this helpful -advised possible wean of gabapentin  to 800mg  BID or 600/800/800mg 

## 2024-12-09 ENCOUNTER — Ambulatory Visit: Admitting: Licensed Clinical Social Worker

## 2024-12-09 DIAGNOSIS — F320 Major depressive disorder, single episode, mild: Secondary | ICD-10-CM | POA: Diagnosis not present

## 2024-12-09 DIAGNOSIS — F4322 Adjustment disorder with anxiety: Secondary | ICD-10-CM | POA: Diagnosis not present

## 2024-12-09 DIAGNOSIS — G4700 Insomnia, unspecified: Secondary | ICD-10-CM

## 2024-12-09 NOTE — Progress Notes (Signed)
 "  THERAPIST PROGRESS NOTE  Virtual Visit via Video Note  I connected with Ashley Brandt on 12/09/2024 at 11:00 AM EST by a video enabled telemedicine application and verified that I am speaking with the correct person using two identifiers.  Location: Patient: Address on file  Provider: Providers address   I discussed the limitations of evaluation and management by telemedicine and the availability of in person appointments. The patient expressed understanding and agreed to proceed.  I discussed the assessment and treatment plan with the patient. The patient was provided an opportunity to ask questions and all were answered. The patient agreed with the plan and demonstrated an understanding of the instructions.   The patient was advised to call back or seek an in-person evaluation if the symptoms worsen or if the condition fails to improve as anticipated.  I provided 62 minutes of non-face-to-face time during this encounter.   Ashley KATHEE Husband, LCSW   Session Time: 11:03-12:05pm  Participation Level: Active  Behavioral Response: CasualAlertNegative, Worthless, and Tearful  Type of Therapy: Individual Therapy  Treatment Goals addressed:  Active     Anxiety     LTG: Ashley Brandt will score less than 5 on the Generalized Anxiety Disorder 7 Scale (GAD-7)  (Progressing)     Start:  03/09/24    Expected End:  12/19/24       Goal Note     09/19/24: I feel like my anxiety has improved. Last week, my anxiety was noticeable. I could feel my breath being shallow and I was sensitive to unexpected noises.          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 Shares this has improved since 09/14/24.         Work  with patient individually to identify the major components of a recent episode of anxiety: physical symptoms, major thoughts and images, and major behaviors they experienced     Start:  03/09/24         Perform motivational interviewing regarding physical activity     Start:  03/09/24         Coping Skills      Start:  03/09/24       Will work with the pt using CBT/DBT techniques to help the pt verbalize an understanding of the cognitive, physiological, and behavioral components of anxiety and its treatment. This will be done by using worksheets, interactive activities, CBT/ABC thought logs, modeling, homework, role playing and journaling. Will work with pt to learn and implement coping skills that result in a reduction of anxiety and improve daily functioning per pt self-report 3 out of 5 documented sessions.         BH CCP Acute or Chronic Trauma Reaction     LTG: Elimination of maladaptive behaviors and thinking patterns which interfere with resolution of trauma as evidenced by self report (Progressing)     Start:  03/09/24    Expected End:  12/19/24         LTG: Recall traumatic events without becoming overwhelmed with negative emotions (Progressing)     Start:  03/09/24    Expected End:  12/19/24         STG:  Pt reports she would like to learn how to set boundaries with her mother around religious beliefs (Progressing)     Start:  03/09/24    Expected End:  12/19/24         LTG: Address childhood trauma centered around religion. 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       Provide Ashley Brandt with education on trauma-oriented therapy     Start:  03/09/24         Cooperate with trauma-focused psychotherapy techniques to reduce emotional reaction to the traumatic event      Start:  03/09/24          Teach Ashley Brandt coping strategies (e.g., writing down thoughts and feelings in a journal; taking deep, slow breaths; calling a support person to talk about memories) to deal with trauma memories and sudden emotional reactions without becoming emotionally n     Start:  03/09/24         Educate Ashley Brandt on trauma influenced cognitive distortions     Start:  03/09/24         Encourage Ashley Brandt to identify 2 trauma related cognitive distortions     Start:  03/09/24            ProgressTowards Goals: Progressing  Interventions: CBT, Supportive, and Reframing  Summary: Ashley Brandt is a 53 y.o. female who presents with sxs to include but not limited to Change in energy/activity; Difficulty Concentrating; Fatigue; Hopelessness; Increase/decrease in appetite; Irritability; Tearfulness; Worthlessness; Restlessness; Sleep; Tension; Worrying; Irritability/anger; Guilt/shame. Pt was oriented times 5. Pt was cooperative and engaged. Pt denies SI/HI/AVH.   She reflected on the letter she wrote but decided not to send to her mother, which helped her release some of her anger. She reported that she has not spoken to her mother as she is trying to figure out how to address her triggers. She feels guilty for not being there for her mother.  Identified she feels if she had not experienced religious trauma in childhood she would not have developed  a state of hypervigilance which she believes would have prevented her from engaging in a medical procedure that led to medical trauma.   Currently, she feels stuck in survival mode, which affects her motivation to interact with others. We discussed how to manage her expectations regarding her relationship with her mother. The patient has low self-worth, and her mother's acceptance feels increasingly important to her. This low self-esteem as a daughter contributes to her feelings of guilt.  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  Suicidal/Homicidal: Nowithout intent/plan  Therapist Response: Clinician utilized active and supportive reflection to create a safe space for patient to process recent life events. Clinician assessed for current stressors, symptoms, safety since last session. Clinician worked with patient to reframe negative belief systems about comparisons between her role as a mother versus her own experience with her mother. Worked through CBT to assist the patient on identifying positive consequences of shifting her focus to controllable factors and improvements to her mental health if she was not fearful of acting as other  parental figures from her childhood.   Plan: Return again in 2 weeks.  Diagnosis: (chronic) Adjustment disorder with anxious mood  Insomnia, unspecified type  Current mild episode of major depressive disorder, unspecified whether recurrent   Collaboration of Care: AEB psychiatrist can access notes and cln. Will review psychiatrists' notes. Check in with the patient and will see LCSW per availability. Patient agreed with treatment recommendations.   Patient/Guardian was advised Release of Information must be obtained prior to any record release in order to collaborate their care with an outside provider. Patient/Guardian was advised if they have not already done so to contact the registration department to sign all necessary forms in order for us  to release information regarding their care.   Consent: Patient/Guardian gives verbal consent for treatment and assignment of benefits for services provided during this visit. Patient/Guardian expressed understanding and agreed to proceed.   Ashley KATHEE Husband, LCSW 12/09/2024  "

## 2024-12-23 ENCOUNTER — Ambulatory Visit (INDEPENDENT_AMBULATORY_CARE_PROVIDER_SITE_OTHER): Admitting: Licensed Clinical Social Worker

## 2024-12-23 DIAGNOSIS — F4322 Adjustment disorder with anxiety: Secondary | ICD-10-CM | POA: Diagnosis not present

## 2024-12-23 DIAGNOSIS — G4700 Insomnia, unspecified: Secondary | ICD-10-CM | POA: Diagnosis not present

## 2024-12-23 DIAGNOSIS — F320 Major depressive disorder, single episode, mild: Secondary | ICD-10-CM | POA: Diagnosis not present

## 2024-12-23 NOTE — Progress Notes (Signed)
 "  THERAPIST PROGRESS NOTE  Virtual Visit via Video Note  I connected with Ashley Brandt on 12/23/2024 at  9:00 AM EST by a video enabled telemedicine application and verified that I am speaking with the correct person using two identifiers.  Location: Patient: Address on file  Provider: Providers Address   I discussed the limitations of evaluation and management by telemedicine and the availability of in person appointments. The patient expressed understanding and agreed to proceed.   I discussed the assessment and treatment plan with the patient. The patient was provided an opportunity to ask questions and all were answered. The patient agreed with the plan and demonstrated an understanding of the instructions.   The patient was advised to call back or seek an in-person evaluation if the symptoms worsen or if the condition fails to improve as anticipated.  I provided 53 minutes of non-face-to-face time during this encounter.   Ashley KATHEE Husband, LCSW   Session Time: 9-9:53am  Participation Level: Active  Behavioral Response: CasualAlertEuthymic and Positive, Hopeful   Type of Therapy: Individual Therapy  Treatment Goals addressed:  Active     Anxiety     LTG: Ashley Brandt will score less than 5 on the Generalized Anxiety Disorder 7 Scale (GAD-7)  (Progressing)     Start:  03/09/24    Expected End:  04/22/25       Goal Note     12/23/24: Patient reflected on various components of holistic and Western medical treatment options that have led to overall improved health and effectiveness of mental health medications.  Patient reports her overall mood has improved significantly in the past month.         STG: Ashley Brandt will reduce frequency of avoidant behaviors by 50% as evidenced by self-report in therapy sessions (Progressing)     Start:  03/09/24    Expected End:  04/22/25         Work with patient individually to identify the major components of a recent episode of  anxiety: physical symptoms, major thoughts and images, and major behaviors they experienced     Start:  03/09/24         Perform motivational interviewing regarding physical activity     Start:  03/09/24         Coping Skills      Start:  03/09/24       Will work with the pt using CBT/DBT techniques to help the pt verbalize an understanding of the cognitive, physiological, and behavioral components of anxiety and its treatment. This will be done by using worksheets, interactive activities, CBT/ABC thought logs, modeling, homework, role playing and journaling. Will work with pt to learn and implement coping skills that result in a reduction of anxiety and improve daily functioning per pt self-report 3 out of 5 documented sessions.         BH CCP Acute or Chronic Trauma Reaction     LTG: Elimination of maladaptive behaviors and thinking patterns which interfere with resolution of trauma as evidenced by self report (Progressing)     Start:  03/09/24    Expected End:  04/22/25         LTG: Recall traumatic events without becoming overwhelmed with negative emotions (Progressing)     Start:  03/09/24    Expected End:  04/22/25         STG: Pt reports she would like to learn how to set boundaries with her mother around religious beliefs (Progressing)  Start:  03/09/24    Expected End:  04/22/25       Goal Note     12/23/24: Patient reflected on changes to overall outlook and mental health related to her relationship with her mother citing improvements in her abilities to recognize generational trauma, give grace for grace as needed, and utilize assertive communication to establish boundaries as needed.         LTG: Address childhood trauma centered around religion. (Progressing)     Start:  03/09/24    Expected End:  04/22/25       Goal Note     12/23/24: Patient reflected on newfound understanding of implications related to her childhood trauma and ways in which she has  found ways to cope as an adult.  Reflected on controllable factors and continued pattern of choosing to except certain components of her past.         LTG - Misc 1 (Progressing)     Start:  03/09/24    Expected End:  04/22/25      Address PTSD related to all my medical stuff.       Provide Bricia with education on trauma-oriented therapy     Start:  03/09/24         Cooperate with trauma-focused psychotherapy techniques to reduce emotional reaction to the traumatic event      Start:  03/09/24         Teach Ashley Brandt coping strategies (e.g., writing down thoughts and feelings in a journal; taking deep, slow breaths; calling a support person to talk about memories) to deal with trauma memories and sudden emotional reactions without becoming emotionally n     Start:  03/09/24         Educate Ashley Brandt on trauma influenced cognitive distortions     Start:  03/09/24         Encourage Ashley Brandt to identify 2 trauma related cognitive distortions     Start:  03/09/24            ProgressTowards Goals: Progressing  Interventions: Supportive, Reframing, and Other: ACT  Summary: Ashley Brandt is a 54 y.o. female who presents with sxs to include but not limited to Change in energy/activity; Difficulty Concentrating; Fatigue; Hopelessness; Increase/decrease in appetite; Irritability; Tearfulness; Worthlessness; Restlessness; Sleep; Tension; Worrying; Irritability/anger; Guilt/shame. Pt was oriented times 5. Pt was cooperative and engaged. Pt denies SI/HI/AVH.   The patient reflected on the recent holiday season and expressed joy in her ability to spend quality time with her children. She also considered new pain management strategies and her efforts to cleanse her home, which she believes represents her life and her beliefs about the energy in her space. The patient discussed her experience with hormone therapy and believes it has helped her find pain relief and motivation. She  reports that the change in estrogen levels has improved her mood.  Additionally, she reflected on writing a no send letter to her mother. Since her last therapy appointment, she has spoken with her mother a few times and discussed their general interactions and the impact of generational trauma. The patient noted a level of acceptance regarding some of her mothers traits.  She examined Acceptance and Commitment Therapy (ACT) strategies that help her name and validate her feelings, enabling her to move on from difficult situations. The patient also reflected on establishing boundaries with her mother.  Lastly, she expressed a desire to return to journaling and other exercises that help her release internal thoughts  that cause distress and practice analyzing the truth of her thoughts.  Suicidal/Homicidal: Nowithout intent/plan  Therapist Response: Clinician utilized active and supportive reflection to create a safe space for patient to process recent life events. Clinician assessed for current stressors, symptoms, safety since last session. Educated patient on ACT activities to challenge the truthfulness of various cognitions. Explored overall impact to her MH in accepting her mother and redirecting her focus to controllable factors she can manage.   Plan: Return again in 2 weeks.  Diagnosis: (chronic) Adjustment disorder with anxious mood   Insomnia, unspecified type   Current mild episode of major depressive disorder, unspecified whether recurrent  Collaboration of Care: AEB psychiatrist can access notes and cln. Will review psychiatrists' notes. Check in with the patient and will see LCSW per availability. Patient agreed with treatment recommendations.   Patient/Guardian was advised Release of Information must be obtained prior to any record release in order to collaborate their care with an outside provider. Patient/Guardian was advised if they have not already done so to contact the  registration department to sign all necessary forms in order for us  to release information regarding their care.   Consent: Patient/Guardian gives verbal consent for treatment and assignment of benefits for services provided during this visit. Patient/Guardian expressed understanding and agreed to proceed.   Ashley KATHEE Husband, LCSW 12/23/2024  "

## 2024-12-27 ENCOUNTER — Encounter: Attending: Physical Medicine and Rehabilitation | Admitting: Physical Medicine and Rehabilitation

## 2024-12-27 VITALS — BP 108/76 | HR 66 | Ht 62.5 in | Wt 131.0 lb

## 2024-12-27 DIAGNOSIS — G5771 Causalgia of right lower limb: Secondary | ICD-10-CM | POA: Diagnosis not present

## 2024-12-27 DIAGNOSIS — G4701 Insomnia due to medical condition: Secondary | ICD-10-CM | POA: Diagnosis not present

## 2024-12-27 DIAGNOSIS — N951 Menopausal and female climacteric states: Secondary | ICD-10-CM | POA: Insufficient documentation

## 2024-12-27 DIAGNOSIS — F411 Generalized anxiety disorder: Secondary | ICD-10-CM | POA: Insufficient documentation

## 2024-12-27 NOTE — Progress Notes (Signed)
 "  Subjective:    Patient ID: Ashley Brandt, female    DOB: 01/19/1971, 54 y.o.   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 Elder -she follows with her own therapist after these sessions -rash improved from Butrans  patch PRIOR HISTORY: -she is not sure whether she may want to try a higher dose of the Butrans  patch -she has never tried Savella and would like to read about this before trying it -she had hallucinations with Lyrica  in the past PRIOR history: 54 year old female referred by her primary care physician for the evaluation of right foot and toe pain.  The patient states that she has 2 types of pain 1 that is on the bottom of the foot right around the area of the surgery the other area is more in the middle of the foot.  She really has few symptoms at the ankle or above.  She notes mild swelling in the right foot sometimes discoloration as well.  It is very hypersensitive to touch particularly on the plantar surface. 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  She was started on Lyrica  and had some side effects and did not wish to continue taking it.  The patient also had a behavioral health evaluation 04/02/2021 diagnosed with insomnia and adjustment disorder with mixed anxiety depressed mood.  She tried Ambien  and melatonin.  She has been on Cymbalta  30 mg up to 60 mg a day.  She is now on gabapentin  600 mg 3 times per day.  The patient has had a second opinion with orthopedic surgeon.  The patient has had an arterial Doppler on 04/23/2021 which was normal.  She is scheduling surgery at [facility] with a peripheral neurosurgeon with plans for resection of neuroma as well as wrapping the end of the neuroma with abdominal  epidermis to prevent reforming  -she asks whether Butrans  patch is enough to replace Nucynta since MME of latter was higher  -using the torsion foot in PT has been   -she asks about Butrans  patch, Lyrica   -pain has been severe recently  -she does not like the ketamine but she does feel that it helped her to walk, she feels dissociated for 1 week after- she felt disconnected from herself and her family  -she started walking and has found the torsion foot very helpful  -scrambler was ineffective  -her leg was really burning this morning  -she is taking Nucynta 50mg  TID, it is being prescribed by [facility]   -she has always been active and started to run a lot more during the pandemic. 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 She has had more pain and less function.   -she wanted a neurosurgeon to place her spinal cord stimulator  -she asks about compounding medication  CRPS has gotten considerably worse, she is currently 3 weeks post-op spinal cord stimulator placement, she does find it helps, but knows it will take time to get used it   -she currently feels a burning pain in her residual limb  -she had muscle reintigration  -has tried ketamine infusions before, she did not love  doing them but she did feel that they helped, she is not a fan of tripping and she did feel that when she was coming out of the infusion  -sometimes she feels a burning pain in her foot as well  Pain interferes with sleep at night  -prior to amputation she has tried bisphosphonate infusions, lumbar sympathetic nerve block, gabapentin , nortriptyline   -she underwent IO surgery and pain worsened after nerve block wore off -she is to start scrambler therapy next week -she will be in [place] for 2 weeks for scrambler therapy -she wants to wean off the gabapentin  but not at this time given her current pain levels -she has been  doing journaling and self compassion medication  She is scheduled to have a sympathetic nerve block 1 week from thursday  CLINICAL DATA:  Severe plantar foot pain since foot surgery 3-4 weeks ago for removal of Morton's neuroma (on 01/24/2021).   EXAM: MRI OF THE RIGHT FOREFOOT WITHOUT CONTRAST   TECHNIQUE: Multiplanar, multisequence MR imaging of the right forefoot was performed. No intravenous contrast was administered.   COMPARISON:  Office foot radiographs 11/12/2020. No previous MRI available.   FINDINGS: Bones/Joint/Cartilage   Mild degenerative changes of the 1st metatarsophalangeal joint with a small subchondral cyst in the 1st metatarsal head and a small joint effusion. No other significant arthropathic changes. There is no evidence of acute fracture, dislocation or avascular necrosis.   Ligaments   The Lisfranc ligament is intact. The collateral ligaments of the metatarsophalangeal joints appear intact.   Muscles and Tendons   There is focal susceptibility artifact within the plantar soft tissues at the level of the 2nd and 3rd metatarsal necks, presumably postsurgical. There is mild surrounding soft tissue edema as well as trace fluid within the flexor tendon sheaths of the 2nd and 3rd digits. The interosseous muscles are mildly edematous. There is additional ill-defined low signal between the 2nd and 3rd metatarsal necks, also likely postsurgical. No drainable fluid collection.   Soft tissues   The plantar soft tissues are deformed by a capsule which was placed along the plantar aspect of the 3rd metatarsal head. As above, there is underlying soft tissue edema of and presumed postsurgical susceptibility artifact. No drainable fluid collection or soft tissue mass identified.   IMPRESSION: 1. Nonspecific soft tissue edema and susceptibility artifact in the plantar forefoot soft tissues, attributed to recent surgery. Possible mild flexor tenosynovitis of  the 2nd and 3rd rays. 2. No drainable fluid collection. 3. First MTP joint degenerative changes and small effusion. No acute osseous findings.     Electronically Signed   By: Elsie Perone M.D.   On: 04/08/2021 09:41 Pain Inventory Average Pain 6 Pain Right Now 4 My pain is intermittent and constant burning and tingling, aching, tingling  In the last 24 hours, has pain interfered with the following? General activity 4 Relation with others 2 Enjoyment of life 3 What TIME of day is your pain at its worst? evening and night Sleep (in general) poor to Good  Pain is worse with: walking, sitting, and standing Pain improves with: rest, pacing activities, and medication Relief from Meds: 4  2) Hot flashes: -her last period was in June 2024 She has started having severe hot flashes -she had her estrogen levels checked and they were low -she has follow-up scheduled for potential hormone replacement therapy -she read that gabapentin  and nucynta can decreased estrogen -she had an appointment with a telehealth physician today to start hormone replacement therapy  and she is very excited about this -she had low levels of several hormones -she has been sleeping very poorly at night  Family History  Problem Relation Age of Onset   Depression Mother    Diabetes Mellitus II Mother    Heart Problems Father    Social History   Socioeconomic History   Marital status: Married    Spouse name: Not on file   Number of children: Not on file   Years of education: Not on file   Highest education level: Not on file  Occupational History   Not on file  Tobacco Use   Smoking status: Never   Smokeless tobacco: Never  Vaping Use   Vaping status: Never Used  Substance and Sexual Activity   Alcohol  use: Yes    Comment: social    Drug use: No   Sexual activity: Yes    Birth control/protection: Abstinence, None  Other Topics Concern   Not on file  Social History Narrative   Not on file    Social Drivers of Health   Tobacco Use: Low Risk (12/02/2024)   Patient History    Smoking Tobacco Use: Never    Smokeless Tobacco Use: Never    Passive Exposure: Not on file  Financial Resource Strain: Low Risk  (11/02/2023)   Received from [facility]   Overall Financial Resource Strain (CARDIA)    Difficulty of Paying Living Expenses: Not hard at all  Food Insecurity: No Food Insecurity (06/20/2024)   Received from [facility]   Food Insecurity    Within the past 12 months, the food you bought just didn't last and you didn't have money to get more.: Never true  Transportation Needs: No Transportation Needs (06/20/2024)   Received from [facility]   Transportation    In the past 12 months, has the lack of transportation kept you from medical appointments, meetings, work, or from getting things needed for daily living?: No  Physical Activity: Not on file  Stress: Not on file  Social Connections: Not on file  Depression (PHQ2-9): Low Risk (09/26/2024)   Depression (PHQ2-9)    PHQ-2 Score: 0  Recent Concern: Depression (PHQ2-9) - Medium Risk (09/19/2024)   Depression (PHQ2-9)    PHQ-2 Score: 7  Alcohol  Screen: Not on file  Housing: Low Risk  (11/02/2023)   Received from [facility]   Epic    In the last 12 months, was there a time when you were not able to pay the mortgage or rent on time?: No    In the past 12 months, how many times have you moved where you were living?: 0    At any time in the past 12 months, were you homeless or living in a shelter (including now)?: No  Utilities: No (06/20/2024)   Received from [facility]   Utilities    In the last 12 months, have you had trouble paying your electricity, gas, heating/cooling, or water bill?: No  Health Literacy: Low Risk (06/20/2024)   Received from [facility]   Health Literacy    How comfortable are you filling out  medical forms by yourself?: Extremely    How often do you need to have someone help you when you read instructions, pamphlets, or other written material from your doctor or pharmacy?: Never   Past Surgical History:  Procedure Laterality Date   BREAST REDUCTION SURGERY  2006   CESAREAN SECTION  CESAREAN SECTION  2000   Past Medical History:  Diagnosis Date   Allergy    BP 108/76   Pulse 66   Ht 5' 2.5 (1.588 m)   Wt 131 lb (59.4 kg)   SpO2 97%   BMI 23.58 kg/m   Opioid Risk Score:   Fall Risk Score:  `1  Depression screen [facility] 2/9     09/26/2024   11:01 AM 09/19/2024    1:29 PM 03/09/2024    1:11 PM 08/28/2023    9:56 AM 06/11/2021    2:53 PM 01/20/2016   11:27 AM  Depression screen PHQ 2/9  Decreased Interest 0   1 1 0  Down, Depressed, Hopeless 0   1 1 0  PHQ - 2 Score 0   2 2 0  Altered sleeping     1   Tired, decreased energy     1   Change in appetite     0   Feeling bad or failure about yourself      1   Trouble concentrating     1   Moving slowly or fidgety/restless     1   Suicidal thoughts     0   PHQ-9 Score     7    Difficult doing work/chores     Very difficult      Information is confidential and restricted. Go to Review Flowsheets to unlock data.   Data saved with a previous flowsheet row definition     Review of Systems  Musculoskeletal:  Positive for gait problem.       Foot pain  Neurological:  Positive for dizziness.  All other systems reviewed and are negative.      Objective:   Not performed       Assessment & Plan:   CRPS 2: -continue nortriptyline , change to night -continue baclofen  10mg  BID prn for muscle spasms -discussed savella and she would like to read about this option before trying it -discussed Norleen Ledger theory of repressed rage, that often chronic pain, depression, anxiety can occur more often in people who are very kind hearted, take on the concerns of others, and like to please others -recommended Dr. Toribio  Amen's new book on chronic pain -d/c cymbalta  -discussed that pain has improved since she started HRT and that the butrans  patch is also helping -discussed that she has bee doing 8,000 steps daily -discussed that one of her New Years Goals is to get off Nortriptyline  and to eventually wean off the gabapentin  -d/c nucynta, discussed that she is on low dose, takes twice per day, does not need to wean off -discussed that she is not sure whether she took the lyrica  long enough to give it a fair assessment  -discussed her desire to wean off gabapentin , discussed that she can try skipping morning 800mg  dose or I can send her 600mg  to take in the morning instead -prescribed Butrans  patch -discussed that she has read that gabapentin  and Nucynta can decrease estrogen levels  -discussed her pain, that she is on Nucynta, discussed that she has not tried long term opioid therapy or a pain pump, discussed that she has tried a vagal nerve stimulator  -discussed her pain, that she needs a new script for he compounded cream as hers has crystallized, discussed that she has weaned off the lorazepam , discussed that she has had benefit from dialudid in the past, discussed that she does use CBD products, discussed that she is interested in  following with [facility] and [facility] in case she needs ketamine infusions in the past  -discussed that Butrans  patch has not been helpful, discussed increasing dose but she defers at this time, discussed that she is experiencing rash from the patch but is is mild   -continue gabapentin    -discussed that she has had hallucinations in response to Lyrica  in the past  -discussed that she had spinal cord stimulator placed by [facility] 3 weeks ago, discussed risks of tolerance over time  -discussed Scrambler therapy, recommended that she discuss this with her neurosurgeon as well, discussed cost of the therapy  -discussed that she is pursuing osseointegration in [place]  -discussed risks and  benefits of ketamine troches and infusions -discussed that she has a spinal cord stimulator that was just reprogramed   -discussed that she had a flare this weekend  -discussed of her her CRPS, her flare this weekend, ketamine troches versus infusions, discussed that she does not want to go to [facility] in [place], discussed Qutenza, discussed it as a back-up option if surgery is ineffective  -discussed that she does not do well with steroids  -discussed that she has gastritis with NSAIDs  -discussed that Journavx  did not help  -discussed that Nucynta did not help, discussed that she hated Cymbalta  in the past  -discussed that she weaned down to 10mg  of the Pamelor   Prescribed Ketamine 20%, Baclofen  2%, Cyclobenzaprine 2%, Ketoprofen 10%, Gabapentin  20%, Bupivacaine  1%, Amitryptiline 5%, clonidine 0.2% compounded cream  Dispense 240 g  Apply 1-2 g to affected area 3-4 times per day, discussed that she used it for the first time last night  -discussed that the ketamine caused her to dissociate from her pain and family -discussed that she had 3 daily sessions of ketamine, discussed that she went to [place] because she could not find anyone in in this area.    -discussed mechanism of action of low dose naltrexone as an opioid receptor antagonist which stimulates your body's production of its own natural endogenous opioids, helping to decrease pain. Discussed that it can also decrease T cell response and thus be helpful in decreasing inflammation, and symptoms of brain fog, fatigue, anxiety, depression, and allergies. Discussed that this medication needs to be compounded at a compounding pharmacy and can more expensive. Discussed that I usually start at 1mg  and if this is not providing enough relief then I titrate upward on a monthly basis. 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 She recently underwent transtibial osseointegration surgery on 06/17/2024. Ashley Brandt was previously ambulatory with a traditional transtibial prosthesis, which is now 55 months old.  Her current prosthetic foot and alignable components no longer meet her functional needs following osseointegration. Unlike traditional socket suspension, osseointegration transfers load directly through the skeletal system, making the implant highly sensitive to repetitive vertical impact and rotational shear forces. Ashley Brandt existing foot cannot provide adequate vertical shock absorption or torsional rotation, resulting in increased discomfort, gait hesitancy, slower walking speed on uneven terrain, and reduced overall satisfaction and quality of life.  A vertical shock and torsion foot is medically necessary to protect the osseointegration implant by minimizing repetitive impact and rotational shear forces. These features will improve comfort, confidence, and mobility on uneven surfaces, directly supporting safe community ambulation. Updated alignable components are also required to fine-tune sagittal, coronal, and transverse alignment with the new foot, ensuring proper load  distribution and adaptability during rehabilitation. Together, these additions will restore functional independence, reduce implant stress, and enhance overall quality of life.  Ashley Brandt demonstrates the ability and potential for community ambulation with variable cadence, consistent with a K3 functional level. She lives independently in a multi-level home with stairs and ramps, performs household tasks, and intends to return to vocational and recreational activities requiring walking on uneven terrain. She has the cognitive ability, limb strength, range of motion, and cardiovascular health to safely use a transtibial prosthesis. A replacement foot with vertical shock and torsion features, along with updated alignable  components, is medically necessary to support her rehabilitation, reduce pain and skin breakdown, and promote safe, independent ambulation and participation in daily and community activities.  -discussed that she was wondering about [facility]  -Discussed Qutenza as an option for neuropathic pain control. Discussed that this is a capsaicin patch, stronger than capsaicin cream. Discussed that it is currently approved for diabetic peripheral neuropathy and post-herpetic neuralgia, but that it has also shown benefit in treating other forms of neuropathy. Provided patient with link to site to learn more about the patch: https://www.clark.biz/. Discussed that the patch would be placed in office and benefits usually last 3 months. Discussed that unintended exposure to capsaicin can cause severe irritation of eyes, mucous membranes, respiratory tract, and skin, but that Qutenza is a local treatment and does not have the systemic side effects of other nerve medications. Discussed that there may be pain, itching, erythema, and decreased sensory function associated with the application of Qutenza. Side effects usually subside within 1 week. A cold pack of analgesic medications can help with these side effects. Blood pressure can also be increased due to pain associated with administration of the patch.   -Provided with a pain relief journal and discussed that it contains foods and lifestyle tips to naturally help to improve pain. Discussed that these lifestyle strategies are also very good for health unlike some medications which can have negative side effects. Discussed that the act of keeping a journal can be therapeutic and helpful to realize patterns what helps to trigger and alleviate pain.     2) Anxiety: -discussed that this has improved with HRT -referred to psychiatry -prescribed buspar  -discussed that she has weaned off the lorazepam   3) Insomnia: -continue CBD gummies -continue  HRT -continue meditation -Try to go outside near sunrise -Get exercise during the day.  -Turn off all devices an hour before bedtime.  -Teas that can benefit: chamomile, valerian root, Brahmi (Bacopa) -Can consider over the counter melatonin, magnesium, and/or L-theanine. Melatonin is an anti-oxidant with multiple health benefits. Magnesium is involved in greater than 300 enzymatic reactions in the body and most of us  are deficient as our soil is often depleted. There are 7 different types of magnesium- Bioptemizer's is a supplement with all 7 types, and each has unique benefits. Magnesium can also help with constipation and anxiety.  -Pistachios naturally increase the production of melatonin -[employer] bamboo bed sheets are free from toxic chemicals.  -Tart cherry juice or a tart cherry supplement can improve sleep and soreness post-workout  4) PTSD: -discussed benefits of EMDR therapy with Christina Perkins   5) Hot flashes: -discussed that this has improved with HRT, continue -discussed her appointment for hormonal replacement therapy, that she was very pleased with the physician she worked with, that she is very hopeful that HRT not only improves her hot flashes but also her pain and sleep, discussed that I have  heard this to be the case for other patients on HRT -discussed that black cohosh, Effexor can be beneficial -discussed that her estrogen levels are low and she has f/u scheduled for potential hormone replacement therapy -discussed that she has read gabapentin  and nucynta can lower estroge -advised d/c of Nucynta since she does not find this helpful -advised possible wean of gabapentin  to 800mg  BID or 600/800/800mg  "

## 2024-12-30 ENCOUNTER — Encounter: Payer: Self-pay | Admitting: Physical Medicine and Rehabilitation

## 2025-01-02 ENCOUNTER — Ambulatory Visit (INDEPENDENT_AMBULATORY_CARE_PROVIDER_SITE_OTHER): Admitting: Licensed Clinical Social Worker

## 2025-01-02 DIAGNOSIS — F320 Major depressive disorder, single episode, mild: Secondary | ICD-10-CM | POA: Diagnosis not present

## 2025-01-02 DIAGNOSIS — G4700 Insomnia, unspecified: Secondary | ICD-10-CM

## 2025-01-02 DIAGNOSIS — F4322 Adjustment disorder with anxiety: Secondary | ICD-10-CM

## 2025-01-02 NOTE — Progress Notes (Signed)
 "  THERAPIST PROGRESS NOTE  Virtual Visit via Video Note  I connected with Ashley Brandt on 01/02/2025 at  3:00 PM EST by a video enabled telemedicine application and verified that I am speaking with the correct person using two identifiers.  Location: Patient: Address on file  Provider: Providers Address   I discussed the limitations of evaluation and management by telemedicine and the availability of in person appointments. The patient expressed understanding and agreed to proceed.  I discussed the assessment and treatment plan with the patient. The patient was provided an opportunity to ask questions and all were answered. The patient agreed with the plan and demonstrated an understanding of the instructions.   The patient was advised to call back or seek an in-person evaluation if the symptoms worsen or if the condition fails to improve as anticipated.  I provided 62 minutes of non-face-to-face time during this encounter.   Ashley Brandt Husband, LCSW   Session Time: 3-4:02pm  Participation Level: Active  Behavioral Response: CasualAlertEuthymicNeat, Wellgroomed   Type of Therapy: Individual Therapy  Treatment Goals addressed:  Active     Anxiety     LTG: Ashley Brandt will score less than 5 on the Generalized Anxiety Disorder 7 Scale (GAD-7)  (Progressing)     Start:  03/09/24    Expected End:  04/22/25       Goal Note     12/23/24: Patient reflected on various components of holistic and Western medical treatment options that have led to overall improved health and effectiveness of mental health medications.  Patient reports her overall mood has improved significantly in the past month.         STG: Ashley Brandt will reduce frequency of avoidant behaviors by 50% as evidenced by self-report in therapy sessions (Progressing)     Start:  03/09/24    Expected End:  04/22/25         Work with patient individually to identify the major components of a recent episode of anxiety:  physical symptoms, major thoughts and images, and major behaviors they experienced     Start:  03/09/24         Perform motivational interviewing regarding physical activity     Start:  03/09/24         Coping Skills      Start:  03/09/24       Will work with the pt using CBT/DBT techniques to help the pt verbalize an understanding of the cognitive, physiological, and behavioral components of anxiety and its treatment. This will be done by using worksheets, interactive activities, CBT/ABC thought logs, modeling, homework, role playing and journaling. Will work with pt to learn and implement coping skills that result in a reduction of anxiety and improve daily functioning per pt self-report 3 out of 5 documented sessions.         BH CCP Acute or Chronic Trauma Reaction     LTG: Elimination of maladaptive behaviors and thinking patterns which interfere with resolution of trauma as evidenced by self report (Progressing)     Start:  03/09/24    Expected End:  04/22/25         LTG: Recall traumatic events without becoming overwhelmed with negative emotions (Progressing)     Start:  03/09/24    Expected End:  04/22/25         STG: Pt reports she would like to learn how to set boundaries with her mother around religious beliefs (Progressing)     Start:  03/09/24    Expected End:  04/22/25       Goal Note     12/23/24: Patient reflected on changes to overall outlook and mental health related to her relationship with her mother citing improvements in her abilities to recognize generational trauma, give grace for grace as needed, and utilize assertive communication to establish boundaries as needed.         LTG: Address childhood trauma centered around religion. (Progressing)     Start:  03/09/24    Expected End:  04/22/25       Goal Note     12/23/24: Patient reflected on newfound understanding of implications related to her childhood trauma and ways in which she has found  ways to cope as an adult.  Reflected on controllable factors and continued pattern of choosing to except certain components of her past.         LTG - Misc 1 (Progressing)     Start:  03/09/24    Expected End:  04/22/25      Address PTSD related to all my medical stuff.       Provide Ashley Brandt with education on trauma-oriented therapy     Start:  03/09/24         Cooperate with trauma-focused psychotherapy techniques to reduce emotional reaction to the traumatic event      Start:  03/09/24         Teach Ashley Brandt coping strategies (e.g., writing down thoughts and feelings in a journal; taking deep, slow breaths; calling a support person to talk about memories) to deal with trauma memories and sudden emotional reactions without becoming emotionally n     Start:  03/09/24         Educate Ashley Brandt on trauma influenced cognitive distortions     Start:  03/09/24         Encourage Ashley Brandt to identify 2 trauma related cognitive distortions     Start:  03/09/24            ProgressTowards Goals: Progressing   Interventions: Supportive, Reframing, and Other: ACT   Summary: Ashley Brandt is a 54 y.o. female who presents with sxs to include but not limited to Change in energy/activity; Difficulty Concentrating; Fatigue; Hopelessness; Increase/decrease in appetite; Irritability; Tearfulness; Worthlessness; Restlessness; Sleep; Tension; Worrying; Irritability/anger; Guilt/shame. Pt was oriented times 5. Pt was cooperative and engaged. Pt denies SI/HI/AVH.   Reflected on pain management options and upcoming treatment.  Discussed the discovery of connections between religious trauma and mental health symptoms. Examined the nature versus nurture debate in relation to her anxiety. Explored the possibility of visiting her mother and brother in the coming months following successful pain management. Considered ways she can support her mother in moving to a retirement community and how  she can assist her mother through a journey of independence and self-discovery as they both navigate religious trauma.  Client signed the treatment plan based on the virtual meeting.  Suicidal/Homicidal: Nowithout intent/plan   Therapist Response: Clinician utilized active and supportive reflection to create a safe space for patient to process recent life events. Clinician assessed for current stressors, symptoms, safety since last session.  Clinician worked with patient in exploring connections between trauma and childhood experiences.  Worked with patient to draw new connections between generational trauma and messages based on surrounding/ time period. Clinician worked with patient to explore ways in which she can utilize assertive communication to foster a healthier connection with her mother.   Plan: Return  again in 2 weeks.   Diagnosis: (chronic) Adjustment disorder with anxious mood   Insomnia, unspecified type   Current mild episode of major depressive disorder, unspecified whether recurrent   Collaboration of Care: AEB psychiatrist can access notes and cln. Will review psychiatrists' notes. Check in with the patient and will see LCSW per availability. Patient agreed with treatment recommendations.     Patient/Guardian was advised Release of Information must be obtained prior to any record release in order to collaborate their care with an outside provider. Patient/Guardian was advised if they have not already done so to contact the registration department to sign all necessary forms in order for us  to release information regarding their care.   Consent: Patient/Guardian gives verbal consent for treatment and assignment of benefits for services provided during this visit. Patient/Guardian expressed understanding and agreed to proceed.   Ashley Brandt Husband, LCSW 01/02/2025  "

## 2025-01-06 ENCOUNTER — Other Ambulatory Visit: Payer: Self-pay | Admitting: Physical Medicine and Rehabilitation

## 2025-01-06 ENCOUNTER — Telehealth: Payer: Self-pay

## 2025-01-06 MED ORDER — BUPRENORPHINE 10 MCG/HR TD PTWK: 1.0000 | MEDICATED_PATCH | TRANSDERMAL | 0 refills | Status: AC

## 2025-01-06 NOTE — Telephone Encounter (Signed)
 Patient will need to change the [place] 10 MCG patch today. Will you please send in the refill to [facility] on [address]? Call back phone [phone].

## 2025-01-16 ENCOUNTER — Ambulatory Visit: Admitting: Licensed Clinical Social Worker

## 2025-01-16 DIAGNOSIS — F320 Major depressive disorder, single episode, mild: Secondary | ICD-10-CM

## 2025-01-16 DIAGNOSIS — G4700 Insomnia, unspecified: Secondary | ICD-10-CM | POA: Diagnosis not present

## 2025-01-16 DIAGNOSIS — F4322 Adjustment disorder with anxiety: Secondary | ICD-10-CM | POA: Diagnosis not present

## 2025-01-16 NOTE — Progress Notes (Signed)
 "  THERAPIST PROGRESS NOTE  Virtual Visit via Video Note  I connected with Ashley Brandt on 01/16/25 at  3:00 PM EST by a video enabled telemedicine application and verified that I am speaking with the correct person using two identifiers.  Location: Patient: Address on file  Provider: Providers Address   I discussed the limitations of evaluation and management by telemedicine and the availability of in person appointments. The patient expressed understanding and agreed to proceed.  I discussed the assessment and treatment plan with the patient. The patient was provided an opportunity to ask questions and all were answered. The patient agreed with the plan and demonstrated an understanding of the instructions.   The patient was advised to call back or seek an in-person evaluation if the symptoms worsen or if the condition fails to improve as anticipated.  I provided 49 minutes of non-face-to-face time during this encounter.   Ashley KATHEE Husband, LCSW   Session Time: 3pm-3:49pm  Participation Level: Active   Behavioral Response: CasualAlertEuthymicNeat, Wellgroomed    Type of Therapy: Individual Therapy  Treatment Goals addressed:  Active     Anxiety     LTG: Ashley Brandt will score less than 5 on the Generalized Anxiety Disorder 7 Scale (GAD-7)  (Progressing)     Start:  03/09/24    Expected End:  04/22/25       Goal Note     12/23/24: Patient reflected on various components of holistic and Western medical treatment options that have led to overall improved health and effectiveness of mental health medications.  Patient reports her overall mood has improved significantly in the past month.         STG: Ashley Brandt will reduce frequency of avoidant behaviors by 50% as evidenced by self-report in therapy sessions (Progressing)     Start:  03/09/24    Expected End:  04/22/25         Work with patient individually to identify the major components of a recent episode of  anxiety: physical symptoms, major thoughts and images, and major behaviors they experienced     Start:  03/09/24         Perform motivational interviewing regarding physical activity     Start:  03/09/24         Coping Skills      Start:  03/09/24       Will work with the pt using CBT/DBT techniques to help the pt verbalize an understanding of the cognitive, physiological, and behavioral components of anxiety and its treatment. This will be done by using worksheets, interactive activities, CBT/ABC thought logs, modeling, homework, role playing and journaling. Will work with pt to learn and implement coping skills that result in a reduction of anxiety and improve daily functioning per pt self-report 3 out of 5 documented sessions.         BH CCP Acute or Chronic Trauma Reaction     LTG: Elimination of maladaptive behaviors and thinking patterns which interfere with resolution of trauma as evidenced by self report (Progressing)     Start:  03/09/24    Expected End:  04/22/25         LTG: Recall traumatic events without becoming overwhelmed with negative emotions (Progressing)     Start:  03/09/24    Expected End:  04/22/25         STG: Pt reports she would like to learn how to set boundaries with her mother around religious beliefs (Progressing)  Start:  03/09/24    Expected End:  04/22/25       Goal Note     12/23/24: Patient reflected on changes to overall outlook and mental health related to her relationship with her mother citing improvements in her abilities to recognize generational trauma, give grace for grace as needed, and utilize assertive communication to establish boundaries as needed.         LTG: Address childhood trauma centered around religion. (Progressing)     Start:  03/09/24    Expected End:  04/22/25       Goal Note     12/23/24: Patient reflected on newfound understanding of implications related to her childhood trauma and ways in which she has  found ways to cope as an adult.  Reflected on controllable factors and continued pattern of choosing to except certain components of her past.         LTG - Misc 1 (Progressing)     Start:  03/09/24    Expected End:  04/22/25      Address PTSD related to all my medical stuff.       Provide Ashley Brandt with education on trauma-oriented therapy     Start:  03/09/24         Cooperate with trauma-focused psychotherapy techniques to reduce emotional reaction to the traumatic event      Start:  03/09/24         Teach Ashley Brandt coping strategies (e.g., writing down thoughts and feelings in a journal; taking deep, slow breaths; calling a support person to talk about memories) to deal with trauma memories and sudden emotional reactions without becoming emotionally n     Start:  03/09/24         Educate Ashley Brandt on trauma influenced cognitive distortions     Start:  03/09/24         Encourage Ashley Brandt to identify 2 trauma related cognitive distortions     Start:  03/09/24            ProgressTowards Goals: Progressing  Interventions: Solution Focused, Strength-based, and Supportive  Summary: Ashley Brandt is a 54 y.o. female who presents with sxs to include but not limited to Change in energy/activity; Difficulty Concentrating; Fatigue; Hopelessness; Increase/decrease in appetite; Irritability; Tearfulness; Worthlessness; Restlessness; Sleep; Tension; Worrying; Irritability/anger; Guilt/shame. Pt was oriented times 5. Pt was cooperative and engaged. Pt denies SI/HI/AVH.   The patient expressed frustration with the ketamine treatment due to experiencing dissociative effects. She reflected on non-traditional treatments that have helped with her mental health and pain management. We explored her new found understanding of derealization and depersonalization. She noted that she is dealing with complications from her last ketamine treatment and shared that she dislikes this experience. We  discussed the benefits of using grounding techniques to cope and identified some barriers to her treatment. She expressed concerns about her brain health in light of her ongoing medical treatments. Additionally, we explored Transcranial Magnetic Stimulation (TMS) as a treatment option, though she mentioned she has already discussed this with her doctor.  The patient identified feelings of hope regarding her treatment options, reflecting on how defeat might feel in this situation should things not go well.  With joy, she shared her self-care activities over the weekend and described how impactful they have been for her mood.   Furthermore, she reported progress since our last session, noting that her feelings of anger have been resolved in her relationship with her mother. Patient identified a series  of steps she is going to take this week to continue to progress her knowledge regarding her health and treatment options.   Suicidal/Homicidal: Nowithout intent/plan   Therapist Response: Clinician utilized active and supportive reflection to create a safe space for patient to process recent life events. Clinician assessed for current stressors, symptoms, safety since last session. Reflected on patient progress with coping and recognizing controllable factors to keep her grounded. Recalled action steps she can make to improve components of her situation.    Plan: Return again in 2 weeks.   Diagnosis: (chronic) Adjustment disorder with anxious mood   Insomnia, unspecified type   Current mild episode of major depressive disorder, unspecified whether recurrent   Collaboration of Care: AEB psychiatrist can access notes and cln. Will review psychiatrists' notes. Check in with the patient and will see LCSW per availability. Patient agreed with treatment recommendations.   Patient/Guardian was advised Release of Information must be obtained prior to any record release in order to collaborate their care  with an outside provider. Patient/Guardian was advised if they have not already done so to contact the registration department to sign all necessary forms in order for us  to release information regarding their care.   Consent: Patient/Guardian gives verbal consent for treatment and assignment of benefits for services provided during this visit. Patient/Guardian expressed understanding and agreed to proceed.   Ashley KATHEE Husband, LCSW 01/16/2025  "

## 2025-02-03 ENCOUNTER — Ambulatory Visit: Admitting: Licensed Clinical Social Worker

## 2025-02-17 ENCOUNTER — Ambulatory Visit: Admitting: Licensed Clinical Social Worker

## 2025-03-06 ENCOUNTER — Ambulatory Visit: Admitting: Licensed Clinical Social Worker
